# Patient Record
Sex: Female | Born: 1940 | Race: Black or African American | Hispanic: No | Marital: Married | Smoking: Former smoker
Health system: Southern US, Community
[De-identification: ages and names within clinical notes are randomized; demographics above are authoritative.]

## PROBLEM LIST (undated history)

## (undated) DIAGNOSIS — E119 Type 2 diabetes mellitus without complications: Secondary | ICD-10-CM

## (undated) DIAGNOSIS — R809 Proteinuria, unspecified: Secondary | ICD-10-CM

## (undated) DIAGNOSIS — Z923 Personal history of irradiation: Secondary | ICD-10-CM

## (undated) DIAGNOSIS — E7800 Pure hypercholesterolemia, unspecified: Secondary | ICD-10-CM

## (undated) DIAGNOSIS — C50919 Malignant neoplasm of unspecified site of unspecified female breast: Secondary | ICD-10-CM

## (undated) DIAGNOSIS — I10 Essential (primary) hypertension: Secondary | ICD-10-CM

## (undated) DIAGNOSIS — F329 Major depressive disorder, single episode, unspecified: Secondary | ICD-10-CM

## (undated) DIAGNOSIS — Z803 Family history of malignant neoplasm of breast: Secondary | ICD-10-CM

## (undated) DIAGNOSIS — E1140 Type 2 diabetes mellitus with diabetic neuropathy, unspecified: Secondary | ICD-10-CM

## (undated) DIAGNOSIS — Z800 Family history of malignant neoplasm of digestive organs: Secondary | ICD-10-CM

## (undated) DIAGNOSIS — R0600 Dyspnea, unspecified: Secondary | ICD-10-CM

## (undated) DIAGNOSIS — R600 Localized edema: Secondary | ICD-10-CM

## (undated) DIAGNOSIS — C801 Malignant (primary) neoplasm, unspecified: Secondary | ICD-10-CM

## (undated) DIAGNOSIS — M1990 Unspecified osteoarthritis, unspecified site: Secondary | ICD-10-CM

## (undated) DIAGNOSIS — F319 Bipolar disorder, unspecified: Secondary | ICD-10-CM

## (undated) DIAGNOSIS — G4730 Sleep apnea, unspecified: Secondary | ICD-10-CM

## (undated) DIAGNOSIS — F32A Depression, unspecified: Secondary | ICD-10-CM

## (undated) DIAGNOSIS — R609 Edema, unspecified: Secondary | ICD-10-CM

## (undated) HISTORY — DX: Family history of malignant neoplasm of breast: Z80.3

## (undated) HISTORY — DX: Malignant neoplasm of unspecified site of unspecified female breast: C50.919

## (undated) HISTORY — PX: COLONOSCOPY: SHX174

## (undated) HISTORY — DX: Family history of malignant neoplasm of digestive organs: Z80.0

## (undated) HISTORY — PX: KNEE SURGERY: SHX244

## (undated) HISTORY — DX: Bipolar disorder, unspecified: F31.9

## (undated) HISTORY — PX: ABDOMINAL HYSTERECTOMY: SHX81

---

## 2003-12-02 ENCOUNTER — Inpatient Hospital Stay (HOSPITAL_COMMUNITY): Admission: EM | Admit: 2003-12-02 | Discharge: 2003-12-04 | Payer: Self-pay | Admitting: Emergency Medicine

## 2005-08-19 IMAGING — CR DG CHEST 1V PORT
1 series · 1 of 1 positions shown · non-contrast
Comparison: none

CLINICAL DATA: Chest pain and shortness of breath.
 PORTABLE CHEST - 12/02/03 AT 2498 HOURS
 The lungs are clear.   The heart is within upper limits of normal.  No bony abnormality is seen.
 IMPRESSION
 No active lung disease.   Heart upper normal.

[view not recorded]
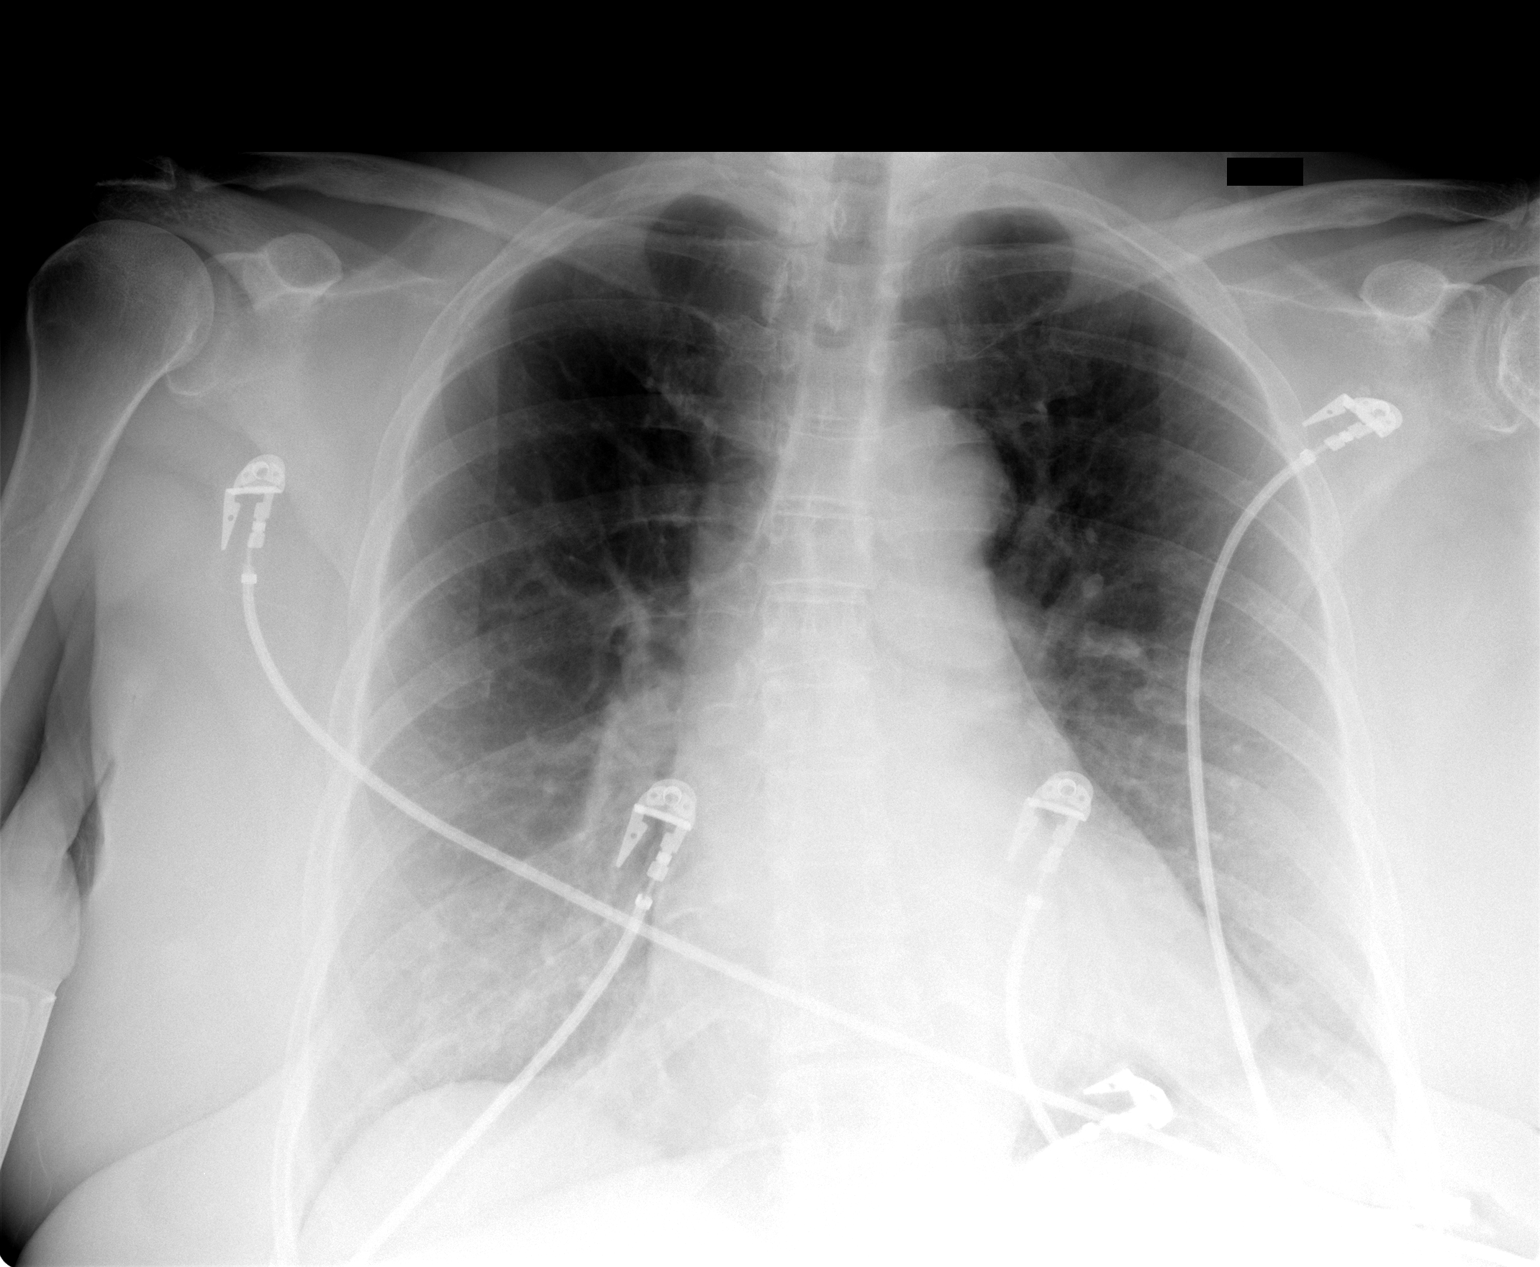

[1 of 1 positions shown; findings below may reference images not displayed]

## 2005-09-30 ENCOUNTER — Emergency Department (HOSPITAL_COMMUNITY): Admission: EM | Admit: 2005-09-30 | Discharge: 2005-09-30 | Payer: Self-pay | Admitting: Family Medicine

## 2012-04-13 ENCOUNTER — Emergency Department (HOSPITAL_BASED_OUTPATIENT_CLINIC_OR_DEPARTMENT_OTHER)
Admission: EM | Admit: 2012-04-13 | Discharge: 2012-04-13 | Disposition: A | Discharge status: Other Acute Inpatient Hospital | Payer: Medicare Other | Attending: Emergency Medicine | Admitting: Emergency Medicine

## 2012-04-13 ENCOUNTER — Emergency Department (HOSPITAL_BASED_OUTPATIENT_CLINIC_OR_DEPARTMENT_OTHER): Payer: Medicare Other

## 2012-04-13 ENCOUNTER — Encounter (HOSPITAL_BASED_OUTPATIENT_CLINIC_OR_DEPARTMENT_OTHER): Payer: Self-pay | Admitting: Emergency Medicine

## 2012-04-13 DIAGNOSIS — Z7982 Long term (current) use of aspirin: Secondary | ICD-10-CM | POA: Insufficient documentation

## 2012-04-13 DIAGNOSIS — R062 Wheezing: Secondary | ICD-10-CM | POA: Insufficient documentation

## 2012-04-13 DIAGNOSIS — I10 Essential (primary) hypertension: Secondary | ICD-10-CM | POA: Insufficient documentation

## 2012-04-13 DIAGNOSIS — E7800 Pure hypercholesterolemia, unspecified: Secondary | ICD-10-CM | POA: Insufficient documentation

## 2012-04-13 DIAGNOSIS — E1142 Type 2 diabetes mellitus with diabetic polyneuropathy: Secondary | ICD-10-CM | POA: Insufficient documentation

## 2012-04-13 DIAGNOSIS — R0789 Other chest pain: Secondary | ICD-10-CM | POA: Insufficient documentation

## 2012-04-13 DIAGNOSIS — R61 Generalized hyperhidrosis: Secondary | ICD-10-CM | POA: Insufficient documentation

## 2012-04-13 DIAGNOSIS — J40 Bronchitis, not specified as acute or chronic: Secondary | ICD-10-CM | POA: Insufficient documentation

## 2012-04-13 DIAGNOSIS — Z8679 Personal history of other diseases of the circulatory system: Secondary | ICD-10-CM | POA: Insufficient documentation

## 2012-04-13 DIAGNOSIS — J029 Acute pharyngitis, unspecified: Secondary | ICD-10-CM | POA: Insufficient documentation

## 2012-04-13 DIAGNOSIS — R5381 Other malaise: Secondary | ICD-10-CM | POA: Insufficient documentation

## 2012-04-13 DIAGNOSIS — R5383 Other fatigue: Secondary | ICD-10-CM | POA: Insufficient documentation

## 2012-04-13 DIAGNOSIS — E1149 Type 2 diabetes mellitus with other diabetic neurological complication: Secondary | ICD-10-CM | POA: Insufficient documentation

## 2012-04-13 DIAGNOSIS — Z79899 Other long term (current) drug therapy: Secondary | ICD-10-CM | POA: Insufficient documentation

## 2012-04-13 DIAGNOSIS — R079 Chest pain, unspecified: Secondary | ICD-10-CM

## 2012-04-13 HISTORY — DX: Type 2 diabetes mellitus with diabetic neuropathy, unspecified: E11.40

## 2012-04-13 HISTORY — DX: Localized edema: R60.0

## 2012-04-13 HISTORY — DX: Depression, unspecified: F32.A

## 2012-04-13 HISTORY — DX: Essential (primary) hypertension: I10

## 2012-04-13 HISTORY — DX: Proteinuria, unspecified: R80.9

## 2012-04-13 HISTORY — DX: Type 2 diabetes mellitus without complications: E11.9

## 2012-04-13 HISTORY — DX: Edema, unspecified: R60.9

## 2012-04-13 HISTORY — DX: Pure hypercholesterolemia, unspecified: E78.00

## 2012-04-13 HISTORY — DX: Major depressive disorder, single episode, unspecified: F32.9

## 2012-04-13 LAB — BASIC METABOLIC PANEL
CO2: 26 mEq/L (ref 19–32)
Creatinine, Ser: 0.7 mg/dL (ref 0.50–1.10)
GFR calc Af Amer: >90 mL/min (ref >90)
GFR calc non Af Amer: 86 mL/min — ABNORMAL LOW (ref >90)
Glucose, Bld: 235 mg/dL — ABNORMAL HIGH (ref 70–99)

## 2012-04-13 LAB — CBC
HCT: 42.5 % (ref 36.0–46.0)
Hemoglobin: 14.2 g/dL (ref 12.0–15.0)
MCH: 28.5 pg (ref 26.0–34.0)
MCV: 85.3 fL (ref 78.0–100.0)
Platelets: 207 10*3/uL (ref 150–400)

## 2012-04-13 LAB — TROPONIN I: Troponin I: <0.3 ng/mL (ref <0.30)

## 2012-04-13 LAB — PRO B NATRIURETIC PEPTIDE: Pro B Natriuretic peptide (BNP): 27.1 pg/mL (ref 0–125)

## 2012-04-13 MED ORDER — DEXTROSE 5 % IV SOLN
1.0000 g | Freq: Once | INTRAVENOUS | Status: AC
  Administered 2012-04-13: 1 g via INTRAVENOUS
  Filled 2012-04-13: qty 10

## 2012-04-13 MED ORDER — SODIUM CHLORIDE 0.9 % IV BOLUS (SEPSIS)
500.0000 mL | Freq: Once | INTRAVENOUS | Status: AC
  Administered 2012-04-13: 500 mL via INTRAVENOUS

## 2012-04-13 MED ORDER — ALBUTEROL SULFATE (5 MG/ML) 0.5% IN NEBU: 5.0000 mg | INHALATION_SOLUTION | Freq: Once | RESPIRATORY_TRACT | Status: DC

## 2012-04-13 MED ORDER — PREDNISONE 50 MG PO TABS
60.0000 mg | ORAL_TABLET | Freq: Once | ORAL | Status: AC
  Administered 2012-04-13: 60 mg via ORAL
  Filled 2012-04-13: qty 1

## 2012-04-13 MED ORDER — NITROGLYCERIN 0.4 MG SL SUBL
0.4000 mg | SUBLINGUAL_TABLET | SUBLINGUAL | Status: DC | PRN
  Filled 2012-04-13: qty 25

## 2012-04-13 MED ORDER — GUAIFENESIN-CODEINE 100-10 MG/5ML PO SYRP: 5.0000 mL | ORAL_SOLUTION | Freq: Three times a day (TID) | ORAL | Status: DC | PRN

## 2012-04-13 MED ORDER — DEXTROSE 5 % IV SOLN
500.0000 mg | Freq: Once | INTRAVENOUS | Status: AC
  Administered 2012-04-13: 500 mg via INTRAVENOUS
  Filled 2012-04-13: qty 500

## 2012-04-13 MED ORDER — AZITHROMYCIN 250 MG PO TABS: 250.0000 mg | ORAL_TABLET | Freq: Every day | ORAL | Status: DC

## 2012-04-13 MED ORDER — ALBUTEROL SULFATE HFA 108 (90 BASE) MCG/ACT IN AERS
2.0000 | INHALATION_SPRAY | RESPIRATORY_TRACT | Status: DC | PRN
  Administered 2012-04-13: 2 via RESPIRATORY_TRACT
  Filled 2012-04-13: qty 6.7

## 2012-04-13 MED ORDER — ASPIRIN 81 MG PO CHEW
162.0000 mg | CHEWABLE_TABLET | Freq: Once | ORAL | Status: AC
  Administered 2012-04-13: 162 mg via ORAL

## 2012-04-13 MED ORDER — ASPIRIN 81 MG PO CHEW
324.0000 mg | CHEWABLE_TABLET | Freq: Once | ORAL | Status: DC
  Filled 2012-04-13: qty 4

## 2012-04-13 MED ORDER — ALBUTEROL SULFATE (5 MG/ML) 0.5% IN NEBU
5.0000 mg | INHALATION_SOLUTION | Freq: Once | RESPIRATORY_TRACT | Status: AC
  Administered 2012-04-13: 5 mg via RESPIRATORY_TRACT
  Filled 2012-04-13: qty 1

## 2012-04-13 NOTE — ED Notes (Signed)
IV Azithromycin continues infusing.

## 2012-04-13 NOTE — ED Provider Notes (Addendum)
History     CSN: [id number]  Arrival date & time 04/13/12  1023   First MD Initiated Contact with Patient 04/13/12 1053      Chief Complaint  Patient presents with  . Chest Pain  . Sore Throat  . Cough    (Consider location/radiation/quality/duration/timing/severity/associated sxs/prior treatment) HPI Comments: Patient presents with tightness across her chest. She states that she's had sort typically throat since yesterday and a dry cough that started yesterday and then when she woke up this morning she had worsening cough. She also felt tight across her chest and felt short of breath. She did have one episode of diaphoresis. She says the pain is more on the left side that feels like a tight across her chest. She says it's a sharp pain when she breathes. This this morning she started coughing up some yellow sputum. She denies any nausea vomiting or diaphoresis. She denies any known fevers but she feels more fatigued today. She does feel achy all over.   Past Medical History  Diagnosis Date  . Diabetes mellitus without complication   . Hypercholesteremia   . Peripheral edema   . Proteinuria   . Diabetic neuropathy   . Depression   . Hypertension     Past Surgical History  Procedure Date  . Knee surgery   . Abdominal hysterectomy   . Colonoscopy     History reviewed. No pertinent family history.  History  Substance Use Topics  . Smoking status: Never Smoker   . Smokeless tobacco: Not on file  . Alcohol Use: No    OB History    Grav Para Term Preterm Abortions TAB SAB Ect Mult Living                  Review of Systems  Constitutional: Positive for diaphoresis and fatigue. Negative for fever and chills.  HENT: Positive for sore throat. Negative for congestion, rhinorrhea and sneezing.   Eyes: Negative.   Respiratory: Positive for cough, chest tightness and wheezing. Negative for shortness of breath.   Cardiovascular: Positive for chest pain. Negative for leg  swelling.  Gastrointestinal: Negative for nausea, vomiting, abdominal pain, diarrhea and blood in stool.  Genitourinary: Negative for frequency, hematuria, flank pain and difficulty urinating.  Musculoskeletal: Negative for back pain and arthralgias.  Skin: Negative for rash.  Neurological: Negative for dizziness, speech difficulty, weakness, numbness and headaches.    Allergies  Review of patient's allergies indicates no known allergies.  Home Medications   Current Outpatient Rx  Name  Route  Sig  Dispense  Refill  . AMANTADINE HCL 100 MG PO CAPS   Oral   Take 100 mg by mouth 2 (two) times daily.         . ASPIRIN 81 MG PO TABS   Oral   Take 81 mg by mouth daily.         [name] CLONAZEPAM 0.5 MG PO TABS   Oral   Take 0.5 mg by mouth at bedtime as needed.         . FUROSEMIDE 40 MG PO TABS   Oral   Take 40 mg by mouth daily.         [name] GABAPENTIN 600 MG PO TABS   Oral   Take 600 mg by mouth 3 (three) times daily.         [name] LISINOPRIL 2.5 MG PO TABS   Oral   Take 2.5 mg by mouth daily.         [name]  METFORMIN HCL 500 MG PO TABS   Oral   Take 500 mg by mouth 2 (two) times daily with a meal.         . MIRTAZAPINE 15 MG PO TABS   Oral   Take 15 mg by mouth at bedtime.         [name] PROPRANOLOL HCL 10 MG PO TABS   Oral   Take 10 mg by mouth 3 (three) times daily.         [name] SIMVASTATIN 40 MG PO TABS   Oral   Take 40 mg by mouth every evening.         [name] ZOLPIDEM TARTRATE 10 MG PO TABS   Oral   Take 10 mg by mouth at bedtime as needed.         . AZITHROMYCIN 250 MG PO TABS   Oral   Take 1 tablet (250 mg total) by mouth daily. Take first 2 tablets together, then 1 every day until finished.   6 tablet   0   . GUAIFENESIN-CODEINE 100-10 MG/5ML PO SYRP   Oral   Take 5 mLs by mouth 3 (three) times daily as needed for cough.   120 mL   0     BP 97/64  Pulse 73  Temp 98.1 F (36.7 C) (Oral)  Resp 18  SpO2 98%  Physical Exam  Constitutional:  She is oriented to person, place, and time. She appears well-developed and well-nourished.  HENT:  Head: Normocephalic and atraumatic.  Right Ear: External ear normal.  Left Ear: External ear normal.  Mouth/Throat: Oropharynx is clear and moist.  Eyes: Pupils are equal, round, and reactive to light.  Neck: Normal range of motion. Neck supple.  Cardiovascular: Normal rate, regular rhythm and normal heart sounds.   Pulmonary/Chest: Effort normal. No respiratory distress. She has wheezes (Mild excretory wheezes on exam). She has no rales. She exhibits tenderness (Positive reproducible tenderness left greater than right).  Abdominal: Soft. Bowel sounds are normal. There is no tenderness. There is no rebound and no guarding.  Musculoskeletal: Normal range of motion. She exhibits no edema.  Lymphadenopathy:    She has no cervical adenopathy.  Neurological: She is alert and oriented to person, place, and time.  Skin: Skin is warm and dry. No rash noted.  Psychiatric: She has a normal mood and affect.    ED Course  Procedures (including critical care time)  Results for orders placed during the hospital encounter of 04/13/12  CBC      Component Value Range   WBC 6.4  4.0 - 10.5 K/uL   RBC 4.98  3.87 - 5.11 MIL/uL   Hemoglobin 14.2  12.0 - 15.0 g/dL   HCT 40.9  81.1 - 91.4 %   MCV 85.3  78.0 - 100.0 fL   MCH 28.5  26.0 - 34.0 pg   MCHC 33.4  30.0 - 36.0 g/dL   RDW 78.2  95.6 - 21.3 %   Platelets 207  150 - 400 K/uL  BASIC METABOLIC PANEL      Component Value Range   Sodium 138  135 - 145 mEq/L   Potassium 4.2  3.5 - 5.1 mEq/L   Chloride 98  96 - 112 mEq/L   CO2 26  19 - 32 mEq/L   Glucose, Bld 235 (*) 70 - 99 mg/dL   BUN 12  6 - 23 mg/dL   Creatinine, Ser 0.86  0.50 - 1.10 mg/dL   Calcium 9.8  8.4 - 10.5 mg/dL   GFR calc non Af Amer 86 (*) >90 mL/min   GFR calc Af Amer >90  >90 mL/min  TROPONIN I      Component Value Range   Troponin I <0.30  <0.30 ng/mL  PRO B NATRIURETIC  PEPTIDE      Component Value Range   Pro B Natriuretic peptide (BNP) 27.1  0 - 125 pg/mL  D-DIMER, QUANTITATIVE      Component Value Range   D-Dimer, Quant 0.46  0.00 - 0.48 ug/mL-FEU  TROPONIN I      Component Value Range   Troponin I <0.30  <0.30 ng/mL  TROPONIN I      Component Value Range   Troponin I <0.30  <0.30 ng/mL   Dg Chest 2 View  04/13/2012  *RADIOLOGY REPORT*  Clinical Data: Cough  CHEST - 2 VIEW  Comparison: 12/02/2003  Findings: Lungs are hypoaerated with bibasilar curvilinear opacities, likely atelectasis.  No pleural effusion.  Heart size mildly enlarged.  No acute osseous finding.  IMPRESSION: Low lung volumes with curvilinear bibasilar airspace opacities, likely atelectasis.  This could obscure underlying pneumonia. If the patient's symptoms continue, consider PA and lateral chest radiographs obtained at full inspiration when the patient is clinically able.   Original Report Authenticated By: Christiana Pellant, M.D.      Dg Chest 2 View  04/13/2012  *RADIOLOGY REPORT*  Clinical Data: Cough  CHEST - 2 VIEW  Comparison: 12/02/2003  Findings: Lungs are hypoaerated with bibasilar curvilinear opacities, likely atelectasis.  No pleural effusion.  Heart size mildly enlarged.  No acute osseous finding.  IMPRESSION: Low lung volumes with curvilinear bibasilar airspace opacities, likely atelectasis.  This could obscure underlying pneumonia. If the patient's symptoms continue, consider PA and lateral chest radiographs obtained at full inspiration when the patient is clinically able.   Original Report Authenticated By: Christiana Pellant, M.D.     Date: 04/13/2012  Rate: 71  Rhythm: normal sinus rhythm  QRS Axis: normal  Intervals: normal  ST/T Wave abnormalities: nonspecific ST/T changes  Conduction Disutrbances:none  Narrative Interpretation:   Old EKG Reviewed: unchanged     1. Bronchitis   2. Chest pain       MDM  Patient with likely bronchitis. Cannot completely exclude  her lower lobe pneumonia based on chest x-ray. I feel her chest pain is related to this. She now says it's worse when she's coughing. I have a low suspicion for acute coronary syndrome. There is no other symptoms suggestive of pulmonary embolus. She states that her breathing does feel better. Her oxygen levels have remained normal. Her blood pressures been running between 90 and 110. She was given some IV fluids and overall is feeling much better. I will give her a prescription for Zithromax as well as cough syrup and she was dispensed an albuterol inhaler to use at home. She is from [place] and I advised her to followup with her doctor within the next 2 days. She is planning on going home tomorrow. Advised to return here if her symptoms worsen.   15:53: On walking patient her oxygen levels into the mid 80s. I will go and give her another albuterol nebulizer treatment as well as IV antibiotics and consult hospitalist for admission to [facility].  15:57 pt has decided that she wants to go to [facility].  Discussed with DR Susie Cassette who has accepted pt   Rolan Bucco, MD 04/13/12 1531  Rolan Bucco, MD 04/13/12 1553  Rolan Bucco,  MD 04/13/12 1557  Rolan Bucco, MD 04/13/12 [id number]

## 2012-04-13 NOTE — ED Notes (Signed)
Pt. States she took 2 baby ASA at home PTA.

## 2012-04-13 NOTE — ED Notes (Signed)
Room 608 assigned and ready.

## 2012-04-13 NOTE — ED Notes (Signed)
No breathing tx given at this time due to Albuterol MDI given.

## 2012-04-13 NOTE — ED Notes (Signed)
Report called to Nedra Hai, RN on [address] [place] at [facility].

## 2012-04-13 NOTE — ED Notes (Signed)
Pt having sore throat and dry cough for a few days.  Woke up this am with chest tightness, difficulty taking a deep breath, and dizziness.  Some diaphoresis, no N/V/D.  No known fever.

## 2012-04-13 NOTE — ED Notes (Signed)
Called [facility] for unassigned at [facility]

## 2013-12-30 IMAGING — CR DG CHEST 2V
2 series · 2 of 2 positions shown · non-contrast
Comparison: 12/02/2003

CLINICAL DATA: Cough

CHEST - 2 VIEW

[w chest pa]
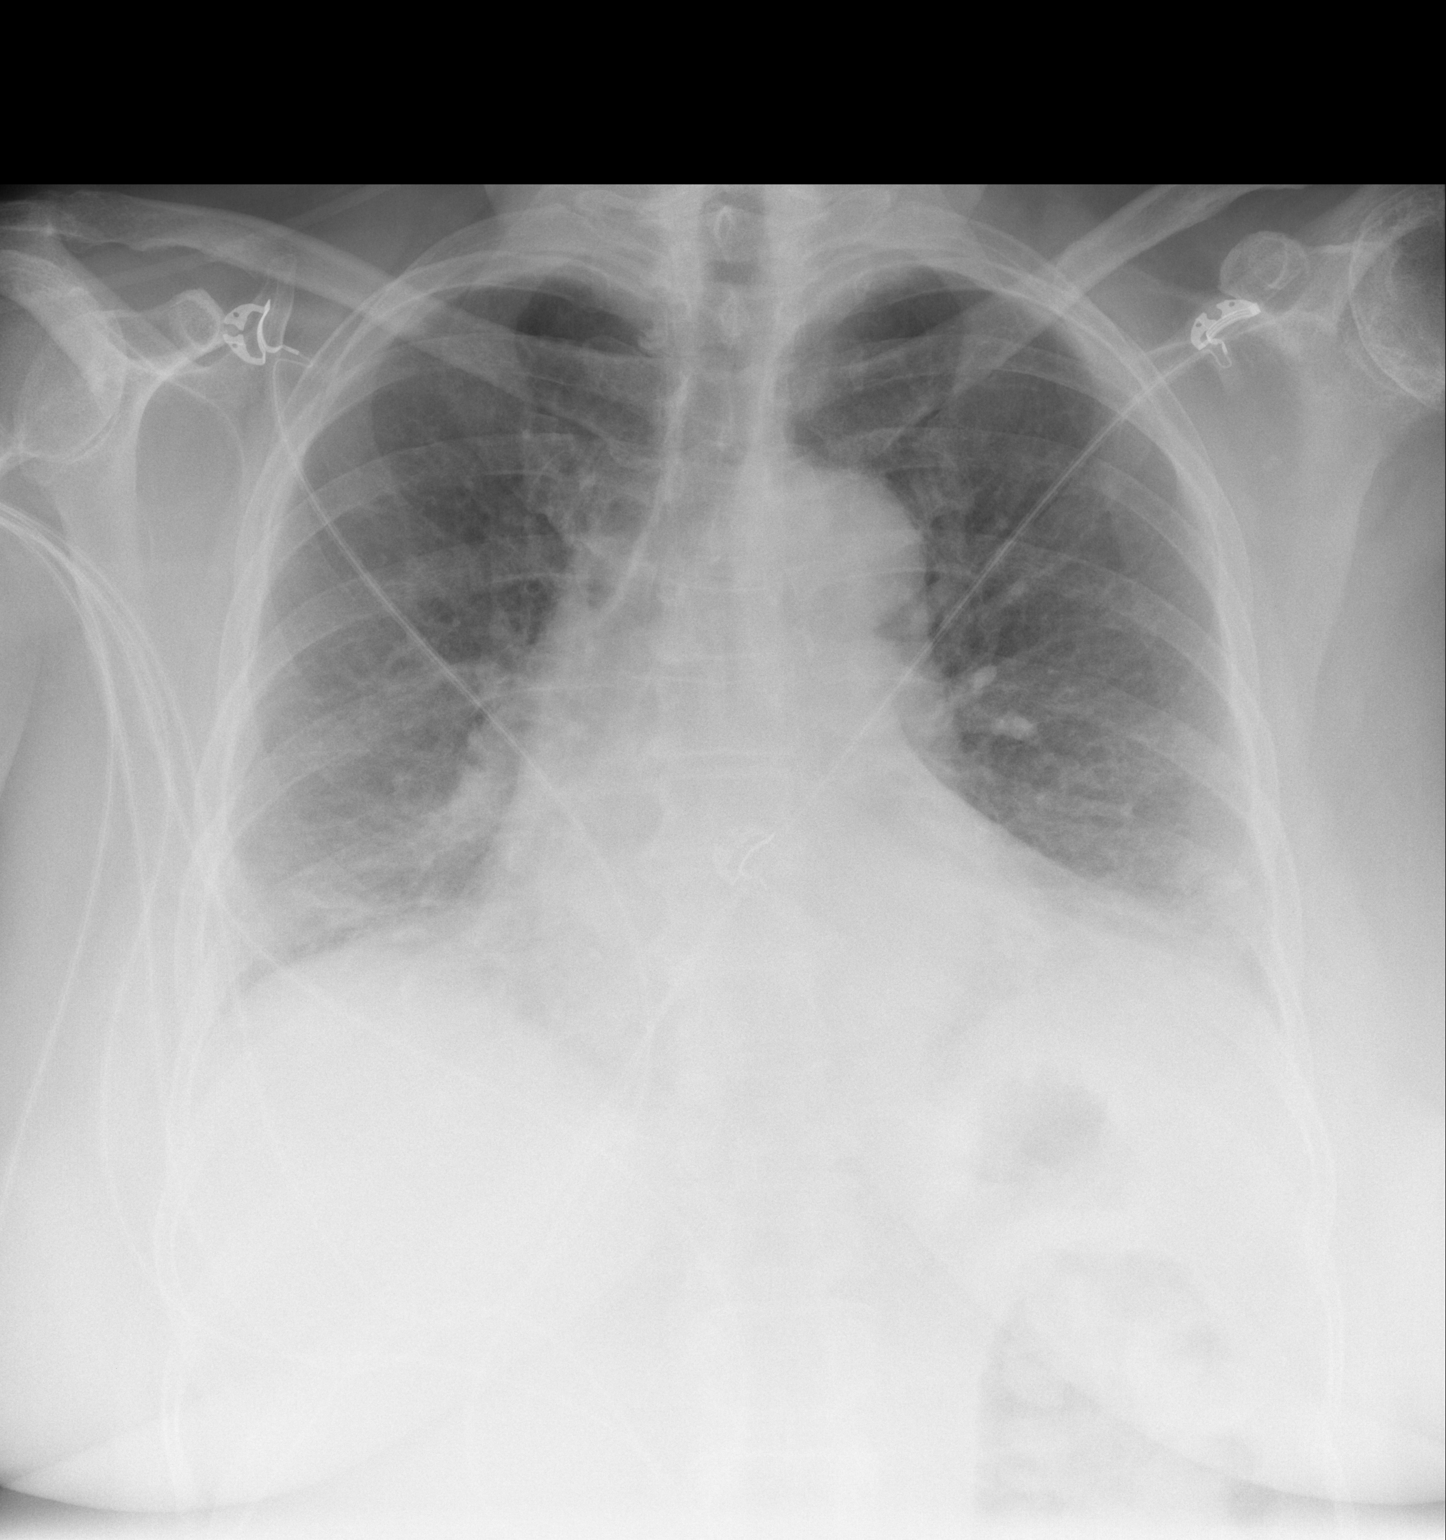

[w chest lat]
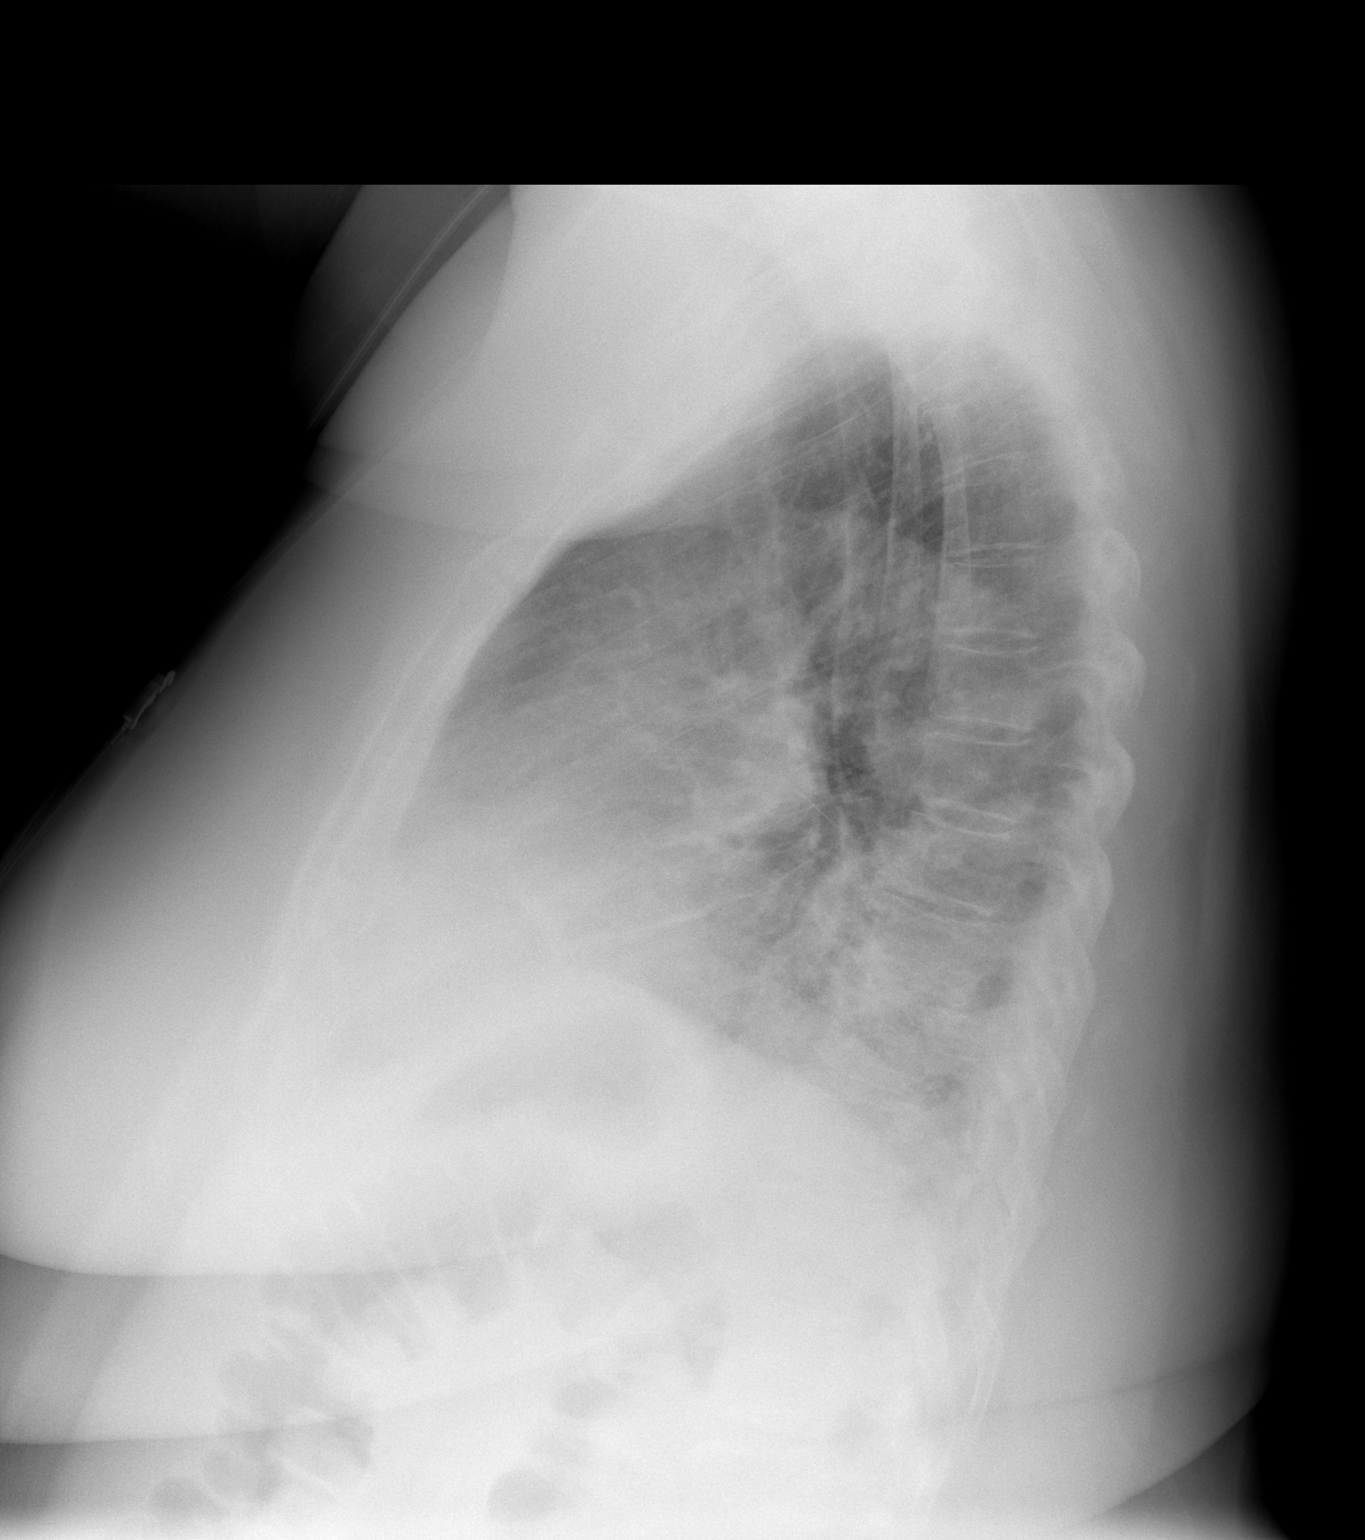

[2 of 2 positions shown; findings below may reference images not displayed]

FINDINGS: Lungs are hypoaerated with bibasilar curvilinear
opacities, likely atelectasis.  No pleural effusion.  Heart size
mildly enlarged.  No acute osseous finding.
IMPRESSION: Low lung volumes with curvilinear bibasilar airspace opacities,
likely atelectasis.  This could obscure underlying pneumonia. If
the patient's symptoms continue, consider PA and lateral chest
radiographs obtained at full inspiration when the patient is
clinically able.

## 2015-04-15 DIAGNOSIS — F331 Major depressive disorder, recurrent, moderate: Secondary | ICD-10-CM | POA: Diagnosis not present

## 2015-04-15 DIAGNOSIS — F29 Unspecified psychosis not due to a substance or known physiological condition: Secondary | ICD-10-CM | POA: Diagnosis not present

## 2015-04-15 DIAGNOSIS — F419 Anxiety disorder, unspecified: Secondary | ICD-10-CM | POA: Diagnosis not present

## 2015-04-24 DIAGNOSIS — M5441 Lumbago with sciatica, right side: Secondary | ICD-10-CM | POA: Diagnosis not present

## 2015-04-24 DIAGNOSIS — R109 Unspecified abdominal pain: Secondary | ICD-10-CM | POA: Diagnosis not present

## 2015-04-24 DIAGNOSIS — M79604 Pain in right leg: Secondary | ICD-10-CM | POA: Diagnosis not present

## 2015-04-24 DIAGNOSIS — F319 Bipolar disorder, unspecified: Secondary | ICD-10-CM | POA: Diagnosis not present

## 2015-04-24 DIAGNOSIS — M79651 Pain in right thigh: Secondary | ICD-10-CM | POA: Diagnosis not present

## 2015-04-24 DIAGNOSIS — M5431 Sciatica, right side: Secondary | ICD-10-CM | POA: Diagnosis not present

## 2015-04-24 DIAGNOSIS — Z79899 Other long term (current) drug therapy: Secondary | ICD-10-CM | POA: Diagnosis not present

## 2015-04-24 DIAGNOSIS — E119 Type 2 diabetes mellitus without complications: Secondary | ICD-10-CM | POA: Diagnosis not present

## 2015-04-27 DIAGNOSIS — R202 Paresthesia of skin: Secondary | ICD-10-CM | POA: Diagnosis not present

## 2015-04-27 DIAGNOSIS — E119 Type 2 diabetes mellitus without complications: Secondary | ICD-10-CM | POA: Diagnosis not present

## 2015-04-27 DIAGNOSIS — M545 Low back pain: Secondary | ICD-10-CM | POA: Diagnosis not present

## 2015-05-11 DIAGNOSIS — M6281 Muscle weakness (generalized): Secondary | ICD-10-CM | POA: Diagnosis not present

## 2015-05-11 DIAGNOSIS — E669 Obesity, unspecified: Secondary | ICD-10-CM | POA: Diagnosis not present

## 2015-05-11 DIAGNOSIS — E119 Type 2 diabetes mellitus without complications: Secondary | ICD-10-CM | POA: Diagnosis not present

## 2015-05-13 DIAGNOSIS — F419 Anxiety disorder, unspecified: Secondary | ICD-10-CM | POA: Diagnosis not present

## 2015-05-13 DIAGNOSIS — F331 Major depressive disorder, recurrent, moderate: Secondary | ICD-10-CM | POA: Diagnosis not present

## 2015-05-13 DIAGNOSIS — F29 Unspecified psychosis not due to a substance or known physiological condition: Secondary | ICD-10-CM | POA: Diagnosis not present

## 2015-05-19 DIAGNOSIS — I10 Essential (primary) hypertension: Secondary | ICD-10-CM | POA: Diagnosis not present

## 2015-05-19 DIAGNOSIS — E669 Obesity, unspecified: Secondary | ICD-10-CM | POA: Diagnosis not present

## 2015-05-19 DIAGNOSIS — M159 Polyosteoarthritis, unspecified: Secondary | ICD-10-CM | POA: Diagnosis not present

## 2015-05-19 DIAGNOSIS — E119 Type 2 diabetes mellitus without complications: Secondary | ICD-10-CM | POA: Diagnosis not present

## 2015-05-24 DIAGNOSIS — M5441 Lumbago with sciatica, right side: Secondary | ICD-10-CM | POA: Diagnosis not present

## 2015-05-24 DIAGNOSIS — M545 Low back pain: Secondary | ICD-10-CM | POA: Diagnosis not present

## 2015-05-25 DIAGNOSIS — R0602 Shortness of breath: Secondary | ICD-10-CM | POA: Diagnosis not present

## 2015-05-31 DIAGNOSIS — M5441 Lumbago with sciatica, right side: Secondary | ICD-10-CM | POA: Diagnosis not present

## 2015-05-31 DIAGNOSIS — M545 Low back pain: Secondary | ICD-10-CM | POA: Diagnosis not present

## 2015-06-02 DIAGNOSIS — M545 Low back pain: Secondary | ICD-10-CM | POA: Diagnosis not present

## 2015-06-02 DIAGNOSIS — M5441 Lumbago with sciatica, right side: Secondary | ICD-10-CM | POA: Diagnosis not present

## 2015-06-06 DIAGNOSIS — M4806 Spinal stenosis, lumbar region: Secondary | ICD-10-CM | POA: Diagnosis not present

## 2015-06-06 DIAGNOSIS — M4310 Spondylolisthesis, site unspecified: Secondary | ICD-10-CM | POA: Diagnosis not present

## 2015-06-06 DIAGNOSIS — M5416 Radiculopathy, lumbar region: Secondary | ICD-10-CM | POA: Diagnosis not present

## 2015-06-07 DIAGNOSIS — M5441 Lumbago with sciatica, right side: Secondary | ICD-10-CM | POA: Diagnosis not present

## 2015-06-07 DIAGNOSIS — M545 Low back pain: Secondary | ICD-10-CM | POA: Diagnosis not present

## 2015-06-08 DIAGNOSIS — E119 Type 2 diabetes mellitus without complications: Secondary | ICD-10-CM | POA: Diagnosis not present

## 2015-06-08 DIAGNOSIS — M4800 Spinal stenosis, site unspecified: Secondary | ICD-10-CM | POA: Diagnosis not present

## 2015-06-08 DIAGNOSIS — M5489 Other dorsalgia: Secondary | ICD-10-CM | POA: Diagnosis not present

## 2015-06-08 DIAGNOSIS — M159 Polyosteoarthritis, unspecified: Secondary | ICD-10-CM | POA: Diagnosis not present

## 2015-06-09 DIAGNOSIS — M545 Low back pain: Secondary | ICD-10-CM | POA: Diagnosis not present

## 2015-06-09 DIAGNOSIS — M5441 Lumbago with sciatica, right side: Secondary | ICD-10-CM | POA: Diagnosis not present

## 2015-06-10 DIAGNOSIS — F419 Anxiety disorder, unspecified: Secondary | ICD-10-CM | POA: Diagnosis not present

## 2015-06-10 DIAGNOSIS — F29 Unspecified psychosis not due to a substance or known physiological condition: Secondary | ICD-10-CM | POA: Diagnosis not present

## 2015-06-10 DIAGNOSIS — F331 Major depressive disorder, recurrent, moderate: Secondary | ICD-10-CM | POA: Diagnosis not present

## 2015-08-05 DIAGNOSIS — F419 Anxiety disorder, unspecified: Secondary | ICD-10-CM | POA: Diagnosis not present

## 2015-08-05 DIAGNOSIS — F333 Major depressive disorder, recurrent, severe with psychotic symptoms: Secondary | ICD-10-CM | POA: Diagnosis not present

## 2015-09-09 DIAGNOSIS — G2401 Drug induced subacute dyskinesia: Secondary | ICD-10-CM | POA: Diagnosis not present

## 2015-09-09 DIAGNOSIS — T43505S Adverse effect of unspecified antipsychotics and neuroleptics, sequela: Secondary | ICD-10-CM | POA: Diagnosis not present

## 2015-09-09 DIAGNOSIS — M5430 Sciatica, unspecified side: Secondary | ICD-10-CM | POA: Diagnosis not present

## 2015-10-28 ENCOUNTER — Ambulatory Visit: Payer: Medicare Other | Admitting: Medical

## 2015-10-28 DIAGNOSIS — F419 Anxiety disorder, unspecified: Secondary | ICD-10-CM | POA: Diagnosis not present

## 2015-10-28 DIAGNOSIS — F333 Major depressive disorder, recurrent, severe with psychotic symptoms: Secondary | ICD-10-CM | POA: Diagnosis not present

## 2015-11-23 ENCOUNTER — Telehealth: Payer: Self-pay | Admitting: Behavioral Health

## 2015-11-23 NOTE — Telephone Encounter (Signed)
Unable to reach patient for Pre-Visit call due to invalid number.

## 2015-11-24 ENCOUNTER — Encounter: Payer: Self-pay | Admitting: Family Medicine

## 2015-11-24 ENCOUNTER — Ambulatory Visit (INDEPENDENT_AMBULATORY_CARE_PROVIDER_SITE_OTHER): Payer: Medicare Other | Admitting: Family Medicine

## 2015-11-24 VITALS — BP 110/68 | HR 84 | Temp 98.5°F | Ht 67.0 in | Wt 269.8 lb

## 2015-11-24 DIAGNOSIS — R609 Edema, unspecified: Secondary | ICD-10-CM | POA: Diagnosis not present

## 2015-11-24 DIAGNOSIS — E1165 Type 2 diabetes mellitus with hyperglycemia: Secondary | ICD-10-CM

## 2015-11-24 DIAGNOSIS — F3170 Bipolar disorder, currently in remission, most recent episode unspecified: Secondary | ICD-10-CM | POA: Insufficient documentation

## 2015-11-24 DIAGNOSIS — R062 Wheezing: Secondary | ICD-10-CM | POA: Insufficient documentation

## 2015-11-24 DIAGNOSIS — IMO0001 Reserved for inherently not codable concepts without codable children: Secondary | ICD-10-CM | POA: Insufficient documentation

## 2015-11-24 DIAGNOSIS — E1142 Type 2 diabetes mellitus with diabetic polyneuropathy: Secondary | ICD-10-CM | POA: Diagnosis not present

## 2015-11-24 DIAGNOSIS — G8929 Other chronic pain: Secondary | ICD-10-CM

## 2015-11-24 DIAGNOSIS — M549 Dorsalgia, unspecified: Secondary | ICD-10-CM | POA: Diagnosis not present

## 2015-11-24 DIAGNOSIS — E118 Type 2 diabetes mellitus with unspecified complications: Secondary | ICD-10-CM

## 2015-11-24 DIAGNOSIS — E785 Hyperlipidemia, unspecified: Secondary | ICD-10-CM

## 2015-11-24 MED ORDER — METFORMIN HCL 500 MG PO TABS: 500.0000 mg | ORAL_TABLET | Freq: Two times a day (BID) | ORAL | 6 refills | Status: DC

## 2015-11-24 MED ORDER — FUROSEMIDE 40 MG PO TABS: 40.0000 mg | ORAL_TABLET | Freq: Every day | ORAL | 6 refills | Status: DC

## 2015-11-24 MED ORDER — SIMVASTATIN 40 MG PO TABS: 40.0000 mg | ORAL_TABLET | Freq: Every evening | ORAL | 6 refills | Status: DC

## 2015-11-24 MED ORDER — ALBUTEROL SULFATE HFA 108 (90 BASE) MCG/ACT IN AERS: 2.0000 | INHALATION_SPRAY | Freq: Four times a day (QID) | RESPIRATORY_TRACT | 6 refills | Status: AC | PRN

## 2015-11-24 NOTE — Patient Instructions (Signed)
Please go to lab for some basic blood tests today I'll be in touch with your results asap I will also refer you to someone to try and help with your back  Please plan to see me in 3-4 months for a recheck

## 2015-11-24 NOTE — Progress Notes (Addendum)
[place] at [facility] [address] [place], [place] [address] [phone]  Date:  11/24/2015   Name:  Rhonda Berg   DOB:  15-Jul-1940   MRN:  [id number]  PCP:  Lamar Blinks, MD    Chief Complaint: Establish Care (Pt here to est care. Pt has ongoing back pain for several years and is getting worse. Pt has several medications that need refills. )   History of Present Illness:  Rhonda Berg is a 75 y.o. very pleasant female patient who presents with the following:  Here today as a new patient- she has been getting her care and living in [place].  They moved here a few months ago as her husband got sick and had several MIs.  They are living with their daughter, and she does not think she will be going home anytime soon. Her husband has been getting his care through [facility] and is improving  History of DM, neuropathy, HTN, chronic back pain  Dx with DM about 3 years ago. It is "up down up down," she is not sure what her most recent A1c looked like.  She is on metformin 500 BID. Stopped taking invokana as it caused numbness in her feet.  She is not taking glimepiride either.  Does not check her blood sugar at home  She needs RF of a few medications today  States that she has had several MRI of her back, and she describes a history degenerative spine disease.  She states that her pain will wax and wane. She does use a cane, and is using tramadol and also gabapentin 600 QID but states that this is "for my legs."    She has seen a back specialist in the past and has tried PT. She states that so far nothing has really helped her as far as her back She does see a psychiatrist for bipolar disorder and anxiety- she will need to find a doctor here in town soon.   She does note some nighttime cough and wheezing- will use an inhaler as needed.   She is a retired Chief Strategy Officer, and her husband is a retired Health and safety inspector.  They met while  in college She has never been a smoker No fever, chills, nausea, vomiting, or rash  There are no active problems to display for this patient.   Past Medical History:  Diagnosis Date  . Depression   . Diabetes mellitus without complication ([place])   . Diabetic neuropathy ([place])   . Hypercholesteremia   . Hypertension   . Peripheral edema   . Proteinuria     Past Surgical History:  Procedure Laterality Date  . ABDOMINAL HYSTERECTOMY    . COLONOSCOPY    . KNEE SURGERY      Social History  Substance Use Topics  . Smoking status: Never Smoker  . Smokeless tobacco: Not on file  . Alcohol use No    No family history on file.  No Known Allergies  Medication list has been reviewed and updated.  Current Outpatient Prescriptions on File Prior to Visit  Medication Sig Dispense Refill  . amantadine (SYMMETREL) 100 MG capsule Take 100 mg by mouth 2 (two) times daily.    [name] aspirin 81 MG tablet Take 81 mg by mouth daily.    [name] azithromycin (ZITHROMAX) 250 MG tablet Take 1 tablet (250 mg total) by mouth daily. Take first 2 tablets together, then 1 every day until finished.  6 tablet 0  . clonazePAM (KLONOPIN) 0.5 MG tablet Take 0.5 mg by mouth at bedtime as needed.    . furosemide (LASIX) 40 MG tablet Take 40 mg by mouth daily.    [name] gabapentin (NEURONTIN) 600 MG tablet Take 600 mg by mouth 3 (three) times daily.    [name] guaiFENesin-codeine (ROBITUSSIN AC) 100-10 MG/5ML syrup Take 5 mLs by mouth 3 (three) times daily as needed for cough. 120 mL 0  . lisinopril (PRINIVIL,ZESTRIL) 2.5 MG tablet Take 2.5 mg by mouth daily.    . metFORMIN (GLUCOPHAGE) 500 MG tablet Take 500 mg by mouth 2 (two) times daily with a meal.    . mirtazapine (REMERON) 15 MG tablet Take 15 mg by mouth at bedtime.    . propranolol (INDERAL) 10 MG tablet Take 10 mg by mouth 3 (three) times daily.    . simvastatin (ZOCOR) 40 MG tablet Take 40 mg by mouth every evening.    . zolpidem (AMBIEN) 10 MG tablet Take 10 mg by  mouth at bedtime as needed.     No current facility-administered medications on file prior to visit.     Review of Systems:  As per HPI- otherwise negative.   Physical Examination: Vitals:   11/24/15 1517  BP: 110/68  Pulse: 84  Temp: 98.5 F (36.9 C)   Vitals:   11/24/15 1517  Weight: 269 lb 12.8 oz (122.4 kg)  Height: 5' 7"  (1.702 m)   Body mass index is 42.26 kg/m. Ideal Body Weight: Weight in (lb) to have BMI = 25: 159.3  GEN: WDWN, NAD, Non-toxic, A & O x 3, obese, looks well HEENT: Atraumatic, Normocephalic. Neck supple. No masses, No LAD.  Bilateral TM wnl, oropharynx normal.  PEERL,EOMI.   Ears and Nose: No external deformity. CV: RRR, No M/G/R. No JVD. No thrill. No extra heart sounds. PULM: CTA B, no wheezes, crackles, rhonchi. No retractions. No resp. distress. No accessory muscle use. ABD: S, NT, ND, +BS. No rebound. No HSM. EXTR: No c/c/e NEURO slow gait, uses a cane PSYCH: Normally interactive. Conversant. Not depressed or anxious appearing.  Calm demeanor.    Assessment and Plan: Controlled type 2 diabetes mellitus with complication, without long-term current use of insulin ([place]) - Plan: metFORMIN (GLUCOPHAGE) 500 MG tablet, simvastatin (ZOCOR) 40 MG tablet, Comprehensive metabolic panel, Hemoglobin A1c  Peripheral edema - Plan: furosemide (LASIX) 40 MG tablet  Diabetic polyneuropathy associated with type 2 diabetes mellitus (HCC)  Chronic back pain - Plan: traMADol (ULTRAM) 50 MG tablet, Ambulatory referral to Physical Medicine Rehab  Wheezing - Plan: VENTOLIN HFA 108 (90 Base) MCG/ACT inhaler, albuterol (PROVENTIL HFA;VENTOLIN HFA) 108 (90 Base) MCG/ACT inhaler  Bipolar affective disorder in remission ([place]) - Plan: QUEtiapine (SEROQUEL) 200 MG tablet, mirtazapine (REMERON) 15 MG tablet  Hyperlipidemia - Plan: simvastatin (ZOCOR) 40 MG tablet   Here today to establish care Will check her A1c and CMP, refilled her metformin and zocor today She  uses lasix for peripheral edema- refilled this, check K level Chronic back pain and neuropathy; continue gabapentin, tramadol.  Will refer her to PM and R for her back pain She uses albuterol as needed for wheezing- will refill this for her today Discussed her psychiatric care.  We do not have an adequate number of psychiatrists in town to meet our needs and new pt appts can take months.  She might consider continuing to drive and see her doc in [place], or might try finding a provider in [place] or  [facility]  Will plan further follow- up pending labs.  Signed Lamar Blinks, MD addnd 8/18.  Called pt to discuss- LMOM, I will try her back  Results for orders placed or performed in visit on 11/24/15  Comprehensive metabolic panel  Result Value Ref Range   Sodium 136 135 - 145 mEq/L   Potassium 4.2 3.5 - 5.1 mEq/L   Chloride 97 96 - 112 mEq/L   CO2 31 19 - 32 mEq/L   Glucose, Bld 351 (H) 70 - 99 mg/dL   BUN 8 6 - 23 mg/dL   Creatinine, Ser 0.98 0.40 - 1.20 mg/dL   Total Bilirubin 0.3 0.2 - 1.2 mg/dL   Alkaline Phosphatase 92 39 - 117 U/L   AST 25 0 - 37 U/L   ALT 22 0 - 35 U/L   Total Protein 7.1 6.0 - 8.3 g/dL   Albumin 4.1 3.5 - 5.2 g/dL   Calcium 9.5 8.4 - 10.5 mg/dL   GFR 71.43 >60.00 mL/min  Hemoglobin A1c  Result Value Ref Range   Hgb A1c MFr Bld 11.5 (H) 4.6 - 6.5 %   Message from Referrals-  Called pt, she is actually thinking of getting home health PT for her husband (not herself) but we are still not clear on what exactly she needs  Dr. Lorelei Pont,    Rhonda Berg's husband is sick and he can't be left alone.  She would like a home health physical therapist    Thank you,    Manuela Schwartz   [phone]- husband cell number, his name is Desma Paganini, DOB  She will let me know when she needs me to do something as far as PT for her Discussed her DM- she likely will need insulin.  She has never used this in the past and does not have a working meter at home. She will require  in office teaching.  Will schedule her to see [place] early next week (she cannot come in this week) to learn insulin pen and practice checking her glucose.  Will ask Norvel Richards to schedule her and will make sure that we have a meter and pen for her to use  02/20/1936

## 2015-11-24 NOTE — Progress Notes (Signed)
Pre visit review using our clinic review tool, if applicable. No additional management support is needed unless otherwise documented below in the visit note. 

## 2015-11-25 LAB — COMPREHENSIVE METABOLIC PANEL
ALK PHOS: 92 U/L (ref 39–117)
ALT: 22 U/L (ref 0–35)
AST: 25 U/L (ref 0–37)
Albumin: 4.1 g/dL (ref 3.5–5.2)
BILIRUBIN TOTAL: 0.3 mg/dL (ref 0.2–1.2)
BUN: 8 mg/dL (ref 6–23)
CO2: 31 meq/L (ref 19–32)
Calcium: 9.5 mg/dL (ref 8.4–10.5)
Chloride: 97 mEq/L (ref 96–112)
Creatinine, Ser: 0.98 mg/dL (ref 0.40–1.20)
GFR: 71.43 mL/min (ref >60.00)
GLUCOSE: 351 mg/dL — AB (ref 70–99)
Potassium: 4.2 mEq/L (ref 3.5–5.1)
SODIUM: 136 meq/L (ref 135–145)
TOTAL PROTEIN: 7.1 g/dL (ref 6.0–8.3)

## 2015-11-25 LAB — HEMOGLOBIN A1C: Hgb A1c MFr Bld: 11.5 % — ABNORMAL HIGH (ref 4.6–6.5)

## 2015-11-29 ENCOUNTER — Encounter: Payer: Self-pay | Admitting: Family Medicine

## 2015-11-30 ENCOUNTER — Telehealth: Payer: Self-pay | Admitting: Emergency Medicine

## 2015-11-30 NOTE — Telephone Encounter (Signed)
Tried to contact pt to schedule appt for next week. Pt was at the hospital with her husband and said that she would call back to schedule her appt.

## 2015-12-06 ENCOUNTER — Other Ambulatory Visit: Payer: Self-pay | Admitting: Family Medicine

## 2015-12-06 DIAGNOSIS — M5136 Other intervertebral disc degeneration, lumbar region: Secondary | ICD-10-CM

## 2015-12-06 NOTE — Progress Notes (Signed)
Called pt- she was doing PT for her back before she moved to [place] from [place].  She would like to restart this treatment but would need a therapist to come to her home as she cannot leave her husband alone. Will make this referral for her and also reminded her that we do need to get her in as soon as she is able to start her on insulin for her very uncontrolled DM. I have an insulin pen and a meter set aside for her so we can do training.  She will try to come in soon

## 2015-12-08 ENCOUNTER — Telehealth: Payer: Self-pay | Admitting: Family Medicine

## 2015-12-08 DIAGNOSIS — M5136 Other intervertebral disc degeneration, lumbar region: Secondary | ICD-10-CM | POA: Diagnosis not present

## 2015-12-08 DIAGNOSIS — F3170 Bipolar disorder, currently in remission, most recent episode unspecified: Secondary | ICD-10-CM | POA: Diagnosis not present

## 2015-12-08 DIAGNOSIS — Z7984 Long term (current) use of oral hypoglycemic drugs: Secondary | ICD-10-CM | POA: Diagnosis not present

## 2015-12-08 DIAGNOSIS — I10 Essential (primary) hypertension: Secondary | ICD-10-CM | POA: Diagnosis not present

## 2015-12-08 DIAGNOSIS — M545 Low back pain: Secondary | ICD-10-CM | POA: Diagnosis not present

## 2015-12-08 DIAGNOSIS — Z7982 Long term (current) use of aspirin: Secondary | ICD-10-CM | POA: Diagnosis not present

## 2015-12-08 DIAGNOSIS — E1140 Type 2 diabetes mellitus with diabetic neuropathy, unspecified: Secondary | ICD-10-CM | POA: Diagnosis not present

## 2015-12-08 DIAGNOSIS — Z96651 Presence of right artificial knee joint: Secondary | ICD-10-CM | POA: Diagnosis not present

## 2015-12-08 DIAGNOSIS — Z96652 Presence of left artificial knee joint: Secondary | ICD-10-CM | POA: Diagnosis not present

## 2015-12-08 NOTE — Telephone Encounter (Signed)
Caller name: Deidie Relation to pt: PT from [place]  Call back number: [phone] Pharmacy:  Reason for call:  Requesting verbal orders for PT 2x for 4 weeks beginning 12/11/15.

## 2015-12-08 NOTE — Telephone Encounter (Signed)
Called and gave VO 

## 2015-12-14 DIAGNOSIS — Z96651 Presence of right artificial knee joint: Secondary | ICD-10-CM | POA: Diagnosis not present

## 2015-12-14 DIAGNOSIS — I10 Essential (primary) hypertension: Secondary | ICD-10-CM | POA: Diagnosis not present

## 2015-12-14 DIAGNOSIS — F3170 Bipolar disorder, currently in remission, most recent episode unspecified: Secondary | ICD-10-CM | POA: Diagnosis not present

## 2015-12-14 DIAGNOSIS — M545 Low back pain: Secondary | ICD-10-CM | POA: Diagnosis not present

## 2015-12-14 DIAGNOSIS — M5136 Other intervertebral disc degeneration, lumbar region: Secondary | ICD-10-CM | POA: Diagnosis not present

## 2015-12-14 DIAGNOSIS — E1140 Type 2 diabetes mellitus with diabetic neuropathy, unspecified: Secondary | ICD-10-CM | POA: Diagnosis not present

## 2015-12-16 DIAGNOSIS — Z96651 Presence of right artificial knee joint: Secondary | ICD-10-CM | POA: Diagnosis not present

## 2015-12-16 DIAGNOSIS — M5136 Other intervertebral disc degeneration, lumbar region: Secondary | ICD-10-CM | POA: Diagnosis not present

## 2015-12-16 DIAGNOSIS — M545 Low back pain: Secondary | ICD-10-CM | POA: Diagnosis not present

## 2015-12-16 DIAGNOSIS — I10 Essential (primary) hypertension: Secondary | ICD-10-CM | POA: Diagnosis not present

## 2015-12-16 DIAGNOSIS — E1140 Type 2 diabetes mellitus with diabetic neuropathy, unspecified: Secondary | ICD-10-CM | POA: Diagnosis not present

## 2015-12-16 DIAGNOSIS — F3170 Bipolar disorder, currently in remission, most recent episode unspecified: Secondary | ICD-10-CM | POA: Diagnosis not present

## 2015-12-19 DIAGNOSIS — I10 Essential (primary) hypertension: Secondary | ICD-10-CM | POA: Diagnosis not present

## 2015-12-19 DIAGNOSIS — E1140 Type 2 diabetes mellitus with diabetic neuropathy, unspecified: Secondary | ICD-10-CM | POA: Diagnosis not present

## 2015-12-19 DIAGNOSIS — Z96651 Presence of right artificial knee joint: Secondary | ICD-10-CM | POA: Diagnosis not present

## 2015-12-19 DIAGNOSIS — M5136 Other intervertebral disc degeneration, lumbar region: Secondary | ICD-10-CM | POA: Diagnosis not present

## 2015-12-19 DIAGNOSIS — F3170 Bipolar disorder, currently in remission, most recent episode unspecified: Secondary | ICD-10-CM | POA: Diagnosis not present

## 2015-12-19 DIAGNOSIS — M545 Low back pain: Secondary | ICD-10-CM | POA: Diagnosis not present

## 2015-12-21 ENCOUNTER — Telehealth: Payer: Self-pay | Admitting: Family Medicine

## 2015-12-21 ENCOUNTER — Ambulatory Visit (INDEPENDENT_AMBULATORY_CARE_PROVIDER_SITE_OTHER): Payer: Medicare Other | Admitting: Family Medicine

## 2015-12-21 VITALS — BP 114/80 | HR 86 | Temp 98.4°F | Ht 67.0 in | Wt 264.8 lb

## 2015-12-21 DIAGNOSIS — N63 Unspecified lump in unspecified breast: Secondary | ICD-10-CM

## 2015-12-21 DIAGNOSIS — E118 Type 2 diabetes mellitus with unspecified complications: Secondary | ICD-10-CM

## 2015-12-21 DIAGNOSIS — Z794 Long term (current) use of insulin: Secondary | ICD-10-CM | POA: Diagnosis not present

## 2015-12-21 DIAGNOSIS — Z711 Person with feared health complaint in whom no diagnosis is made: Secondary | ICD-10-CM | POA: Diagnosis not present

## 2015-12-21 MED ORDER — BASAGLAR KWIKPEN 100 UNIT/ML ~~LOC~~ SOPN: 10.0000 [IU] | PEN_INJECTOR | Freq: Every day | SUBCUTANEOUS | 0 refills | Status: DC

## 2015-12-21 MED ORDER — BLOOD GLUCOSE MONITORING SUPPL MISC: 3 refills | Status: DC

## 2015-12-21 MED ORDER — PEN NEEDLES 32G X 5 MM MISC
1.0000 | Freq: Every day | 3 refills | Status: DC
Start: 2015-12-21 — End: 2016-01-31

## 2015-12-21 NOTE — Patient Instructions (Addendum)
I am going to set you up for a special mammogram to evaluate your breast concern  Please use your glucose meter to check your blood sugar in the morning prior to eating. We will use this number to adjust your insulin We started you on once a day insulin today, brand name basaglar Start with 10 units once a day. As long as your morning blood sugar reading is more than 150, we will increase your insulin dose by 2 units every 2 days 10 units today and tomorrow, then 12 units for 2 days, then 14 units for 2 days, and so on. When you fasting morning sugar is about 150 we will stick with that dose of medication!    Please call me in one week with an update.   Please give your neurologist a call about your back pain- they may be able to adjust one of your medications to help with the pain

## 2015-12-21 NOTE — Progress Notes (Signed)
[place] at [facility] [address] [place], [place] [address] [phone]  Date:  12/21/2015   Name:  Rhonda Berg   DOB:  June 19, 1940   MRN:  [id number]  PCP:  Lamar Blinks, MD    Chief Complaint: Breast Mass (c/o noticing breast knot on both breast. pt states that she first noticed over this past weekend. )   History of Present Illness:  Rhonda Berg is a 75 y.o. very pleasant female patient who presents with the following:  Here today with a breast concern.  Over this past weekend she noted a hard area in both the left and right breasts that she thinks are new.  The areas are not tender, she just happened to notice them and was worried Last mammo: she does annually.  She thinks her last was done about one year ago-this would have been in [place]  Last seen by myself in August of this year a a new patient, she had recently moved to this area from [place] due to her husband's illness. They moved to be closer to family. At that time she was using metformin only and not checking her glucose- found to have very uncontrolled DM as below.  She has not been able to get in to see [place] until now She does not have a meter and has not been checking her glucose at home but she is willing to do so.  She is also willing to start on insulin which will likely be necessary to gain control of her DM  She sees neurology for her chronic back pain- they have her on tramadol but it does not seem to be helping.    She declines a flu shot, "I've never had a cold in my life."   Lab Results  Component Value Date   HGBA1C 11.5 (H) 11/24/2015     Patient Active Problem List   Diagnosis Date Noted  . Controlled type 2 diabetes mellitus with complication, without long-term current use of insulin ([place]) 11/24/2015  . Peripheral edema 11/24/2015  . Diabetic polyneuropathy associated with type 2 diabetes mellitus ([place]) 11/24/2015  . Chronic back  pain 11/24/2015  . Bipolar affective disorder in remission ([place]) 11/24/2015  . Hyperlipidemia 11/24/2015  . Wheezing 11/24/2015    Past Medical History:  Diagnosis Date  . Depression   . Diabetes mellitus without complication ([place])   . Diabetic neuropathy ([place])   . Hypercholesteremia   . Hypertension   . Peripheral edema   . Proteinuria     Past Surgical History:  Procedure Laterality Date  . ABDOMINAL HYSTERECTOMY    . COLONOSCOPY    . KNEE SURGERY      Social History  Substance Use Topics  . Smoking status: Never Smoker  . Smokeless tobacco: Not on file  . Alcohol use No    No family history on file.  No Known Allergies  Medication list has been reviewed and updated.  Current Outpatient Prescriptions on File Prior to Visit  Medication Sig Dispense Refill  . albuterol (PROVENTIL HFA;VENTOLIN HFA) 108 (90 Base) MCG/ACT inhaler Inhale 2 puffs into the lungs every 6 (six) hours as needed for wheezing or shortness of breath. 1 Inhaler 6  . amantadine (SYMMETREL) 100 MG capsule Take 100 mg by mouth 2 (two) times daily.    [name] aspirin 81 MG tablet Take 81 mg by mouth daily.    . clonazePAM (KLONOPIN) 1 MG tablet Take  1 mg by mouth at bedtime.    . furosemide (LASIX) 40 MG tablet Take 1 tablet (40 mg total) by mouth daily. 30 tablet 6  . gabapentin (NEURONTIN) 600 MG tablet Take 600 mg by mouth 4 (four) times daily.     . metFORMIN (GLUCOPHAGE) 500 MG tablet Take 1 tablet (500 mg total) by mouth 2 (two) times daily with a meal. 60 tablet 6  . mirtazapine (REMERON) 15 MG tablet Take 15 mg by mouth at bedtime.    . propranolol (INDERAL) 10 MG tablet Take 10 mg by mouth 2 (two) times daily.     . QUEtiapine (SEROQUEL) 200 MG tablet Take 1 tablet by mouth at bedtime.    . simvastatin (ZOCOR) 40 MG tablet Take 1 tablet (40 mg total) by mouth every evening. 30 tablet 6  . traMADol (ULTRAM) 50 MG tablet Take 1 tablet by mouth daily.    . VENTOLIN HFA 108 (90 Base) MCG/ACT inhaler  Inhale 2 puffs into the lungs every 6 (six) hours.     No current facility-administered medications on file prior to visit.     Review of Systems:  As per HPI- otherwise negative.   Physical Examination: Vitals:   12/21/15 1015  BP: 114/80  Pulse: 86  Temp: 98.4 F (36.9 C)   Vitals:   12/21/15 1015  Weight: 264 lb 12.8 oz (120.1 kg)  Height: 5\' 7"  (1.702 m)   Body mass index is 41.47 kg/m. Ideal Body Weight: Weight in (lb) to have BMI = 25: 159.3  GEN: WDWN, NAD, Non-toxic, A & O x 3, obese, looks well HEENT: Atraumatic, Normocephalic. Neck supple. No masses, No LAD.  Bilateral TM wnl, oropharynx normal.  PEERL,EOMI.   Ears and Nose: No external deformity. CV: RRR, No M/G/R. No JVD. No thrill. No extra heart sounds. PULM: CTA B, no wheezes, crackles, rhonchi. No retractions. No resp. distress. No accessory muscle use. ABD: S, NT, ND. No rebound. No HSM. EXTR: No c/c/e NEURO Normal gait.  PSYCH: Normally interactive. Conversant. Not depressed or anxious appearing.  Calm demeanor.  Breast: normal exam, no masses/ dimpling/ discharge Pt notes area of concern on Right breast at 4:00 and symetrically Left breast at 7:00, but I do not appreciate any abnl here.    Tested glucose for pt today with her new meter that I gave her- glucose of 362  Did teaching with insulin pen for pt today and also teaching with glucose meter.  Gave her 10 units of insulin   Assessment and Plan: Controlled type 2 diabetes mellitus with complication, with long-term current use of insulin ([place]) - Plan: POCT Glucose (Device for Home Use), Insulin Glargine (BASAGLAR KWIKPEN) 100 UNIT/ML SOPN, Blood Glucose Monitoring Suppl MISC, Insulin Pen Needle (PEN NEEDLES) 32G X 5 MM MISC  Concern about breast cancer in female without diagnosis - Plan: MM DIAG BREAST TOMO BILATERAL  Breast mass - Plan: MM DIAG BREAST TOMO BILATERAL  Here today to follow-up on uncontrolled DM.  Provided a glucose meter and 2  sample pens of basaglar for her today, started her on 10 units of insulin. We will plan to increase by 2 units every 2 days  I am going to set you up for a special mammogram to evaluate your breast concern  Please use your glucose meter to check your blood sugar in the morning prior to eating. We will use this number to adjust your insulin We started you on once a day insulin today, brand  name basaglar Start with 10 units once a day. As long as your morning blood sugar reading is more than 150, we will increase your insulin dose by 2 units every 2 days 10 units today and tomorrow, then 12 units for 2 days, then 14 units for 2 days, and so on. When you fasting morning sugar is about 150 we will stick with that dose of medication!    Please call me in one week with an update. Pt did agree to call me   Please give your neurologist a call about your back pain- they may be able to adjust one of your medications to help with the pain   Spent over 45 min face to face with pt today. Over 50% in counseling  Asked her to please follow-up with me in 2 months  Signed Lamar Blinks, MD

## 2015-12-21 NOTE — Telephone Encounter (Signed)
Caller name: Deidra Relationship to patient: Nanine Means [facility] Can be reached: [phone] Pharmacy:  Reason for call: Patient refused her PT for today. Patient stated that she was too tired and didn't want to do it. States she will resume next week

## 2015-12-22 LAB — POCT GLUCOSE (DEVICE FOR HOME USE): POC GLUCOSE: 362 mg/dL — AB (ref 70–99)

## 2015-12-23 DIAGNOSIS — F419 Anxiety disorder, unspecified: Secondary | ICD-10-CM | POA: Diagnosis not present

## 2015-12-23 DIAGNOSIS — F333 Major depressive disorder, recurrent, severe with psychotic symptoms: Secondary | ICD-10-CM | POA: Diagnosis not present

## 2015-12-26 NOTE — Telephone Encounter (Signed)
Called to check on her-LMOM. Please let me know how she is doing with the insulin shots

## 2015-12-27 ENCOUNTER — Telehealth: Payer: Self-pay | Admitting: Family Medicine

## 2015-12-27 DIAGNOSIS — M5136 Other intervertebral disc degeneration, lumbar region: Secondary | ICD-10-CM | POA: Diagnosis not present

## 2015-12-27 DIAGNOSIS — Z96651 Presence of right artificial knee joint: Secondary | ICD-10-CM | POA: Diagnosis not present

## 2015-12-27 DIAGNOSIS — F3170 Bipolar disorder, currently in remission, most recent episode unspecified: Secondary | ICD-10-CM | POA: Diagnosis not present

## 2015-12-27 DIAGNOSIS — E1140 Type 2 diabetes mellitus with diabetic neuropathy, unspecified: Secondary | ICD-10-CM | POA: Diagnosis not present

## 2015-12-27 DIAGNOSIS — I10 Essential (primary) hypertension: Secondary | ICD-10-CM | POA: Diagnosis not present

## 2015-12-27 DIAGNOSIS — M545 Low back pain: Secondary | ICD-10-CM | POA: Diagnosis not present

## 2015-12-27 NOTE — Telephone Encounter (Signed)
Caller name: Diedra Relationship to patient: [facility] Can be reached: [phone] Pharmacy:  Reason for call: Patient has been complaining of an upset stomach since Friday, Patient thinks it has something to do with the insulin. Plse adv

## 2015-12-28 ENCOUNTER — Other Ambulatory Visit: Payer: Self-pay | Admitting: Emergency Medicine

## 2015-12-28 ENCOUNTER — Telehealth: Payer: Self-pay | Admitting: Family Medicine

## 2015-12-28 NOTE — Telephone Encounter (Signed)
Caller name: Relationship to patient: Self Can be reached: [phone]  Pharmacy:  [id number], [place] [phone] (Phone) [phone] (Fax)     Reason for call: Please call in rx for test strips

## 2015-12-28 NOTE — Telephone Encounter (Signed)
Called her back- she states that her drug store did not have an rx for her test strips. She states that she is actually doing ok with her insulin and GI upset is getting better Called pharmacy to get her test strips and lancets filled.  There may be a form for me to fill out that is fine- gave them fax number

## 2015-12-29 DIAGNOSIS — Z96651 Presence of right artificial knee joint: Secondary | ICD-10-CM | POA: Diagnosis not present

## 2015-12-29 DIAGNOSIS — I10 Essential (primary) hypertension: Secondary | ICD-10-CM | POA: Diagnosis not present

## 2015-12-29 DIAGNOSIS — F3170 Bipolar disorder, currently in remission, most recent episode unspecified: Secondary | ICD-10-CM | POA: Diagnosis not present

## 2015-12-29 DIAGNOSIS — E1140 Type 2 diabetes mellitus with diabetic neuropathy, unspecified: Secondary | ICD-10-CM | POA: Diagnosis not present

## 2015-12-29 DIAGNOSIS — M545 Low back pain: Secondary | ICD-10-CM | POA: Diagnosis not present

## 2015-12-29 DIAGNOSIS — M5136 Other intervertebral disc degeneration, lumbar region: Secondary | ICD-10-CM | POA: Diagnosis not present

## 2015-12-29 NOTE — Telephone Encounter (Signed)
Called Pharmacy, per pharmacy we must fill out DWO form in order for insurance to pay for refills on strips and lancets. Form was received, filled out and faxed back.

## 2015-12-30 ENCOUNTER — Telehealth: Payer: Self-pay

## 2015-12-30 NOTE — Telephone Encounter (Signed)
Received order via fax from [facility] for [phone].  Order numbered and placed in Dr. Lillie Fragmin red folder for review and signature.

## 2016-01-04 ENCOUNTER — Other Ambulatory Visit: Payer: Self-pay | Admitting: Family Medicine

## 2016-01-04 DIAGNOSIS — Z711 Person with feared health complaint in whom no diagnosis is made: Secondary | ICD-10-CM

## 2016-01-04 DIAGNOSIS — N63 Unspecified lump in unspecified breast: Secondary | ICD-10-CM

## 2016-01-04 NOTE — Telephone Encounter (Signed)
Patient called to follow up on Rx for Test Strips and Lancets. States she is not sure if provider wanted her to continue checking her BS. If she is to continue please send order for lancets and test strips to [facility] on [address]. Plse adv

## 2016-01-05 ENCOUNTER — Other Ambulatory Visit: Payer: Self-pay | Admitting: Emergency Medicine

## 2016-01-05 DIAGNOSIS — Z96651 Presence of right artificial knee joint: Secondary | ICD-10-CM | POA: Diagnosis not present

## 2016-01-05 DIAGNOSIS — M545 Low back pain: Secondary | ICD-10-CM | POA: Diagnosis not present

## 2016-01-05 DIAGNOSIS — E1140 Type 2 diabetes mellitus with diabetic neuropathy, unspecified: Secondary | ICD-10-CM | POA: Diagnosis not present

## 2016-01-05 DIAGNOSIS — F3170 Bipolar disorder, currently in remission, most recent episode unspecified: Secondary | ICD-10-CM | POA: Diagnosis not present

## 2016-01-05 DIAGNOSIS — M5136 Other intervertebral disc degeneration, lumbar region: Secondary | ICD-10-CM | POA: Diagnosis not present

## 2016-01-05 DIAGNOSIS — I10 Essential (primary) hypertension: Secondary | ICD-10-CM | POA: Diagnosis not present

## 2016-01-05 NOTE — Telephone Encounter (Signed)
Faxed DWO form to [facility] again.

## 2016-01-09 DIAGNOSIS — M545 Low back pain: Secondary | ICD-10-CM | POA: Diagnosis not present

## 2016-01-09 DIAGNOSIS — M5136 Other intervertebral disc degeneration, lumbar region: Secondary | ICD-10-CM | POA: Diagnosis not present

## 2016-01-09 DIAGNOSIS — E1140 Type 2 diabetes mellitus with diabetic neuropathy, unspecified: Secondary | ICD-10-CM | POA: Diagnosis not present

## 2016-01-09 DIAGNOSIS — F3170 Bipolar disorder, currently in remission, most recent episode unspecified: Secondary | ICD-10-CM | POA: Diagnosis not present

## 2016-01-09 DIAGNOSIS — Z96651 Presence of right artificial knee joint: Secondary | ICD-10-CM | POA: Diagnosis not present

## 2016-01-09 DIAGNOSIS — I10 Essential (primary) hypertension: Secondary | ICD-10-CM | POA: Diagnosis not present

## 2016-01-11 ENCOUNTER — Ambulatory Visit
Admission: RE | Admit: 2016-01-11 | Discharge: 2016-01-11 | Disposition: A | Discharge status: Home / Self Care | Payer: Medicare Other | Source: Ambulatory Visit | Attending: Family Medicine | Admitting: Family Medicine

## 2016-01-11 ENCOUNTER — Other Ambulatory Visit: Payer: Self-pay | Admitting: Family Medicine

## 2016-01-11 DIAGNOSIS — N63 Unspecified lump in unspecified breast: Secondary | ICD-10-CM

## 2016-01-11 DIAGNOSIS — Z711 Person with feared health complaint in whom no diagnosis is made: Secondary | ICD-10-CM

## 2016-01-11 DIAGNOSIS — N6313 Unspecified lump in the right breast, lower outer quadrant: Secondary | ICD-10-CM | POA: Diagnosis not present

## 2016-01-11 DIAGNOSIS — N6310 Unspecified lump in the right breast, unspecified quadrant: Secondary | ICD-10-CM | POA: Diagnosis not present

## 2016-01-11 DIAGNOSIS — N6320 Unspecified lump in the left breast, unspecified quadrant: Secondary | ICD-10-CM | POA: Diagnosis not present

## 2016-01-13 ENCOUNTER — Ambulatory Visit
Admission: RE | Admit: 2016-01-13 | Discharge: 2016-01-13 | Disposition: A | Discharge status: Home / Self Care | Payer: Medicare Other | Source: Ambulatory Visit | Attending: Family Medicine | Admitting: Family Medicine

## 2016-01-13 ENCOUNTER — Other Ambulatory Visit: Payer: Self-pay | Admitting: Family Medicine

## 2016-01-13 DIAGNOSIS — Z711 Person with feared health complaint in whom no diagnosis is made: Secondary | ICD-10-CM

## 2016-01-13 DIAGNOSIS — Z170 Estrogen receptor positive status [ER+]: Secondary | ICD-10-CM | POA: Diagnosis not present

## 2016-01-13 DIAGNOSIS — N63 Unspecified lump in unspecified breast: Secondary | ICD-10-CM

## 2016-01-13 DIAGNOSIS — C50511 Malignant neoplasm of lower-outer quadrant of right female breast: Secondary | ICD-10-CM | POA: Diagnosis not present

## 2016-01-13 DIAGNOSIS — N6313 Unspecified lump in the right breast, lower outer quadrant: Secondary | ICD-10-CM | POA: Diagnosis not present

## 2016-01-23 ENCOUNTER — Ambulatory Visit: Payer: Self-pay | Admitting: Surgery

## 2016-01-23 DIAGNOSIS — Z170 Estrogen receptor positive status [ER+]: Secondary | ICD-10-CM | POA: Diagnosis not present

## 2016-01-23 DIAGNOSIS — C50511 Malignant neoplasm of lower-outer quadrant of right female breast: Secondary | ICD-10-CM | POA: Diagnosis not present

## 2016-01-23 DIAGNOSIS — C50911 Malignant neoplasm of unspecified site of right female breast: Secondary | ICD-10-CM

## 2016-01-23 NOTE — H&P (Signed)
Rhonda Berg 01/23/2016 9:19 AM Location: [facility] Patient #: [id number] DOB: 02-17-1941 Married / Language: English / Race: Black or African American Female  History of Present Illness Marcello Moores A. Masashi Snowdon MD; 01/23/2016 10:12 AM) Patient words: Patient sent at the request of Dr. Edilia Bo for right breast mass. Patient was noted to have a right breast mass last month. She underwent mammogram with ultrasound and was found to have 2 small abnormal masses at 8:00 in the right breast just under the nipple. 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 Patient noticed a lump last month. Denies nipple discharge. She states there is a family history of breast cancer. She is sore bruise from her biopsy.                              CLINICAL DATA: Patient with multiple right breast masses, status post 2 ultrasound-guided biopsies today. EXAM: DIAGNOSTIC RIGHT MAMMOGRAM POST ULTRASOUND BIOPSY (2 biopsies) COMPARISON: Previous exam(s). FINDINGS: Mammographic images were obtained following ultrasound guided biopsy of suspicious masses within the right breast at the 8 o'clock axis, 1 cm from the nipple, and at the 8 o'clock axis, 2 cm from the nipple. After biopsy at the 8 o'clock axis, 1 cm from nipple, a ribbon shaped tissue marker was deployed into the biopsy cavity. After biopsy at the 8 o'clock axis, 2 cm from the nipple, a coil shaped tissue marker was deployed into the biopsy cavity. Both clips appear appropriately positioned on this postprocedure mammogram. IMPRESSION: Postprocedure mammogram for clip placements. The 2 biopsy clips appear appropriately positioned at their respective biopsy sites. Final Assessment: Post Procedure Mammograms for Marker Placement Electronically Signed By: Franki Cabot M.D. On:  01/13/2016 14:47  ULTRASOUND BILATERAL BREAST  COMPARISON: Previous exam(s).  ACR Breast Density Category b: There are scattered areas of fibroglandular density.  FINDINGS: There is increasing density with underlying small masses in the right central breast correlating with the patient's symptoms. The increased density on the right spans up to 7 cm. Possible distortion in the upper outer left breast resolves on additional imaging. No other suspicious findings.  Mammographic images were processed with CAD.  On physical exam, a palpable lump is identified on the right.  Targeted ultrasound is performed, showing multiple small masses in the region of the patient's symptoms. A representative mass is seen at 8 o'clock, 2 cm from the nipple measuring 16 x 8 x 15 mm. An adjacent mass is seen at 8 o'clock, 1 cm from the nipple measuring 9 x 8 x 9 mm. Taken together, the 2 masses span a dimension of 3.2 cm on anti radial images and 3.8 cm on radial imaging. No axillary adenopathy.  No suspicious findings in the upper outer left breast.  IMPRESSION:          Patient: Rhonda Berg, Rhonda Berg Collected: 01/13/2016 Client: [facility] Accession: [id number] Received: 01/13/2016 A. Malka So, MD DOB: [birth year] Age: 75 Gender: F Reported: 01/16/2016 [address] Patient Ph: [phone] MRN #: [id number] [place], [place] [address] Client Acc#: Chart #: [id number] Phone: [phone] Fax: [phone] CC: CC: Darreld Mclean, MD REPORT OF SURGICAL PATHOLOGY ADDITIONAL INFORMATION: 2. FLUORESCENCE IN-SITU HYBRIDIZATION Results: HER2 - NEGATIVE RATIO OF HER2/CEP17 SIGNALS 1.62 AVERAGE HER2 COPY NUMBER PER CELL 3.65 Reference Range: NEGATIVE HER2/CEP17  Ratio <2.0 and average HER2 copy number <4.0 EQUIVOCAL HER2/CEP17 Ratio <2.0 and average HER2 copy number >=4.0 and <6.0 POSITIVE HER2/CEP17 Ratio >=2.0 or <2.0 and average HER2 copy number >=6.0 Claudette Laws MD Pathologist, Electronic Signature ( Signed 01/19/2016) 2. PROGNOSTIC INDICATORS Results: IMMUNOHISTOCHEMICAL AND MORPHOMETRIC ANALYSIS PERFORMED MANUALLY Estrogen Receptor: 100%, POSITIVE, STRONG STAINING INTENSITY Progesterone Receptor: 100%, POSITIVE, STRONG STAINING INTENSITY Proliferation Marker Ki67: 12% REFERENCE RANGE ESTROGEN RECEPTOR NEGATIVE 0% POSITIVE =>1% REFERENCE RANGE PROGESTERONE RECEPTOR NEGATIVE 0% POSITIVE =>1% 1 of 3 FINAL for Rhonda Berg, Rhonda Berg [phone]) ADDITIONAL INFORMATION:(continued) All controls stained appropriately Enid Cutter MD Pathologist, Electronic Signature ( Signed 01/17/2016) FINAL DIAGNOSIS Diagnosis 1. 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 2. 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 Microscopic Comment 1. and 2. While grading is best performed one the resection specimen, the invasive and in situ carcinoma appear grade 2. Prognostic markers will be ordered. Dr. Saralyn Pilar has reviewed the case. The case was called to [facility] on 01/16/2016. Vicente Males MD Pathologist, Electronic Signature (Case signed 01/16/2016) Specimen Gross and Clinical Information Specimen Comment 1. In formalin: 2:15, extracted less than 1 min; right breast masses Specimen(s) Obtained: 1. Breast, right, needle core biopsy, 8 o'clock, 1cm from nipple 2. Breast, right, needle core biopsy, 8 o'clock, 2cm from nipple Specimen Clinical Information 1. 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 Received in formalin(TIF 2:15 pm, CIT less than 1 minute), labeled with patients name and site, right breat 8:00 10FN , are 3 cores of tan yellow tissue measuring 1.5 to 2.0 cm. One block submitted. 2. Received in  formalin(TIF 2:15pm, CIT less than 1 minute), labeled with patients name and site, right breast 8:00 2 cm FN , are 3 cores of tan yellow tissue measuring 1.5 cm to 2.1 cm. One block submitted. Stain(s) used in Diagnosis: The following stain(s) were used in diagnosing the case: Her2 FISH, ER-ACIS, PR-ACIS, KI-67-ACIS. The control(s) stained appropriately. 2 of 3 FINAL for Rhonda Berg, Rhonda Berg [phone]) Disclaimer PR progesterone receptor (16), immunohistochemical stains are performed on formalin fixed, paraffin embedded tissue using a 3,3"-diaminobenzidine (DAB) chromogen and Leica Bond Autostainer System. The staining intensity of the nucleus is scored manually and is reported as the percentage of tumor cell nuclei demonstrating specific nuclear staining. Estrogen receptor (6F11), immunohistochemical stains are performed on formalin fixed, paraffin embedded tissue using a 3,3"-diaminobenzidine (DAB) chromogen and Leica Bond Autostainer System. The staining intensity of the nucleus is scored manually and is reported as the percentage of tumor cell nuclei demonstrating specific nuclear staining. HER2 IQFISH pharmDX (code [phone]) is a direct fluorescence in-situ hybridization assay designed to quantitatively determine HER2 gene amplification in formalin-fixed, paraffin-embedded tissue specimens. It is performed at [facility] and is reported using ASCO/CAP scoring criteria published in 2013. Ki-67 (MM1), immunohistochemical stains are performed on formalin fixed, paraffin embedded tissue using a 3,3"-diaminobenzidine (DAB) chromogen and Leica Bond Autostainer System. The staining intensity of the nucleus is scored manually and is reported as the percentage of tumor cell nuclei demonstrating specific nuclear staining. Report signed out from the following location(s) Technical Component was performed at [facility],[place] [id number].XNAT:[id number],[id number]., Technical Component was performed at [facility] [place], [place], [place] [address]. CLIA #: [id number], Interpretation was  performed at [facility] [address], [place], [place] [address]. CLIA #: [id number], 3 of 3.  The patient is a 75 year old female.   Other Problems Elbert Ewings, CMA; 01/23/2016 9:19 AM) Anxiety Disorder Arthritis Back Pain Breast Cancer Depression Diabetes Mellitus Hypercholesterolemia Lump In Breast Sleep Apnea  Past Surgical History Elbert Ewings, CMA; 01/23/2016 9:19 AM) Breast Biopsy Right. Cataract Surgery Bilateral. Colon Polyp Removal - Colonoscopy Hemorrhoidectomy Hysterectomy (not due to cancer) - Partial Knee Surgery Bilateral. Tonsillectomy  Diagnostic Studies History Elbert Ewings, CMA; 01/23/2016 9:19 AM) Colonoscopy 1-5 years ago Mammogram within last year  Allergies Elbert Ewings, CMA; 01/23/2016 9:19 AM) No Known Drug Allergies 01/23/2016  Medication History Elbert Ewings, CMA; 01/23/2016 9:21 AM) ClonazePAM (0.5MG Tablet, Oral) Active. Amantadine HCl (100MG Tablet, Oral) Active. Ventolin HFA (108 (90 Base)MCG/ACT Aerosol Soln, Inhalation) Active. Aspirin (81MG Tablet, Oral) Active. Furosemide (40MG Tablet, Oral) Active. Gabapentin (600MG Tablet, Oral) Active. Basaglar KwikPen (100UNIT/ML Soln Pen-inj, Subcutaneous) Active. MetFORMIN HCl (500MG Tablet, Oral) Active. Mirtazapine (15MG Tablet, Oral) Active. Propranolol HCl (10MG Tablet, Oral) Active. QUEtiapine Fumarate (200MG Tablet, Oral) Active. Simvastatin (40MG Tablet, Oral) Active. Albuterol Sulfate HFA (108 (90 Base)MCG/ACT Aerosol Soln, Inhalation) Active. Medications Reconciled  Social History Elbert Ewings, [place]; 01/23/2016 9:19 AM) Alcohol use Remotely quit alcohol use. Caffeine use Tea. Illicit drug use Remotely quit drug  use. Tobacco use Former smoker.  Family History Elbert Ewings, [place]; 01/23/2016 9:19 AM) Alcohol Abuse Brother, Sister. Breast Cancer Family Members In General, Mother, Sister. Diabetes Mellitus Family Members In General. Family history unknown First Degree Relatives  Pregnancy / Birth History Elbert Ewings, CMA; 01/23/2016 9:19 AM) Age at menarche 37 years. Age of menopause <47 Contraceptive History Oral contraceptives. Gravida 2 Maternal age 18-20 Para 1     Review of Systems Elbert Ewings CMA; 01/23/2016 9:19 AM) General Present- Appetite Loss and Fatigue. Not Present- Chills, Fever, Night Sweats, Weight Gain and Weight Loss. Skin Not Present- Change in Wart/Mole, Dryness, Hives, Jaundice, New Lesions, Non-Healing Wounds, Rash and Ulcer. HEENT Present- Wears glasses/contact lenses. Not Present- Earache, Hearing Loss, Hoarseness, Nose Bleed, Oral Ulcers, Ringing in the Ears, Seasonal Allergies, Sinus Pain, Sore Throat, Visual Disturbances and Yellow Eyes. Respiratory Present- Snoring and Wheezing. Not Present- Bloody sputum, Chronic Cough and Difficulty Breathing. Breast Present- Breast Mass. Not Present- Breast Pain, Nipple Discharge and Skin Changes. Cardiovascular Not Present- Chest Pain, Difficulty Breathing Lying Down, Leg Cramps, Palpitations, Rapid Heart Rate, Shortness of Breath and Swelling of Extremities. Gastrointestinal Not Present- Abdominal Pain, Bloating, Bloody Stool, Change in Bowel Habits, Chronic diarrhea, Constipation, Difficulty Swallowing, Excessive gas, Gets full quickly at meals, Hemorrhoids, Indigestion, Nausea, Rectal Pain and Vomiting. Female Genitourinary Not Present- Frequency, Nocturia, Painful Urination, Pelvic Pain and Urgency. Neurological Present- Tingling and Weakness. Not Present- Decreased Memory, Fainting, Headaches, Numbness, Seizures, Tremor and Trouble walking. Psychiatric Present- Anxiety, Bipolar and Depression. Not Present- Change in  Sleep Pattern, Fearful and Frequent crying. Endocrine Present- Hot flashes. Not Present- Cold Intolerance, Excessive Hunger, Hair Changes, Heat Intolerance and New Diabetes. Hematology Present- Blood Thinners. Not Present- Easy Bruising, Excessive bleeding, Gland problems, HIV and Persistent Infections.  Vitals Elbert Ewings CMA; 01/23/2016 9:21 AM) 01/23/2016 9:21 AM Weight: 263 lb Height: 67in Body Surface Area: 2.27 m Body Mass Index: 41.19 kg/m  Temp.: 98.67F(Temporal)  Pulse: 87 (Regular)  BP: 126/78 (Sitting, Left Arm, Standard)      Physical Exam (Kaylinn Dedic A. Gerturde Kuba MD; 01/23/2016 10:13 AM)  General Mental Status-Alert. General Appearance-Consistent with stated  age. Hydration-Well hydrated. Voice-Normal.  Head and Neck Head-normocephalic, atraumatic with no lesions or palpable masses. Trachea-midline. Thyroid Gland Characteristics - normal size and consistency.  Eye Eyeball - Bilateral-Extraocular movements intact. Sclera/Conjunctiva - Bilateral-No scleral icterus.  Chest and Lung Exam Chest and lung exam reveals -quiet, even and easy respiratory effort with no use of accessory muscles and on auscultation, normal breath sounds, no adventitious sounds and normal vocal resonance. Inspection Chest Wall - Normal. Back - normal.  Breast Note: Bruising right breast in o'clock region at the border of the nipple. Small hematoma. Right axilla normal. Left breast normal. Left axilla normal.  Cardiovascular Cardiovascular examination reveals -normal heart sounds, regular rate and rhythm with no murmurs and normal pedal pulses bilaterally.  Neurologic Neurologic evaluation reveals -alert and oriented x 3 with no impairment of recent or remote memory. Mental Status-Normal.  Lymphatic Head & Neck  General Head & Neck Lymphatics: Bilateral - Description - Normal. Axillary  General Axillary Region: Bilateral - Description - Normal.  Tenderness - Non Tender.    Assessment & Plan (Anyela Napierkowski A. Deshana Rominger MD; 01/23/2016 10:13 AM)  BREAST CANCER, RIGHT (C50.911) Impression: Discuss breast conservation versus mastectomy. Discussed reconstruction. Risks, benefits and long-term expectations of both were discussed. Patient has opted for right breast lumpectomy. The 2 clips are within a centimeter of each other looks like after reviewing the mammogram and a single seed could be used. We discussed that all lymph node mapping. She was to proceed with lumpectomy with sentinel lymph node mapping on the right. Risk of lumpectomy include bleeding, infection, seroma, more surgery, use of seed/wire, wound care, cosmetic deformity and the need for other treatments, death , blood clots, death. Pt agrees to proceed. Risk of sentinel lymph node mapping include bleeding, infection, lymphedema, shoulder pain. stiffness, dye allergy. cosmetic deformity , blood clots, death, need for more surgery. Pt agres to proceed.  MALIGNANT NEOPLASM OF LOWER-OUTER QUADRANT OF RIGHT BREAST OF FEMALE, ESTROGEN RECEPTOR POSITIVE (C50.511)  Current Plans Pt Education - [facility] Breast Cancer Information Given - [facility] "Breast Journey" Package Pt Education - Pamphlet Given - Breast Biopsy: discussed with patient and provided information. We discussed the staging and pathophysiology of breast cancer. We discussed all of the different options for treatment for breast cancer including surgery, chemotherapy, radiation therapy, Herceptin, and antiestrogen therapy. We discussed a sentinel lymph node biopsy as she does not appear to having lymph node involvement right now. We discussed the performance of that with injection of radioactive tracer and blue dye. We discussed that she would have an incision underneath her axillary hairline. We discussed that there is a bout a 10-20% chance of having a positive node with a sentinel lymph node biopsy and we will await the permanent pathology to  make any other first further decisions in terms of her treatment. One of these options might be to return to the operating room to perform an axillary lymph node dissection. We discussed about a 1-2% risk lifetime of chronic shoulder pain as well as lymphedema associated with a sentinel lymph node biopsy. We discussed the options for treatment of the breast cancer which included lumpectomy versus a mastectomy. We discussed the performance of the lumpectomy with a wire placement. We discussed a 10-20% chance of a positive margin requiring reexcision in the operating room. We also discussed that she may need radiation therapy or antiestrogen therapy or both if she undergoes lumpectomy. We discussed the mastectomy and the postoperative care for that as well. We discussed that  there is no difference in her survival whether she undergoes lumpectomy with radiation therapy or antiestrogen therapy versus a mastectomy. There is a slight difference in the local recurrence rate being 3-5% with lumpectomy and about 1% with a mastectomy. We discussed the risks of operation including bleeding, infection, possible reoperation. She understands her further therapy will be based on what her stages at the time of her operation.  Pt Education - [facility] breast cancer surgery: discussed with patient and provided information. Pt Education - ABC (After Breast Cancer) Class Info: discussed with patient and provided information.

## 2016-01-26 NOTE — Telephone Encounter (Signed)
Appt scheduled w/Feng on 10/24 @2 :30pm. Wanted to schedule on the same day as Radonc appt. Ok with coming to cancer twice in the same day.

## 2016-01-30 DIAGNOSIS — C50511 Malignant neoplasm of lower-outer quadrant of right female breast: Secondary | ICD-10-CM | POA: Insufficient documentation

## 2016-01-30 NOTE — Progress Notes (Signed)
[facility]  [phone] [phone]  Clinic New Consult Note   Patient Care Team: Darreld Mclean, MD as PCP - General (Family Medicine) 01/31/2016  REFERRAL PHYSICIAN: Dr. Brantley Stage   CHIEF COMPLAINTS/PURPOSE OF CONSULTATION:  Newly diagnosed right breast cancer  Oncology History   Breast cancer of lower-outer quadrant of right female breast [facility])   Staging form: Breast, AJCC 7th Edition   - Clinical stage from 01/13/2016: Stage IA (T1c(m), N0, M0) - Signed by Truitt Merle, MD on 01/30/2016      Breast cancer of lower-outer quadrant of right female breast ([place])   01/11/2016 Mammogram    Diagnostic mammogram and ultrasound showed multiple small masses in the lower outer quadrant of the right breast, 2 representative mass at 8:00 position measuring 1.6 and the 0.9 cm. The axillary adenopathy.       01/13/2016 Initial Diagnosis    Breast cancer of lower-outer quadrant of right female breast ([place])      01/13/2016 Initial Biopsy    Right breast 8:00 position two biopsies showed invasive ductal carcinoma, and intermediate grade DCIS.      01/13/2016 Receptors her2    ER 100% positive, PR 100% positive, HER-2 negative, Ki-67 12%       HISTORY OF PRESENTING ILLNESS:  Rhonda Berg 75 y.o. female with past medical history of diabetes, arthritis, bipolar, is here because of her recently diagnosed right breast cancer. She presents to my clinic by herself today.  She has been having routine annual mammogram which has been normal. she felt a right breast lump about one month ago, no significant tenderness, no skin change or nipple discharge. she was seen by her PCP and was sent for diagnostic mammogram and ultrasound, which showed multiple masses in the lower outer quadrant of right breast, the largest one 1.6cm. no axillary adenopathy. 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 She was referred to breast surgeon Dr. Brantley Stage, and was offered lumpectomy and sentinel lymph node biopsy.   She has chronic back pain, uses a crane to get around. She is married, lives with her husband and daughter, her husband has health issue also, so they move to their daughter's house in 06/2015. She is able to do housework, cooking, laundry, etc. she does not exercise regularly, but remains to be moderately physically active.  She has history of bipolar, depression and anxiety, on 3 medications, and her mood disorder seems to be controlled. She also has type 2 diabetes, used to be on insulin, now on metformin alone. She has strong family history of breast cancer.   GYN HISTORY  Menarchal: 10 LMP: 30's (she had hysterectomy for fibroids) Contraceptive: none  HRT: none  G2P1: one miscarriage, one daughter who is 69 yo     MEDICAL HISTORY:  Past Medical History:  Diagnosis Date  . Bipolar 1 disorder ([place])   . Depression   . Diabetes mellitus without complication ([place])   . Diabetic neuropathy ([place])   . Hypercholesteremia   . Hypertension   . Peripheral edema   . Proteinuria     SURGICAL HISTORY: Past Surgical History:  Procedure Laterality Date  . ABDOMINAL HYSTERECTOMY    . COLONOSCOPY    . KNEE SURGERY      SOCIAL HISTORY: Social History   Social History  . Marital status: Married  Spouse name: N/A  . Number of children: N/A  . Years of education: N/A   Occupational History  . Not on file.   Social History Main Topics  . Smoking status: Former Smoker    Packs/day: 0.25    Years: 40.00    Quit date: 04/09/2002  . Smokeless tobacco: Never Used  . Alcohol use No     Comment: social drinker   . Drug use:     Types: Cocaine     Comment: when she was in [age]   . Sexual activity: Not on file   Other Topics Concern  . Not on file   Social History Narrative  . No narrative  on file    FAMILY HISTORY: Family History  Problem Relation Age of Onset  . Cancer Mother     breast cancer   . Cancer Sister     breast cancer   . Cancer Maternal Aunt 63    breast cancer   . Cancer Cousin     colon cancer     ALLERGIES:  has No Known Allergies.  MEDICATIONS:  Current Outpatient Prescriptions  Medication Sig Dispense Refill  . albuterol (PROVENTIL HFA;VENTOLIN HFA) 108 (90 Base) MCG/ACT inhaler Inhale 2 puffs into the lungs every 6 (six) hours as needed for wheezing or shortness of breath. 1 Inhaler 6  . amantadine (SYMMETREL) 100 MG capsule Take 100 mg by mouth 2 (two) times daily.    [name] aspirin 81 MG tablet Take 81 mg by mouth daily.    . Blood Glucose Monitoring Suppl MISC Pt uses one touch verio flex. Please dispense test strips #100 and lancets #100. She tests her sugar 2-3x daily 100 each 3  . clonazePAM (KLONOPIN) 1 MG tablet Take 1 mg by mouth at bedtime.    . furosemide (LASIX) 40 MG tablet Take 1 tablet (40 mg total) by mouth daily. 30 tablet 6  . gabapentin (NEURONTIN) 600 MG tablet Take 600 mg by mouth 4 (four) times daily.     . metFORMIN (GLUCOPHAGE) 500 MG tablet Take 1 tablet (500 mg total) by mouth 2 (two) times daily with a meal. 60 tablet 6  . mirtazapine (REMERON) 15 MG tablet Take 15 mg by mouth at bedtime.    . propranolol (INDERAL) 10 MG tablet Take 10 mg by mouth 2 (two) times daily.     . QUEtiapine (SEROQUEL) 200 MG tablet Take 1 tablet by mouth at bedtime.    . simvastatin (ZOCOR) 40 MG tablet Take 1 tablet (40 mg total) by mouth every evening. 30 tablet 6  . VENTOLIN HFA 108 (90 Base) MCG/ACT inhaler Inhale 2 puffs into the lungs every 6 (six) hours.     No current facility-administered medications for this visit.     REVIEW OF SYSTEMS:   Constitutional: Denies fevers, chills or abnormal night sweats Eyes: Denies blurriness of vision, double vision or watery eyes Ears, nose, mouth, throat, and face: Denies mucositis or sore  throat Respiratory: Denies cough, dyspnea or wheezes Cardiovascular: Denies palpitation, chest discomfort or lower extremity swelling Gastrointestinal:  Denies nausea, heartburn or change in bowel habits Skin: Denies abnormal skin rashes Lymphatics: Denies new lymphadenopathy or easy bruising Neurological:Denies numbness, tingling or new weaknesses Behavioral/Psych: Mood is stable, no new changes  All other systems were reviewed with the patient and are negative.  PHYSICAL EXAMINATION: ECOG PERFORMANCE STATUS: 1 - Symptomatic but completely ambulatory  Vitals:   01/31/16 1443  BP: 129/80  Pulse: 81  Resp:  19  Temp: 98.9 F (37.2 C)   Filed Weights   01/31/16 1443  Weight: 261 lb 11.2 oz (118.7 kg)    GENERAL:alert, no distress and comfortable SKIN: skin color, texture, turgor are normal, no rashes or significant lesions EYES: normal, conjunctiva are pink and non-injected, sclera clear OROPHARYNX:no exudate, no erythema and lips, buccal mucosa, and tongue normal  NECK: supple, thyroid normal size, non-tender, without nodularity LYMPH:  no palpable lymphadenopathy in the cervical, axillary or inguinal LUNGS: clear to auscultation and percussion with normal breathing effort HEART: regular rate & rhythm and no murmurs and no lower extremity edema ABDOMEN:abdomen soft, non-tender and normal bowel sounds Musculoskeletal:no cyanosis of digits and no clubbing  PSYCH: alert & oriented x 3 with fluent speech NEURO: no focal motor/sensory deficits Breasts: Breast inspection showed them to be symmetrical with no nipple discharge. (+) Skin bruise inter-lower outer quadrant of right breast from biopsy, there are two palpable mass in that area which are close to nipple, measuring 1.5X1 cm and 1cm. Palpation of the left breast and axilla revealed no obvious mass that I could appreciate.   LABORATORY DATA:  I have reviewed the data as listed CBC Latest Ref Rng & Units 04/13/2012  WBC 4.0 -  10.5 K/uL 6.4  Hemoglobin 12.0 - 15.0 g/dL 14.2  Hematocrit 36.0 - 46.0 % 42.5  Platelets 150 - 400 K/uL 207   CMP Latest Ref Rng & Units 11/24/2015 04/13/2012  Glucose 70 - 99 mg/dL 351(H) 235(H)  BUN 6 - 23 mg/dL 8 12  Creatinine 0.40 - 1.20 mg/dL 0.98 0.70  Sodium 135 - 145 mEq/L 136 138  Potassium 3.5 - 5.1 mEq/L 4.2 4.2  Chloride 96 - 112 mEq/L 97 98  CO2 19 - 32 mEq/L 31 26  Calcium 8.4 - 10.5 mg/dL 9.5 9.8  Total Protein 6.0 - 8.3 g/dL 7.1 -  Total Bilirubin 0.2 - 1.2 mg/dL 0.3 -  Alkaline Phos 39 - 117 U/L 92 -  AST 0 - 37 U/L 25 -  ALT 0 - 35 U/L 22 -   PATHOLOGY REPORT AL DIAGNOSIS Diagnosis 01/13/2016 1. 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 2. 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 Microscopic Comment 1. and 2. While grading is best performed one the resection specimen, the invasive and in situ carcinoma appear grade 2. Prognostic markers will be ordered. Dr. Saralyn Pilar has reviewed the case. The case was called to [facility] on 01/16/2016. Results: IMMUNOHISTOCHEMICAL AND MORPHOMETRIC ANALYSIS PERFORMED MANUALLY Estrogen Receptor: 100%, POSITIVE, STRONG STAINING INTENSITY Progesterone Receptor: 100%, POSITIVE, STRONG STAINING INTENSITY Proliferation Marker Ki67: 12% Results: HER2 - NEGATIVE RATIO OF HER2/CEP17 SIGNALS 1.62 AVERAGE HER2 COPY NUMBER PER CELL 3.65    RADIOGRAPHIC STUDIES: I have personally reviewed the radiological images as listed and agreed with the findings in the report. Mm Digital Diagnostic Unilat R  Result Date: 01/13/2016 CLINICAL DATA:  Patient with multiple right breast masses, status post 2 ultrasound-guided biopsies today. EXAM: DIAGNOSTIC RIGHT MAMMOGRAM POST ULTRASOUND BIOPSY (2 biopsies)  COMPARISON:  Previous exam(s). FINDINGS: Mammographic images were obtained following ultrasound guided biopsy of suspicious masses within the right breast at the 8 o'clock axis, 1 cm from the nipple, and at the 8 o'clock axis, 2 cm from the nipple. After biopsy at the  8 o'clock axis, 1 cm from nipple, a ribbon shaped tissue marker was deployed into the biopsy cavity. After biopsy at the 8 o'clock axis, 2 cm from the nipple, a coil shaped tissue marker was deployed into the biopsy cavity. Both clips appear appropriately positioned on this postprocedure mammogram. IMPRESSION: Postprocedure mammogram for clip placements. The 2 biopsy clips appear appropriately positioned at their respective biopsy sites. Final Assessment: Post Procedure Mammograms for Marker Placement Electronically Signed   By: Franki Cabot M.D.   On: 01/13/2016 14:47   US Breast Ltd Uni Left Inc Axilla  Result Date: 01/11/2016 CLINICAL DATA:  Palpable lump in the right breast EXAM: 2D DIGITAL DIAGNOSTIC BILATERAL MAMMOGRAM WITH CAD AND ADJUNCT TOMO ULTRASOUND BILATERAL BREAST COMPARISON:  Previous exam(s). ACR Breast Density Category b: There are scattered areas of fibroglandular density. FINDINGS: There is increasing density with underlying small masses in the right central breast correlating with the patient's symptoms. The increased density on the right spans up to 7 cm. Possible distortion in the upper outer left breast resolves on additional imaging. No other suspicious findings. Mammographic images were processed with CAD. On physical exam, a palpable lump is identified on the right. Targeted ultrasound is performed, showing multiple small masses in the region of the patient's symptoms. A representative mass is seen at 8 o'clock, 2 cm from the nipple measuring 16 x 8 x 15 mm. An adjacent mass is seen at 8 o'clock, 1 cm from the nipple measuring 9 x 8 x 9 mm. Taken together, the 2 masses span a dimension of 3.2 cm on anti radial images and  3.8 cm on radial imaging. No axillary adenopathy. No suspicious findings in the upper outer left breast. IMPRESSION: Highly suspicious masses with associated distortion centered at 8 o'clock in the right breast. Sonographically, the maximum extent of disease is 3.8 cm. Mammographically, I suspect the extent of disease may be on the order of 7 cm. No evidence of malignancy on the left. RECOMMENDATION: Recommend biopsying the mass at 8 o'clock, 2 cm from the nipple in the right breast and the mass at 8 o'clock, 1 cm from the nipple in the right breast. Assuming a diagnosis of malignancy, breast MRI would be well suited to evaluate extent of disease. However, the patient is unsure whether she can complete a breast MRI due to severe claustrophobia. I have discussed the findings and recommendations with the patient. Results were also provided in writing at the conclusion of the visit. If applicable, a reminder letter will be sent to the patient regarding the next appointment. BI-RADS CATEGORY  4: Suspicious. Electronically Signed   By: Dorise Bullion III M.D   On: 01/11/2016 11:32   US Breast Ltd Uni Right Inc Axilla  Result Date: 01/11/2016 CLINICAL DATA:  Palpable lump in the right breast EXAM: 2D DIGITAL DIAGNOSTIC BILATERAL MAMMOGRAM WITH CAD AND ADJUNCT TOMO ULTRASOUND BILATERAL BREAST COMPARISON:  Previous exam(s). ACR Breast Density Category b: There are scattered areas of fibroglandular density. FINDINGS: There is increasing density with underlying small masses in the right central breast correlating with the patient's symptoms. The increased density on the right spans up to 7 cm. Possible distortion in the upper outer left breast resolves on additional imaging. No other suspicious findings. Mammographic images were processed with CAD. On physical exam, a palpable lump is identified on the right. Targeted ultrasound is performed, showing multiple small masses in the region of the patient's symptoms. A  representative mass is seen at 8 o'clock,  2 cm from the nipple measuring 16 x 8 x 15 mm. An adjacent mass is seen at 8 o'clock, 1 cm from the nipple measuring 9 x 8 x 9 mm. Taken together, the 2 masses span a dimension of 3.2 cm on anti radial images and 3.8 cm on radial imaging. No axillary adenopathy. No suspicious findings in the upper outer left breast. IMPRESSION: Highly suspicious masses with associated distortion centered at 8 o'clock in the right breast. Sonographically, the maximum extent of disease is 3.8 cm. Mammographically, I suspect the extent of disease may be on the order of 7 cm. No evidence of malignancy on the left. RECOMMENDATION: Recommend biopsying the mass at 8 o'clock, 2 cm from the nipple in the right breast and the mass at 8 o'clock, 1 cm from the nipple in the right breast. Assuming a diagnosis of malignancy, breast MRI would be well suited to evaluate extent of disease. However, the patient is unsure whether she can complete a breast MRI due to severe claustrophobia. I have discussed the findings and recommendations with the patient. Results were also provided in writing at the conclusion of the visit. If applicable, a reminder letter will be sent to the patient regarding the next appointment. BI-RADS CATEGORY  4: Suspicious. Electronically Signed   By: Dorise Bullion III M.D   On: 01/11/2016 11:32   Mm Diag Breast Tomo Bilateral  Result Date: 01/11/2016 CLINICAL DATA:  Palpable lump in the right breast EXAM: 2D DIGITAL DIAGNOSTIC BILATERAL MAMMOGRAM WITH CAD AND ADJUNCT TOMO ULTRASOUND BILATERAL BREAST COMPARISON:  Previous exam(s). ACR Breast Density Category b: There are scattered areas of fibroglandular density. FINDINGS: There is increasing density with underlying small masses in the right central breast correlating with the patient's symptoms. The increased density on the right spans up to 7 cm. Possible distortion in the upper outer left breast resolves on additional imaging.  No other suspicious findings. Mammographic images were processed with CAD. On physical exam, a palpable lump is identified on the right. Targeted ultrasound is performed, showing multiple small masses in the region of the patient's symptoms. A representative mass is seen at 8 o'clock, 2 cm from the nipple measuring 16 x 8 x 15 mm. An adjacent mass is seen at 8 o'clock, 1 cm from the nipple measuring 9 x 8 x 9 mm. Taken together, the 2 masses span a dimension of 3.2 cm on anti radial images and 3.8 cm on radial imaging. No axillary adenopathy. No suspicious findings in the upper outer left breast. IMPRESSION: Highly suspicious masses with associated distortion centered at 8 o'clock in the right breast. Sonographically, the maximum extent of disease is 3.8 cm. Mammographically, I suspect the extent of disease may be on the order of 7 cm. No evidence of malignancy on the left. RECOMMENDATION: Recommend biopsying the mass at 8 o'clock, 2 cm from the nipple in the right breast and the mass at 8 o'clock, 1 cm from the nipple in the right breast. Assuming a diagnosis of malignancy, breast MRI would be well suited to evaluate extent of disease. However, the patient is unsure whether she can complete a breast MRI due to severe claustrophobia. I have discussed the findings and recommendations with the patient. Results were also provided in writing at the conclusion of the visit. If applicable, a reminder letter will be sent to the patient regarding the next appointment. BI-RADS CATEGORY  4: Suspicious. Electronically Signed   By: Dorise Bullion III M.D   On: 01/11/2016  11:92   [place] Rt Breast Bx W Loc Dev 1st Lesion Img Bx Spec US Guide  Addendum Date: 01/16/2016   ADDENDUM REPORT: 01/16/2016 14:09 ADDENDUM: Pathology revealed GRADE II INVASIVE DUCTAL CARCINOMA, INTERMEDIATE GRADE DUCTAL CARCINOMA IN SITU of the Right breast at both 8:00 o'clock locations, 1 CMFN and 2 CMFN. This was found to be concordant by Dr. Franki Cabot. Pathology results were discussed with the patient by telephone. The patient reported doing well after the biopsies with tenderness at the sites. Post biopsy instructions and care were reviewed and questions were answered. The patient was encouraged to call [facility] for any additional concerns. Surgical consultation has been arranged with Dr. Erroll Luna at [facility] on January 23, 2016. Recommend MRI to evaluate extent of disease, however, the patient is unsure whether she can complete a breast MRI due to severe claustrophobia. Pathology results reported by Terie Purser, RN on 01/16/2016. Electronically Signed   By: Franki Cabot M.D.   On: 01/16/2016 14:09   Result Date: 01/16/2016 CLINICAL DATA:  Patient with multiple masses within the lower outer quadrant of the right breast. Two sites recommended for biopsy. EXAM: ULTRASOUND GUIDED RIGHT BREAST CORE NEEDLE BIOPSY x2 COMPARISON:  Previous exam(s). PROCEDURE: I met with the patient and we discussed the procedure of ultrasound-guided biopsy, including benefits and alternatives. We discussed the high likelihood of a successful procedure. We discussed the risks of the procedure including infection, bleeding, tissue injury, clip migration, and inadequate sampling. Informed written consent was given. The usual time-out protocol was performed immediately prior to the procedure. Using sterile technique and 1% Lidocaine as local anesthetic, under direct ultrasound visualization, a 12 gauge spring-loaded device was used to perform biopsy of the right breast mass at the 8 o'clock axis, 1 cm from the nipple,using an inferolateral approach. At the conclusion of the procedure, a ribbon shaped tissue marker clip was deployed into the biopsy cavity. Next, using sterile technique and 1% lidocaine as local anesthetic, under direct ultrasound visualization, a 12 gauge spring-loaded device was used to perform biopsy of the  right breast mass at the 8 o'clock axis, 2 cm from the nipple, using a lateral approach. At the conclusion of the procedure, a coil shaped tissue marker clip was deployed in the biopsy cavity. Follow-up 2-view mammogram was performed and dictated separately. IMPRESSION: Ultrasound-guided biopsy of 2 masses within the right breast at the 8 o'clock axis, 1 cm from the nipple and 2 cm from the nipple respectively. No apparent complications. Electronically Signed: By: Franki Cabot M.D. On: 01/13/2016 14:28   [place] Rt Breast Bx W Loc Dev Ea Add Lesion Img Bx Spec US Guide  Addendum Date: 01/16/2016   ADDENDUM REPORT: 01/16/2016 14:09 ADDENDUM: Pathology revealed GRADE II INVASIVE DUCTAL CARCINOMA, INTERMEDIATE GRADE DUCTAL CARCINOMA IN SITU of the Right breast at both 8:00 o'clock locations, 1 CMFN and 2 CMFN. This was found to be concordant by Dr. Franki Cabot. Pathology results were discussed with the patient by telephone. The patient reported doing well after the biopsies with tenderness at the sites. Post biopsy instructions and care were reviewed and questions were answered. The patient was encouraged to call [facility] for any additional concerns. Surgical consultation has been arranged with Dr. Erroll Luna at [facility] on January 23, 2016. Recommend MRI to evaluate extent of disease, however, the patient is unsure whether she can complete a breast MRI due to severe claustrophobia.  Pathology results reported by Terie Purser, RN on 01/16/2016. Electronically Signed   By: Franki Cabot M.D.   On: 01/16/2016 14:09   Result Date: 01/16/2016 CLINICAL DATA:  Patient with multiple masses within the lower outer quadrant of the right breast. Two sites recommended for biopsy. EXAM: ULTRASOUND GUIDED RIGHT BREAST CORE NEEDLE BIOPSY x2 COMPARISON:  Previous exam(s). PROCEDURE: I met with the patient and we discussed the procedure of ultrasound-guided biopsy, including benefits  and alternatives. We discussed the high likelihood of a successful procedure. We discussed the risks of the procedure including infection, bleeding, tissue injury, clip migration, and inadequate sampling. Informed written consent was given. The usual time-out protocol was performed immediately prior to the procedure. Using sterile technique and 1% Lidocaine as local anesthetic, under direct ultrasound visualization, a 12 gauge spring-loaded device was used to perform biopsy of the right breast mass at the 8 o'clock axis, 1 cm from the nipple,using an inferolateral approach. At the conclusion of the procedure, a ribbon shaped tissue marker clip was deployed into the biopsy cavity. Next, using sterile technique and 1% lidocaine as local anesthetic, under direct ultrasound visualization, a 12 gauge spring-loaded device was used to perform biopsy of the right breast mass at the 8 o'clock axis, 2 cm from the nipple, using a lateral approach. At the conclusion of the procedure, a coil shaped tissue marker clip was deployed in the biopsy cavity. Follow-up 2-view mammogram was performed and dictated separately. IMPRESSION: Ultrasound-guided biopsy of 2 masses within the right breast at the 8 o'clock axis, 1 cm from the nipple and 2 cm from the nipple respectively. No apparent complications. Electronically Signed: By: Franki Cabot M.D. On: 01/13/2016 14:28    ASSESSMENT & PLAN:  75 year old post menopause Caucasian female, with past medical history of diabetes, arthritis, bipolar, presented with a palpable right breast masses  1. 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 -She was seen by breast surgeon Dr. Brantley Stage, who offered her lumpectomy and sentinel lymph node biopsy.   -Given her early stage breast cancer, negative lymph  nodes, low Ki-67, strong ER/PR positive and HER-2 negative disease, I think the risk of cancer recurrence after her computed surgical resection is not very high. -I recommend a Oncotype Dx test on the surgical sample and we'll make a decision about adjuvant chemotherapy based on the Oncotype result. Written material of this test was given to her. She does have multiple medical comorbidities, but has decent functional status, if she does have high-risk disease, would be a candidate for chemotherapy. -If her surgical sentinel lymph node node positive, I recommend mammaprint for further risk stratification and guide adjuvant chemotherapy. -Giving the strong ER and PR expression in her postmenopausal status, I recommend adjuvant endocrine therapy with aromatase inhibitor for a total of 5-10 years to reduce the risk of cancer recurrence. Potential benefits and side effects were discussed with patient and she is interested. -She will see radiation oncologist Dr. Isidore Moos tomorrow. -We also discussed the breast cancer surveillance after her surgery. She will continue annual screening mammogram, self exam, and a routine office visit with lab and exam with [place]. -I encouraged her to have healthy diet and exercise regularly.   2. DM -Follow up with primary care physician and continue medication  3. Bipolar disorder -Seem to be well controlled, continue medication  4.  Arthritis  Plan -she will have her right breast lumpectomy and sentinel lymph node biopsy serum -I recommend Oncotype if node negative and tumor>1.0cm, or mammaprint if node positive, on her surgical sample -I plan to see her back 3 weeks after her surgery to review the test results.  All questions were answered. The patient knows to call the clinic with any problems, questions or concerns. I spent 55 minutes counseling the patient face to face. The total time spent in the appointment was 60 minutes and more than 50% was on counseling.      Truitt Merle, MD 01/31/2016 5:29 PM

## 2016-01-31 ENCOUNTER — Ambulatory Visit (HOSPITAL_BASED_OUTPATIENT_CLINIC_OR_DEPARTMENT_OTHER): Payer: Medicare Other | Admitting: Hematology

## 2016-01-31 ENCOUNTER — Encounter: Payer: Self-pay | Admitting: *Deleted

## 2016-01-31 ENCOUNTER — Ambulatory Visit: Payer: Medicare Other

## 2016-01-31 ENCOUNTER — Encounter: Payer: Self-pay | Admitting: Hematology

## 2016-01-31 ENCOUNTER — Ambulatory Visit
Admission: RE | Admit: 2016-01-31 | Discharge: 2016-01-31 | Disposition: A | Discharge status: Home / Self Care | Payer: Medicare Other | Source: Ambulatory Visit | Attending: Radiation Oncology | Admitting: Radiation Oncology

## 2016-01-31 DIAGNOSIS — Z79899 Other long term (current) drug therapy: Secondary | ICD-10-CM | POA: Insufficient documentation

## 2016-01-31 DIAGNOSIS — E119 Type 2 diabetes mellitus without complications: Secondary | ICD-10-CM

## 2016-01-31 DIAGNOSIS — Z170 Estrogen receptor positive status [ER+]: Secondary | ICD-10-CM

## 2016-01-31 DIAGNOSIS — F319 Bipolar disorder, unspecified: Secondary | ICD-10-CM

## 2016-01-31 DIAGNOSIS — E1140 Type 2 diabetes mellitus with diabetic neuropathy, unspecified: Secondary | ICD-10-CM | POA: Insufficient documentation

## 2016-01-31 DIAGNOSIS — E7800 Pure hypercholesterolemia, unspecified: Secondary | ICD-10-CM | POA: Insufficient documentation

## 2016-01-31 DIAGNOSIS — C50511 Malignant neoplasm of lower-outer quadrant of right female breast: Secondary | ICD-10-CM | POA: Insufficient documentation

## 2016-01-31 DIAGNOSIS — I10 Essential (primary) hypertension: Secondary | ICD-10-CM | POA: Insufficient documentation

## 2016-01-31 DIAGNOSIS — M1990 Unspecified osteoarthritis, unspecified site: Secondary | ICD-10-CM | POA: Diagnosis not present

## 2016-01-31 DIAGNOSIS — Z9889 Other specified postprocedural states: Secondary | ICD-10-CM | POA: Insufficient documentation

## 2016-01-31 DIAGNOSIS — Z87891 Personal history of nicotine dependence: Secondary | ICD-10-CM | POA: Insufficient documentation

## 2016-01-31 DIAGNOSIS — Z7984 Long term (current) use of oral hypoglycemic drugs: Secondary | ICD-10-CM | POA: Insufficient documentation

## 2016-01-31 DIAGNOSIS — F40240 Claustrophobia: Secondary | ICD-10-CM | POA: Insufficient documentation

## 2016-01-31 DIAGNOSIS — Z7982 Long term (current) use of aspirin: Secondary | ICD-10-CM | POA: Insufficient documentation

## 2016-01-31 NOTE — Progress Notes (Signed)
Location of Breast Cancer: Right Breast  Histology per Pathology Report: 01/13/16  Diagnosis 1. 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 2. 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  2. Receptor Status: ER(100%), PR (100%), Her2-neu (NEG), Ki-(12%)  Did patient present with symptoms or was this found on screening mammography?: She self palpated the mass.   Past/Anticipated interventions by surgeon, if any: Dr. Brantley Stage saw 01/23/16. Her surgery has not been scheduled yet.   Past/Anticipated interventions by medical oncology, if any:  01/31/16 Dr. Burr Medico Plan -she will have her right breast lumpectomy and sentinel lymph node biopsy serum -I recommend Oncotype if node negative and tumor>1.0cm, or mammaprint if node positive, on her surgical sample -I plan to see her back 3 weeks after her surgery to review the test results.   Lymphedema issues, if any:   N/A  Pain issues, if any: She is sore from her biopsy, but is doing well  SAFETY ISSUES:  Prior radiation? No  Pacemaker/ICD? No  Possible current pregnancy? No  Is the patient on methotrexate? No  Current Complaints / other details:    BP 108/78   Pulse 76   Temp 98 F (36.7 C)   Ht 5' 7"  (1.702 m)   Wt 265 lb 6.4 oz (120.4 kg)   SpO2 94% Comment: room air  BMI 41.57 kg/m    Wt Readings from Last 3 Encounters:  02/01/16 265 lb 6.4 oz (120.4 kg)  01/31/16 261 lb 11.2 oz (118.7 kg)  12/21/15 264 lb 12.8 oz (120.1 kg)        Holiday Mcmenamin, Stephani Police, RN 01/31/2016,9:49 AM

## 2016-02-01 ENCOUNTER — Ambulatory Visit
Admission: RE | Admit: 2016-02-01 | Discharge: 2016-02-01 | Disposition: A | Discharge status: Home / Self Care | Payer: Medicare Other | Source: Ambulatory Visit | Attending: Radiation Oncology | Admitting: Radiation Oncology

## 2016-02-01 ENCOUNTER — Other Ambulatory Visit: Payer: Self-pay | Admitting: Surgery

## 2016-02-01 ENCOUNTER — Encounter: Payer: Self-pay | Admitting: Radiation Oncology

## 2016-02-01 VITALS — BP 108/78 | HR 76 | Temp 98.0°F | Ht 67.0 in | Wt 265.4 lb

## 2016-02-01 DIAGNOSIS — Z170 Estrogen receptor positive status [ER+]: Secondary | ICD-10-CM | POA: Diagnosis not present

## 2016-02-01 DIAGNOSIS — C50511 Malignant neoplasm of lower-outer quadrant of right female breast: Secondary | ICD-10-CM

## 2016-02-01 DIAGNOSIS — F40240 Claustrophobia: Secondary | ICD-10-CM | POA: Diagnosis not present

## 2016-02-01 DIAGNOSIS — E7800 Pure hypercholesterolemia, unspecified: Secondary | ICD-10-CM | POA: Diagnosis not present

## 2016-02-01 DIAGNOSIS — Z9889 Other specified postprocedural states: Secondary | ICD-10-CM | POA: Diagnosis not present

## 2016-02-01 DIAGNOSIS — Z7984 Long term (current) use of oral hypoglycemic drugs: Secondary | ICD-10-CM | POA: Diagnosis not present

## 2016-02-01 DIAGNOSIS — E1140 Type 2 diabetes mellitus with diabetic neuropathy, unspecified: Secondary | ICD-10-CM | POA: Diagnosis not present

## 2016-02-01 DIAGNOSIS — Z7982 Long term (current) use of aspirin: Secondary | ICD-10-CM | POA: Diagnosis not present

## 2016-02-01 DIAGNOSIS — C50911 Malignant neoplasm of unspecified site of right female breast: Secondary | ICD-10-CM

## 2016-02-01 DIAGNOSIS — F319 Bipolar disorder, unspecified: Secondary | ICD-10-CM | POA: Diagnosis not present

## 2016-02-01 DIAGNOSIS — I10 Essential (primary) hypertension: Secondary | ICD-10-CM | POA: Diagnosis not present

## 2016-02-01 DIAGNOSIS — Z87891 Personal history of nicotine dependence: Secondary | ICD-10-CM | POA: Diagnosis not present

## 2016-02-01 DIAGNOSIS — Z79899 Other long term (current) drug therapy: Secondary | ICD-10-CM | POA: Diagnosis not present

## 2016-02-01 NOTE — Addendum Note (Signed)
Encounter addended by: Ernst Spell, RN on: 02/01/2016  8:44 AM<BR>    Actions taken: Charge Capture section accepted

## 2016-02-01 NOTE — Progress Notes (Signed)
Radiation Oncology         [phone] ________________________________  Initial Outpatient Consultation  Name: Rhonda Berg MRN: [id number]  Date: 02/01/2016  DOB: 03/10/1941  RX:[id number], MD  Erroll Luna, MD   REFERRING PHYSICIAN: Erroll Luna, MD  DIAGNOSIS:    ICD-9-CM ICD-10-CM   1. 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 female who presented with a palpable mass in her right breast. Diagnotic mammogram on 01/11/16 revealed highly suspicious masses at 8 o'clock of the right breast. On mammogram, the extent was estimated to be 7 cm. Ultrasound of the breast that day estimated extent to be 3.8 cm. There was no axillary adenopathy appreciated. The patient had too much claustrophobia to pursue an MRI. Biopsy on 01/13/16 of two sites in the 8 o'clock region of the right breast both revealed grade II invasive and in situ carcinoma.This was ER (100%), PR (100%), Her2 ( -). Yesterday, the patient saw Dr. Burr Medico. They discussed ordering a mammaprint or oncotype DX test after she undergoes breast conserving surgery.  Of note, although the patient's extend to disease was on the order of 7 cm on mammogram, the masses taken individually measure 16 mm and 9 mm. The patient has bipolar disorder which is controlled with medication.  The patient denies any lymphedema issues at this time. She notes soreness from her biopsy, but denies other pain at this time.   She recently moved here from [place] to live with her daughter. Her grandson is the quarterback on the [employer] football team.   She denies weight loss, she is a nonsmoker.   PREVIOUS RADIATION THERAPY: No  PAST MEDICAL HISTORY:  has a past medical history of Bipolar 1  disorder ([place]); Depression; Diabetes mellitus without complication ([place]); Diabetic neuropathy ([place]); Hypercholesteremia; Hypertension; Peripheral edema; and Proteinuria.    PAST SURGICAL HISTORY: Past Surgical History:  Procedure Laterality Date  . ABDOMINAL HYSTERECTOMY    . COLONOSCOPY    . KNEE SURGERY      FAMILY HISTORY: family history includes Cancer in her cousin, mother, and sister; Cancer (age of onset: 63) in her maternal aunt.  SOCIAL HISTORY:  reports that she quit smoking about 13 years ago. She has a 10.00 pack-year smoking history. She has never used smokeless tobacco. She reports that she uses drugs, including Cocaine. She reports that she does not drink alcohol.  ALLERGIES: Review of patient's allergies indicates no known allergies.  MEDICATIONS:  Current Outpatient Prescriptions  Medication Sig Dispense Refill  . albuterol (PROVENTIL HFA;VENTOLIN HFA) 108 (90 Base) MCG/ACT inhaler Inhale 2 puffs into the lungs every 6 (six) hours as needed for wheezing or shortness of breath. 1 Inhaler 6  . amantadine (SYMMETREL) 100 MG capsule Take 100 mg by mouth 2 (two) times daily.    [name] aspirin 81 MG tablet Take 81 mg by mouth daily.    . Blood Glucose Monitoring Suppl MISC Pt uses one touch verio flex. Please dispense test strips #100 and lancets #100. She tests her sugar 2-3x daily 100 each 3  . clonazePAM (KLONOPIN) 1 MG tablet Take 1 mg by mouth at bedtime.    . furosemide (LASIX) 40 MG tablet Take 1 tablet (40 mg total) by mouth daily. Welch  tablet 6  . gabapentin (NEURONTIN) 600 MG tablet Take 600 mg by mouth 4 (four) times daily.     . metFORMIN (GLUCOPHAGE) 500 MG tablet Take 1 tablet (500 mg total) by mouth 2 (two) times daily with a meal. 60 tablet 6  . mirtazapine (REMERON) 15 MG tablet Take 15 mg by mouth at bedtime.    . propranolol (INDERAL) 10 MG tablet Take 10 mg by mouth 2 (two) times daily.     . QUEtiapine (SEROQUEL) 200 MG tablet Take 1 tablet by mouth at bedtime.     . simvastatin (ZOCOR) 40 MG tablet Take 1 tablet (40 mg total) by mouth every evening. 30 tablet 6  . VENTOLIN HFA 108 (90 Base) MCG/ACT inhaler Inhale 2 puffs into the lungs every 6 (six) hours.    . Amantadine HCl 100 MG tablet      No current facility-administered medications for this encounter.     REVIEW OF SYSTEMS:  Notable for that above. She reports she is otherwise in her USOH and denies any other issues.   PHYSICAL EXAM:  height is 5' 7"  (1.702 m) and weight is 265 lb 6.4 oz (120.4 kg). Her temperature is 98 F (36.7 C). Her blood pressure is 108/78 and her pulse is 76. Her oxygen saturation is 94%.   General: Alert and oriented, in no acute distress HEENT: Head is normocephalic. Extraocular movements are intact. Oropharynx is clear. She has upper and lower dentures. Neck: Neck is supple, no palpable cervical or supraclavicular lymphadenopathy. Heart: Regular in rate and rhythm with no murmurs, rubs, or gallops. Chest: Clear to auscultation bilaterally, with no rhonchi, wheezes, or rales. Abdomen: Soft, nontender, nondistended, with no rigidity or guarding. Extremities: No cyanosis or edema. Lymphatics: see Neck Exam Skin: No concerning lesions. Musculoskeletal: symmetric strength and muscle tone throughout. Neurologic: Cranial nerves II through XII are grossly intact. No obvious focalities. Speech is fluent. Coordination is intact. Psychiatric: Judgment and insight are intact. Affect is appropriate. Breasts: No palpable masses in left axilla or left breast. Just to the 8 o'clock aspect of the right areola, there is a palpable mass that extends out inferiorly and laterally by approximately 3 cm. It is difficult to tell how much is tumor or post-biopsy changes. No other palpable masses in the right axilla or breast.   ECOG = 1  0 - Asymptomatic (Fully active, able to carry on all predisease activities without restriction)  1 - Symptomatic but completely ambulatory (Restricted  in physically strenuous activity but ambulatory and able to carry out work of a light or sedentary nature. For example, light housework, office work)  2 - Symptomatic, <50% in bed during the day (Ambulatory and capable of all self care but unable to carry out any work activities. Up and about more than 50% of waking hours)  3 - Symptomatic, >50% in bed, but not bedbound (Capable of only limited self-care, confined to bed or chair 50% or more of waking hours)  4 - Bedbound (Completely disabled. Cannot carry on any self-care. Totally confined to bed or chair)  5 - Death   Eustace Pen MM, Creech RH, Tormey DC, et al. [phone]). "Toxicity and response criteria of the [facility]". [place] Oncol. 5 (6): 649-55   LABORATORY DATA:  Lab Results  Component Value Date   WBC 6.4 04/13/2012   HGB 14.2 04/13/2012   HCT 42.5 04/13/2012   MCV 85.3 04/13/2012   PLT 207 04/13/2012   CMP  Component Value Date/Time   NA 136 11/24/2015 1558   K 4.2 11/24/2015 1558   CL 97 11/24/2015 1558   CO2 31 11/24/2015 1558   GLUCOSE 351 (H) 11/24/2015 1558   BUN 8 11/24/2015 1558   CREATININE 0.98 11/24/2015 1558   CALCIUM 9.5 11/24/2015 1558   PROT 7.1 11/24/2015 1558   ALBUMIN 4.1 11/24/2015 1558   AST 25 11/24/2015 1558   ALT 22 11/24/2015 1558   ALKPHOS 92 11/24/2015 1558   BILITOT 0.3 11/24/2015 1558   GFRNONAA 86 (L) 04/13/2012 1110   GFRAA >90 04/13/2012 1110         RADIOGRAPHY: Mm Digital Diagnostic Unilat R  Result Date: 01/13/2016 CLINICAL DATA:  Patient with multiple right breast masses, status post 2 ultrasound-guided biopsies today. EXAM: DIAGNOSTIC RIGHT MAMMOGRAM POST ULTRASOUND BIOPSY (2 biopsies) COMPARISON:  Previous exam(s). FINDINGS: Mammographic images were obtained following ultrasound guided biopsy of suspicious masses within the right breast at the 8 o'clock axis, 1 cm from the nipple, and at the 8 o'clock axis, 2 cm from the nipple. After biopsy at the 8  o'clock axis, 1 cm from nipple, a ribbon shaped tissue marker was deployed into the biopsy cavity. After biopsy at the 8 o'clock axis, 2 cm from the nipple, a coil shaped tissue marker was deployed into the biopsy cavity. Both clips appear appropriately positioned on this postprocedure mammogram. IMPRESSION: Postprocedure mammogram for clip placements. The 2 biopsy clips appear appropriately positioned at their respective biopsy sites. Final Assessment: Post Procedure Mammograms for Marker Placement Electronically Signed   By: Franki Cabot M.D.   On: 01/13/2016 14:47   US Breast Ltd Uni Left Inc Axilla  Result Date: 01/11/2016 CLINICAL DATA:  Palpable lump in the right breast EXAM: 2D DIGITAL DIAGNOSTIC BILATERAL MAMMOGRAM WITH CAD AND ADJUNCT TOMO ULTRASOUND BILATERAL BREAST COMPARISON:  Previous exam(s). ACR Breast Density Category b: There are scattered areas of fibroglandular density. FINDINGS: There is increasing density with underlying small masses in the right central breast correlating with the patient's symptoms. The increased density on the right spans up to 7 cm. Possible distortion in the upper outer left breast resolves on additional imaging. No other suspicious findings. Mammographic images were processed with CAD. On physical exam, a palpable lump is identified on the right. Targeted ultrasound is performed, showing multiple small masses in the region of the patient's symptoms. A representative mass is seen at 8 o'clock, 2 cm from the nipple measuring 16 x 8 x 15 mm. An adjacent mass is seen at 8 o'clock, 1 cm from the nipple measuring 9 x 8 x 9 mm. Taken together, the 2 masses span a dimension of 3.2 cm on anti radial images and 3.8 cm on radial imaging. No axillary adenopathy. No suspicious findings in the upper outer left breast. IMPRESSION: Highly suspicious masses with associated distortion centered at 8 o'clock in the right breast. Sonographically, the maximum extent of disease is 3.8 cm.  Mammographically, I suspect the extent of disease may be on the order of 7 cm. No evidence of malignancy on the left. RECOMMENDATION: Recommend biopsying the mass at 8 o'clock, 2 cm from the nipple in the right breast and the mass at 8 o'clock, 1 cm from the nipple in the right breast. Assuming a diagnosis of malignancy, breast MRI would be well suited to evaluate extent of disease. However, the patient is unsure whether she can complete a breast MRI due to severe claustrophobia. I have discussed the findings and recommendations  with the patient. Results were also provided in writing at the conclusion of the visit. If applicable, a reminder letter will be sent to the patient regarding the next appointment. BI-RADS CATEGORY  4: Suspicious. Electronically Signed   By: Dorise Bullion III M.D   On: 01/11/2016 11:32   US Breast Ltd Uni Right Inc Axilla  Result Date: 01/11/2016 CLINICAL DATA:  Palpable lump in the right breast EXAM: 2D DIGITAL DIAGNOSTIC BILATERAL MAMMOGRAM WITH CAD AND ADJUNCT TOMO ULTRASOUND BILATERAL BREAST COMPARISON:  Previous exam(s). ACR Breast Density Category b: There are scattered areas of fibroglandular density. FINDINGS: There is increasing density with underlying small masses in the right central breast correlating with the patient's symptoms. The increased density on the right spans up to 7 cm. Possible distortion in the upper outer left breast resolves on additional imaging. No other suspicious findings. Mammographic images were processed with CAD. On physical exam, a palpable lump is identified on the right. Targeted ultrasound is performed, showing multiple small masses in the region of the patient's symptoms. A representative mass is seen at 8 o'clock, 2 cm from the nipple measuring 16 x 8 x 15 mm. An adjacent mass is seen at 8 o'clock, 1 cm from the nipple measuring 9 x 8 x 9 mm. Taken together, the 2 masses span a dimension of 3.2 cm on anti radial images and 3.8 cm on radial  imaging. No axillary adenopathy. No suspicious findings in the upper outer left breast. IMPRESSION: Highly suspicious masses with associated distortion centered at 8 o'clock in the right breast. Sonographically, the maximum extent of disease is 3.8 cm. Mammographically, I suspect the extent of disease may be on the order of 7 cm. No evidence of malignancy on the left. RECOMMENDATION: Recommend biopsying the mass at 8 o'clock, 2 cm from the nipple in the right breast and the mass at 8 o'clock, 1 cm from the nipple in the right breast. Assuming a diagnosis of malignancy, breast MRI would be well suited to evaluate extent of disease. However, the patient is unsure whether she can complete a breast MRI due to severe claustrophobia. I have discussed the findings and recommendations with the patient. Results were also provided in writing at the conclusion of the visit. If applicable, a reminder letter will be sent to the patient regarding the next appointment. BI-RADS CATEGORY  4: Suspicious. Electronically Signed   By: Dorise Bullion III M.D   On: 01/11/2016 11:32   Mm Diag Breast Tomo Bilateral  Result Date: 01/11/2016 CLINICAL DATA:  Palpable lump in the right breast EXAM: 2D DIGITAL DIAGNOSTIC BILATERAL MAMMOGRAM WITH CAD AND ADJUNCT TOMO ULTRASOUND BILATERAL BREAST COMPARISON:  Previous exam(s). ACR Breast Density Category b: There are scattered areas of fibroglandular density. FINDINGS: There is increasing density with underlying small masses in the right central breast correlating with the patient's symptoms. The increased density on the right spans up to 7 cm. Possible distortion in the upper outer left breast resolves on additional imaging. No other suspicious findings. Mammographic images were processed with CAD. On physical exam, a palpable lump is identified on the right. Targeted ultrasound is performed, showing multiple small masses in the region of the patient's symptoms. A representative mass is seen  at 8 o'clock, 2 cm from the nipple measuring 16 x 8 x 15 mm. An adjacent mass is seen at 8 o'clock, 1 cm from the nipple measuring 9 x 8 x 9 mm. Taken together, the 2 masses span a dimension of 3.2  cm on anti radial images and 3.8 cm on radial imaging. No axillary adenopathy. No suspicious findings in the upper outer left breast. IMPRESSION: Highly suspicious masses with associated distortion centered at 8 o'clock in the right breast. Sonographically, the maximum extent of disease is 3.8 cm. Mammographically, I suspect the extent of disease may be on the order of 7 cm. No evidence of malignancy on the left. RECOMMENDATION: Recommend biopsying the mass at 8 o'clock, 2 cm from the nipple in the right breast and the mass at 8 o'clock, 1 cm from the nipple in the right breast. Assuming a diagnosis of malignancy, breast MRI would be well suited to evaluate extent of disease. However, the patient is unsure whether she can complete a breast MRI due to severe claustrophobia. I have discussed the findings and recommendations with the patient. Results were also provided in writing at the conclusion of the visit. If applicable, a reminder letter will be sent to the patient regarding the next appointment. BI-RADS CATEGORY  4: Suspicious. Electronically Signed   By: Dorise Bullion III M.D   On: 01/11/2016 11:32   [place] Rt Breast Bx W Loc Dev 1st Lesion Img Bx Spec US Guide  Addendum Date: 01/16/2016   ADDENDUM REPORT: 01/16/2016 14:09 ADDENDUM: Pathology revealed GRADE II INVASIVE DUCTAL CARCINOMA, INTERMEDIATE GRADE DUCTAL CARCINOMA IN SITU of the Right breast at both 8:00 o'clock locations, 1 CMFN and 2 CMFN. This was found to be concordant by Dr. Franki Cabot. Pathology results were discussed with the patient by telephone. The patient reported doing well after the biopsies with tenderness at the sites. Post biopsy instructions and care were reviewed and questions were answered. The patient was encouraged to call [facility] for any additional concerns. Surgical consultation has been arranged with Dr. Erroll Luna at [facility] on January 23, 2016. Recommend MRI to evaluate extent of disease, however, the patient is unsure whether she can complete a breast MRI due to severe claustrophobia. Pathology results reported by Terie Purser, RN on 01/16/2016. Electronically Signed   By: Franki Cabot M.D.   On: 01/16/2016 14:09   Result Date: 01/16/2016 CLINICAL DATA:  Patient with multiple masses within the lower outer quadrant of the right breast. Two sites recommended for biopsy. EXAM: ULTRASOUND GUIDED RIGHT BREAST CORE NEEDLE BIOPSY x2 COMPARISON:  Previous exam(s). PROCEDURE: I met with the patient and we discussed the procedure of ultrasound-guided biopsy, including benefits and alternatives. We discussed the high likelihood of a successful procedure. We discussed the risks of the procedure including infection, bleeding, tissue injury, clip migration, and inadequate sampling. Informed written consent was given. The usual time-out protocol was performed immediately prior to the procedure. Using sterile technique and 1% Lidocaine as local anesthetic, under direct ultrasound visualization, a 12 gauge spring-loaded device was used to perform biopsy of the right breast mass at the 8 o'clock axis, 1 cm from the nipple,using an inferolateral approach. At the conclusion of the procedure, a ribbon shaped tissue marker clip was deployed into the biopsy cavity. Next, using sterile technique and 1% lidocaine as local anesthetic, under direct ultrasound visualization, a 12 gauge spring-loaded device was used to perform biopsy of the right breast mass at the 8 o'clock axis, 2 cm from the nipple, using a lateral approach. At the conclusion of the procedure, a coil shaped tissue marker clip was deployed in the biopsy cavity. Follow-up 2-view mammogram was performed and dictated separately. IMPRESSION:  Ultrasound-guided biopsy of 2  masses within the right breast at the 8 o'clock axis, 1 cm from the nipple and 2 cm from the nipple respectively. No apparent complications. Electronically Signed: By: Franki Cabot M.D. On: 01/13/2016 14:28   [place] Rt Breast Bx W Loc Dev Ea Add Lesion Img Bx Spec US Guide  Addendum Date: 01/16/2016   ADDENDUM REPORT: 01/16/2016 14:09 ADDENDUM: Pathology revealed GRADE II INVASIVE DUCTAL CARCINOMA, INTERMEDIATE GRADE DUCTAL CARCINOMA IN SITU of the Right breast at both 8:00 o'clock locations, 1 CMFN and 2 CMFN. This was found to be concordant by Dr. Franki Cabot. Pathology results were discussed with the patient by telephone. The patient reported doing well after the biopsies with tenderness at the sites. Post biopsy instructions and care were reviewed and questions were answered. The patient was encouraged to call [facility] for any additional concerns. Surgical consultation has been arranged with Dr. Erroll Luna at [facility] on January 23, 2016. Recommend MRI to evaluate extent of disease, however, the patient is unsure whether she can complete a breast MRI due to severe claustrophobia. Pathology results reported by Terie Purser, RN on 01/16/2016. Electronically Signed   By: Franki Cabot M.D.   On: 01/16/2016 14:09   Result Date: 01/16/2016 CLINICAL DATA:  Patient with multiple masses within the lower outer quadrant of the right breast. Two sites recommended for biopsy. EXAM: ULTRASOUND GUIDED RIGHT BREAST CORE NEEDLE BIOPSY x2 COMPARISON:  Previous exam(s). PROCEDURE: I met with the patient and we discussed the procedure of ultrasound-guided biopsy, including benefits and alternatives. We discussed the high likelihood of a successful procedure. We discussed the risks of the procedure including infection, bleeding, tissue injury, clip migration, and inadequate sampling. Informed written consent was given. The usual time-out protocol  was performed immediately prior to the procedure. Using sterile technique and 1% Lidocaine as local anesthetic, under direct ultrasound visualization, a 12 gauge spring-loaded device was used to perform biopsy of the right breast mass at the 8 o'clock axis, 1 cm from the nipple,using an inferolateral approach. At the conclusion of the procedure, a ribbon shaped tissue marker clip was deployed into the biopsy cavity. Next, using sterile technique and 1% lidocaine as local anesthetic, under direct ultrasound visualization, a 12 gauge spring-loaded device was used to perform biopsy of the right breast mass at the 8 o'clock axis, 2 cm from the nipple, using a lateral approach. At the conclusion of the procedure, a coil shaped tissue marker clip was deployed in the biopsy cavity. Follow-up 2-view mammogram was performed and dictated separately. IMPRESSION: Ultrasound-guided biopsy of 2 masses within the right breast at the 8 o'clock axis, 1 cm from the nipple and 2 cm from the nipple respectively. No apparent complications. Electronically Signed: By: Franki Cabot M.D. On: 01/13/2016 14:28      IMPRESSION/PLAN: multifocal right breast cancer, Stage IA ER+  It was a pleasure meeting the patient today. We discussed the risks, benefits, and side effects of radiotherapy. I recommend radiotherapy to the right breast to reduce her risk of locoregional recurrence by 2/3.  We discussed that radiation would take approximately 7 weeks to complete and that I would give the patient a few weeks to heal following surgery before starting treatment planning. If chemotherapy were to be given, this would precede radiotherapy. We spoke about acute effects including skin irritation and fatigue as well as much less common late effects including internal organ injury or irritation. We spoke about the latest technology that is  used to minimize the risk of late effects for patients undergoing radiotherapy to the breast or chest wall. No  guarantees of treatment were given. The patient is enthusiastic about proceeding with treatment. I look forward to participating in the patient's care. Consent signed today.  __________________________________________   Eppie Gibson, MD  This document serves as a record of services personally performed by Eppie Gibson, MD. It was created on her behalf by Bethann Humble, a trained medical scribe. The creation of this record is based on the scribe's personal observations and the provider's statements to them. This document has been checked and approved by the attending provider.

## 2016-02-01 NOTE — Addendum Note (Signed)
Encounter addended by: Ernst Spell, RN on: 02/01/2016  8:49 AM<BR>    Actions taken: Visit Navigator Flowsheet section accepted

## 2016-02-13 ENCOUNTER — Telehealth: Payer: Self-pay | Admitting: Hematology

## 2016-02-13 NOTE — Telephone Encounter (Signed)
lvm to inform pt of 12/13 appt date/time per LOS

## 2016-02-15 NOTE — Pre-Procedure Instructions (Signed)
Rhonda Berg  02/15/2016      [id number], [place] [address] Phone: [phone] Fax: [phone]    Your procedure is scheduled on Wednesday November 15.  Report to [facility] Admitting at 6:30 A.M.  Call this number if you have problems the morning of surgery:  [phone]   Remember:  Do not eat food or drink liquids after midnight.  Take these medicines the morning of surgery with A SIP OF WATER: albuterol inhaler if needed (please bring to hospital with you), amantidine (symmetrel), gabapentin (neurontin), propranolol (Inderal)  7 days prior to surgery STOP taking any Aspirin, Aleve, Naproxen, Ibuprofen, Motrin, Advil, Goody's, BC's, all herbal medications, fish oil, and all vitamins     How to Manage Your Diabetes Before and After Surgery  Why is it important to control my blood sugar before and after surgery? . Improving blood sugar levels before and after surgery helps healing and can limit problems. . A way of improving blood sugar control is eating a healthy diet by: o  Eating less sugar and carbohydrates o  Increasing activity/exercise o  Talking with your doctor about reaching your blood sugar goals . High blood sugars (greater than 180 mg/dL) can raise your risk of infections and slow your recovery, so you will need to focus on controlling your diabetes during the weeks before surgery. . Make sure that the doctor who takes care of your diabetes knows about your planned surgery including the date and location.  How do I manage my blood sugar before surgery? . Check your blood sugar at least 4 times a day, starting 2 days before surgery, to make sure that the level is not too high or low. o Check your blood sugar the morning of your surgery when you wake up and every 2 hours until you get to the Short Stay unit. . If your blood sugar is less than 70 mg/dL, you  will need to treat for low blood sugar: o Do not take insulin. o Treat a low blood sugar (less than 70 mg/dL) with  cup of clear juice (cranberry or apple), 4 glucose tablets, OR glucose gel. o Recheck blood sugar in 15 minutes after treatment (to make sure it is greater than 70 mg/dL). If your blood sugar is not greater than 70 mg/dL on recheck, call [phone] for further instructions. . Report your blood sugar to the short stay nurse when you get to Short Stay.  . If you are admitted to the hospital after surgery: o Your blood sugar will be checked by the staff and you will probably be given insulin after surgery (instead of oral diabetes medicines) to make sure you have good blood sugar levels. o The goal for blood sugar control after surgery is 80-180 mg/dL.              WHAT DO I DO ABOUT MY DIABETES MEDICATION?   [name] Do not take oral diabetes medicines (pills) the morning of surgery.  DO NOT take metformin (glucophage) the day of surgery.     Do not wear jewelry, make-up or nail polish.  Do not wear lotions, powders, or perfumes, or deoderant.  Do not shave 48 hours prior to surgery.  Men may shave face and neck.  Do not bring valuables to the hospital.  [facility] is not responsible for any belongings or valuables.  Contacts, dentures or bridgework may not  be worn into surgery.  Leave your suitcase in the car.  After surgery it may be brought to your room.  For patients admitted to the hospital, discharge time will be determined by your treatment team.  Patients discharged the day of surgery will not be allowed to drive home.    Special instructions:   [place]- Preparing For Surgery  Before surgery, you can play an important role. Because skin is not sterile, your skin needs to be as free of germs as possible. You can reduce the number of germs on your skin by washing with CHG (chlorahexidine gluconate) Soap before surgery.  CHG is an antiseptic cleaner which  kills germs and bonds with the skin to continue killing germs even after washing.  Please do not use if you have an allergy to CHG or antibacterial soaps. If your skin becomes reddened/irritated stop using the CHG.  Do not shave (including legs and underarms) for at least 48 hours prior to first CHG shower. It is OK to shave your face.  Please follow these instructions carefully.   1. Shower the NIGHT BEFORE SURGERY and the MORNING OF SURGERY with CHG.   2. If you chose to wash your hair, wash your hair first as usual with your normal shampoo.  3. After you shampoo, rinse your hair and body thoroughly to remove the shampoo.  4. Use CHG as you would any other liquid soap. You can apply CHG directly to the skin and wash gently with a scrungie or a clean washcloth.   5. Apply the CHG Soap to your body ONLY FROM THE NECK DOWN.  Do not use on open wounds or open sores. Avoid contact with your eyes, ears, mouth and genitals (private parts). Wash genitals (private parts) with your normal soap.  6. Wash thoroughly, paying special attention to the area where your surgery will be performed.  7. Thoroughly rinse your body with warm water from the neck down.  8. DO NOT shower/wash with your normal soap after using and rinsing off the CHG Soap.  9. Pat yourself dry with a CLEAN TOWEL.   10. Wear CLEAN PAJAMAS   11. Place CLEAN SHEETS on your bed the night of your first shower and DO NOT SLEEP WITH PETS.    Day of Surgery: Do not apply any deodorants/lotions. Please wear clean clothes to the hospital/surgery center.

## 2016-02-16 ENCOUNTER — Encounter (HOSPITAL_COMMUNITY): Payer: Self-pay

## 2016-02-16 ENCOUNTER — Telehealth: Payer: Self-pay | Admitting: Family Medicine

## 2016-02-16 ENCOUNTER — Encounter (HOSPITAL_COMMUNITY)
Admission: RE | Admit: 2016-02-16 | Discharge: 2016-02-16 | Disposition: A | Discharge status: Home / Self Care | Payer: Medicare Other | Source: Ambulatory Visit | Attending: Surgery | Admitting: Surgery

## 2016-02-16 DIAGNOSIS — Z01810 Encounter for preprocedural cardiovascular examination: Secondary | ICD-10-CM | POA: Diagnosis not present

## 2016-02-16 DIAGNOSIS — Z01812 Encounter for preprocedural laboratory examination: Secondary | ICD-10-CM | POA: Insufficient documentation

## 2016-02-16 DIAGNOSIS — C50911 Malignant neoplasm of unspecified site of right female breast: Secondary | ICD-10-CM | POA: Diagnosis not present

## 2016-02-16 HISTORY — DX: Malignant (primary) neoplasm, unspecified: C80.1

## 2016-02-16 HISTORY — DX: Unspecified osteoarthritis, unspecified site: M19.90

## 2016-02-16 HISTORY — DX: Sleep apnea, unspecified: G47.30

## 2016-02-16 LAB — CBC WITH DIFFERENTIAL/PLATELET
Basophils Absolute: 0 10*3/uL (ref 0.0–0.1)
Basophils Relative: 1 %
Eosinophils Absolute: 0.1 10*3/uL (ref 0.0–0.7)
Eosinophils Relative: 1 %
HEMATOCRIT: 47.2 % — AB (ref 36.0–46.0)
HEMOGLOBIN: 15.8 g/dL — AB (ref 12.0–15.0)
LYMPHS ABS: 3.9 10*3/uL (ref 0.7–4.0)
LYMPHS PCT: 58 %
MCH: 28.8 pg (ref 26.0–34.0)
MCHC: 33.5 g/dL (ref 30.0–36.0)
MCV: 86 fL (ref 78.0–100.0)
Monocytes Absolute: 0.3 10*3/uL (ref 0.1–1.0)
Monocytes Relative: 5 %
NEUTROS PCT: 35 %
Neutro Abs: 2.3 10*3/uL (ref 1.7–7.7)
Platelets: 179 10*3/uL (ref 150–400)
RBC: 5.49 MIL/uL — AB (ref 3.87–5.11)
RDW: 13.7 % (ref 11.5–15.5)
WBC: 6.7 10*3/uL (ref 4.0–10.5)

## 2016-02-16 LAB — COMPREHENSIVE METABOLIC PANEL
ALK PHOS: 93 U/L (ref 38–126)
ALT: 26 U/L (ref 14–54)
AST: 27 U/L (ref 15–41)
Albumin: 4.4 g/dL (ref 3.5–5.0)
Anion gap: 16 — ABNORMAL HIGH (ref 5–15)
BILIRUBIN TOTAL: 0.8 mg/dL (ref 0.3–1.2)
BUN: 17 mg/dL (ref 6–20)
CALCIUM: 10.1 mg/dL (ref 8.9–10.3)
CO2: 23 mmol/L (ref 22–32)
CREATININE: 0.91 mg/dL (ref 0.44–1.00)
Chloride: 96 mmol/L — ABNORMAL LOW (ref 101–111)
Glucose, Bld: 311 mg/dL — ABNORMAL HIGH (ref 65–99)
Potassium: 4.4 mmol/L (ref 3.5–5.1)
Sodium: 135 mmol/L (ref 135–145)
Total Protein: 7.5 g/dL (ref 6.5–8.1)

## 2016-02-16 LAB — GLUCOSE, CAPILLARY: Glucose-Capillary: 314 mg/dL — ABNORMAL HIGH (ref 65–99)

## 2016-02-16 MED ORDER — CHLORHEXIDINE GLUCONATE CLOTH 2 % EX PADS: 6.0000 | MEDICATED_PAD | Freq: Once | CUTANEOUS | Status: DC

## 2016-02-16 MED ORDER — BASAGLAR KWIKPEN 100 UNIT/ML ~~LOC~~ SOPN: PEN_INJECTOR | SUBCUTANEOUS | 3 refills | Status: DC

## 2016-02-16 NOTE — Telephone Encounter (Signed)
-----   Message from Rhonda Berg, [place] sent at 02/16/2016  3:32 PM EST ----- Regarding: Elevated blood sugar Pt called in to report that her blood sugar has been elevated and she's not sure why. Pt states that she diets as directed. Blood sugar today is 314 and pt has only had yogurt and small cup of jello. Pt would like a call back to discuss what she should do to get her blood sugar under control. Please advise.

## 2016-02-16 NOTE — Telephone Encounter (Signed)
Called her back to discuss.  Last seen about 2 months ago- we started her on basaglar as her A1c was very high in August Lab Results  Component Value Date   HGBA1C 11.5 (H) 11/24/2015   She did use her once a day insulin but when it ran out she stopped using it.  She ran out a few weeks ago.  She reports that she called the pharmacy and our office to see if she was supposed to continue this but did not get an answer.  She feels fine except her glucose is in the 300s. Will call in an rx for her basaglar at 10 u daily and she will see me next week- made her an appt for Monday for follw-up She is also having surgery for breast cancer next week so would like to get her glucose under control if we can

## 2016-02-16 NOTE — Progress Notes (Signed)
PCP: Silvestre Mesi, MD Sleep study- ordered by Dr. Orland Penman in [place], requested from office No cardiologist or cardiac workup per pt.   Pt CBG 314 today, pt had yogurt and sugar free jello this AM for breakfast, has not had lunch. Pt states her fasting cbgs in 200s. Last A1c in august was 11.5. A1c for redraw today. Pt states she takes her medicine and has tried to change her diet. Pt states she will call her PCP regarding her Diabetes prior to surgery. Chart forwarded to anesthesia for review.   EKG: 02/16/16  No complaints of chest pain, SOB, signs of infection at PAT appointment.

## 2016-02-17 ENCOUNTER — Encounter (HOSPITAL_COMMUNITY): Payer: Self-pay | Admitting: Vascular Surgery

## 2016-02-17 ENCOUNTER — Telehealth: Payer: Self-pay | Admitting: Family Medicine

## 2016-02-17 LAB — HEMOGLOBIN A1C
HEMOGLOBIN A1C: 12 % — AB (ref 4.8–5.6)
MEAN PLASMA GLUCOSE: 298 mg/dL

## 2016-02-17 NOTE — Progress Notes (Addendum)
Anesthesia Chart Review: Patient is a 75 year old female scheduled for right breast seed lumpectomy with radioactive seed and right sentinel LN mapping on 02/22/16 (8:30 AM) by Dr. Brantley Stage. Anesthesia is posted for GA with pectoral block. She is scheduled for radioactive seed implant on 02/21/16 at 12:45 PM.  History includes former smoker, DM2 with neuropathy, right breast cancer, HTN, hypercholesterolemia, peripheral edema, OSA (CPAP non-compliance), Bipolar 1 disorder, hysterectomy. BMI is consistent with morbid obesity.   PCP is Dr. Janett Billow Copland. Patient is scheduled to see Dr. Lorelei Pont on 02/20/16 at 3:45 PM to address hyperglycemia. She would like to get patient's glucose better controlled prior to surgery.   HEM-ONC is Dr. Burr Medico. RAD-ONC is Dr. Isidore Moos.  Sleep Study requested from Dr. Orland Penman in [place], [place], but no sleep study report received.   Meds include albuterol, amantadine, ASA 81mg , clonazepam, Lasix, gabapentin, insulin glargine, metformin, Remeron, MVI, Inderal, Seroquel, Zocor. According to 02/16/16 telephone encounter, patient was started on glargine approximately two months ago but quit taking it when she ran out. Dr. Lorelei Pont called in another prescription for glargine on 02/16/16 and will see patient back in the office on 02/20/16.   BP 116/76   Pulse 71   Temp 36.8 C   Resp 18   Ht 5\' 7"  (1.702 m)   Wt 259 lb 9.6 oz (117.8 kg)   SpO2 95%   BMI 40.66 kg/m   02/16/16 EKG: NSR.  Preoperative labs noted. Cr 0.91, H/H 15.8/47.2. Glucose 331 with A1c 12.0 (up from 11.5 on 11/24/15) consistent with a mean plasma glucose 298. She reported fasting CBGs in the 200's. Elevated A1c results called to [facility] triage RN Sunday Spillers. She will review with Dr. Brantley Stage for recommendations. If radioactive seed implant and surgery remains as scheduled, surgery start time could be delayed if she presents with glucose > 250 (as she would need to receive SQ or IV insulin prior to surgery). Discussed  with anesthesiologist Dr. Linna Caprice.   George Hugh [facility] Short Stay Center/Anesthesiology Phone [phone] 02/17/2016 12:31 PM

## 2016-02-17 NOTE — Telephone Encounter (Signed)
Discussed with Norvel Richards- yes, ok to change to lantus.  Thank you!

## 2016-02-17 NOTE — Telephone Encounter (Signed)
Prior authorization request pharmacist comments states that we can submit a prior authorization for Basaglar 100 Unit/ML or change to Lantus Solostar. Spoke to provider and it is ok to change to Lantus.   Called pharmacy to authorize changing rx to Lantus. Also called pt to inform rx has been changed to Lantus and can be picked up at her pharmacy later today. Pt verbalized understanding.

## 2016-02-17 NOTE — Telephone Encounter (Signed)
-----   Message from Emi Holes, [place] sent at 02/17/2016 11:23 AM EST ----- Regarding: Prior Authorization for BASAGLAR 100 UNIT/ML Hi Dr. Lorelei Pont,  I received Prior Authorization request for Basaglar 100 unit/ ml kwikpen. Per the pharmacist we can complete the prior authorization request for change the script to Lantus Solostar. Would you like me to proceed with the Prior Auth for Basaglar or change the script to Lantus? Please advise.

## 2016-02-17 NOTE — Telephone Encounter (Signed)
Patient called stating that a PA is required for Insulin Glargine [facility]) 100 UNIT/ML SOPN Please advise   Patient phone: [phone]

## 2016-02-17 NOTE — Telephone Encounter (Signed)
Called pt to inform her that I received the prior authorization request and will start the process now.

## 2016-02-20 ENCOUNTER — Ambulatory Visit (INDEPENDENT_AMBULATORY_CARE_PROVIDER_SITE_OTHER): Payer: Medicare Other | Admitting: Family Medicine

## 2016-02-20 ENCOUNTER — Encounter: Payer: Self-pay | Admitting: Family Medicine

## 2016-02-20 VITALS — BP 110/72 | HR 71 | Temp 98.4°F | Ht 67.0 in | Wt 261.4 lb

## 2016-02-20 DIAGNOSIS — E889 Metabolic disorder, unspecified: Secondary | ICD-10-CM | POA: Diagnosis not present

## 2016-02-20 DIAGNOSIS — IMO0002 Reserved for concepts with insufficient information to code with codable children: Secondary | ICD-10-CM

## 2016-02-20 DIAGNOSIS — Z794 Long term (current) use of insulin: Secondary | ICD-10-CM | POA: Diagnosis not present

## 2016-02-20 DIAGNOSIS — E1140 Type 2 diabetes mellitus with diabetic neuropathy, unspecified: Secondary | ICD-10-CM | POA: Diagnosis not present

## 2016-02-20 DIAGNOSIS — Z86010 Personal history of colonic polyps: Secondary | ICD-10-CM

## 2016-02-20 DIAGNOSIS — G63 Polyneuropathy in diseases classified elsewhere: Secondary | ICD-10-CM

## 2016-02-20 DIAGNOSIS — E1165 Type 2 diabetes mellitus with hyperglycemia: Secondary | ICD-10-CM

## 2016-02-20 DIAGNOSIS — G990 Autonomic neuropathy in diseases classified elsewhere: Secondary | ICD-10-CM

## 2016-02-20 NOTE — Progress Notes (Signed)
[place] at [facility] [address], [place], [place] [address] [phone]  Date:  02/20/2016   Name:  Rhonda Berg   DOB:  02-03-1941   MRN:  [id number]  PCP:  Lamar Blinks, MD    Chief Complaint: Follow-up (Pt here for f/u on diabetes. )   History of Present Illness:  Rhonda Berg is a 75 y.o. very pleasant female patient who presents with the following:  Last seen by myself in the office about 2 months ago to discuss her DM. Unfortunately she was also dx with breast cancer in the last month- see recent notes in Epic  Lab Results  Component Value Date   HGBA1C 12.0 (H) 02/16/2016   We started her on insulin glargine at her last visit. She used the sample pen that I gave her, but ran out and did not get it refilled. Her insurance would not cover the basaglar that she started on- we changed her to lantus which is covered and she just went back on 10 u yesterday She states that she cannot have her breast surgery until her blood sugar is under control- she is motivated to get his done  She states that her sugars were "looking pretty good" when she was on her insulin back in September.   Numbers back into the 300s since she ran out of her insulin She feels "fine" in general but is eager to get her cancer operation.    She did notice some blood in her stool once last week- she would like to see GI as she thinks she is due for a colonscopy soon, she has a history of polyps in the past.   She would also like to see a neurologist- she was being treated by neurology when she lived near the [place] and would like to establish here in [place].  She has polyneuropathy  Patient Active Problem List   Diagnosis Date Noted  . Breast cancer of lower-outer quadrant of right female breast ([place]) 01/30/2016  . Controlled type 2 diabetes mellitus with complication, without long-term current use of insulin ([place]) 11/24/2015  . Peripheral edema  11/24/2015  . Diabetic polyneuropathy associated with type 2 diabetes mellitus ([place]) 11/24/2015  . Chronic back pain 11/24/2015  . Bipolar affective disorder in remission ([place]) 11/24/2015  . Hyperlipidemia 11/24/2015  . Wheezing 11/24/2015    Past Medical History:  Diagnosis Date  . Arthritis   . Bipolar 1 disorder ([place])   . Cancer ([place])    breast  . Depression   . Diabetes mellitus without complication ([place])   . Diabetic neuropathy ([place])   . Hypercholesteremia   . Hypertension   . Peripheral edema   . Proteinuria   . Sleep apnea    has CPAP but doesnt wear it    Past Surgical History:  Procedure Laterality Date  . ABDOMINAL HYSTERECTOMY    . COLONOSCOPY    . KNEE SURGERY     bilateral partial knee replacements    Social History  Substance Use Topics  . Smoking status: Former Smoker    Packs/day: 0.25    Years: 40.00    Quit date: 04/09/2002  . Smokeless tobacco: Never Used  . Alcohol use No     Comment: social drinker     Family History  Problem Relation Age of Onset  . Cancer Mother     breast cancer   . Cancer Sister  breast cancer   . Cancer Maternal Aunt 63    breast cancer   . Cancer Cousin     colon cancer     No Known Allergies  Medication list has been reviewed and updated.  Current Outpatient Prescriptions on File Prior to Visit  Medication Sig Dispense Refill  . albuterol (PROVENTIL HFA;VENTOLIN HFA) 108 (90 Base) MCG/ACT inhaler Inhale 2 puffs into the lungs every 6 (six) hours as needed for wheezing or shortness of breath. 1 Inhaler 6  . amantadine (SYMMETREL) 100 MG capsule Take 100 mg by mouth 2 (two) times daily.    . Amantadine HCl 100 MG tablet Take 100 mg by mouth 2 (two) times daily.     [name] aspirin 81 MG tablet Take 81 mg by mouth daily.    . Blood Glucose Monitoring Suppl MISC Pt uses one touch verio flex. Please dispense test strips #100 and lancets #100. She tests her sugar 2-3x daily 100 each 3  . clonazePAM (KLONOPIN) 0.5 MG  tablet Take 0.5 mg by mouth at bedtime.     . furosemide (LASIX) 40 MG tablet Take 1 tablet (40 mg total) by mouth daily. 30 tablet 6  . gabapentin (NEURONTIN) 600 MG tablet Take 600 mg by mouth 4 (four) times daily.     . Insulin Glargine (BASAGLAR KWIKPEN) 100 UNIT/ML SOPN Start with 10 units subcue once daily 15 mL 3  . metFORMIN (GLUCOPHAGE) 500 MG tablet Take 1 tablet (500 mg total) by mouth 2 (two) times daily with a meal. 60 tablet 6  . mirtazapine (REMERON) 15 MG tablet Take 15 mg by mouth at bedtime.    . Multiple Vitamin (MULTIVITAMIN WITH MINERALS) TABS tablet Take 1 tablet by mouth daily.    . propranolol (INDERAL) 10 MG tablet Take 10 mg by mouth 2 (two) times daily.     . QUEtiapine (SEROQUEL) 200 MG tablet Take 1 tablet by mouth at bedtime.    . simvastatin (ZOCOR) 40 MG tablet Take 1 tablet (40 mg total) by mouth every evening. 30 tablet 6   No current facility-administered medications on file prior to visit.     Review of Systems:  As per HPI- otherwise negative. No fever, chills, nausea, vomiting or rash  Physical Examination: Blood pressure 110/72, pulse 71, temperature 98.4 F (36.9 C), temperature source Oral, height 5\' 7"  (1.702 m), weight 261 lb 6.4 oz (118.6 kg), SpO2 98 %. Ideal Body Weight:    GEN: WDWN, NAD, Non-toxic, A & O x 3 HEENT: Atraumatic, Normocephalic. Neck supple. No masses, No LAD. Ears and Nose: No external deformity. CV: RRR, No M/G/R. No JVD. No thrill. No extra heart sounds. PULM: CTA B, no wheezes, crackles, rhonchi. No retractions. No resp. distress. No accessory muscle use. ABD: S, NT, ND, +BS. No rebound. No HSM. EXTR: No c/c/e NEURO Normal gait.  PSYCH: Normally interactive. Conversant. Not depressed or anxious appearing.  Calm demeanor.   Results for orders placed or performed during the hospital encounter of 02/16/16  Glucose, capillary  Result Value Ref Range   Glucose-Capillary 314 (H) 65 - 99 mg/dL   Comment 1 Notify RN     Comment 2 Document in Chart   CBC WITH DIFFERENTIAL  Result Value Ref Range   WBC 6.7 4.0 - 10.5 K/uL   RBC 5.49 (H) 3.87 - 5.11 MIL/uL   Hemoglobin 15.8 (H) 12.0 - 15.0 g/dL   HCT 47.2 (H) 36.0 - 46.0 %   MCV 86.0 78.0 -  100.0 fL   MCH 28.8 26.0 - 34.0 pg   MCHC 33.5 30.0 - 36.0 g/dL   RDW 13.7 11.5 - 15.5 %   Platelets 179 150 - 400 K/uL   Neutrophils Relative % 35 %   Neutro Abs 2.3 1.7 - 7.7 K/uL   Lymphocytes Relative 58 %   Lymphs Abs 3.9 0.7 - 4.0 K/uL   Monocytes Relative 5 %   Monocytes Absolute 0.3 0.1 - 1.0 K/uL   Eosinophils Relative 1 %   Eosinophils Absolute 0.1 0.0 - 0.7 K/uL   Basophils Relative 1 %   Basophils Absolute 0.0 0.0 - 0.1 K/uL  Comprehensive metabolic panel  Result Value Ref Range   Sodium 135 135 - 145 mmol/L   Potassium 4.4 3.5 - 5.1 mmol/L   Chloride 96 (L) 101 - 111 mmol/L   CO2 23 22 - 32 mmol/L   Glucose, Bld 311 (H) 65 - 99 mg/dL   BUN 17 6 - 20 mg/dL   Creatinine, Ser 0.91 0.44 - 1.00 mg/dL   Calcium 10.1 8.9 - 10.3 mg/dL   Total Protein 7.5 6.5 - 8.1 g/dL   Albumin 4.4 3.5 - 5.0 g/dL   AST 27 15 - 41 U/L   ALT 26 14 - 54 U/L   Alkaline Phosphatase 93 38 - 126 U/L   Total Bilirubin 0.8 0.3 - 1.2 mg/dL   GFR calc non Af Amer >60 >60 mL/min   GFR calc Af Amer >60 >60 mL/min   Anion gap 16 (H) 5 - 15  Hemoglobin A1c  Result Value Ref Range   Hgb A1c MFr Bld 12.0 (H) 4.8 - 5.6 %   Mean Plasma Glucose 298 mg/dL   Assessment and Plan: Uncontrolled type 2 diabetes mellitus with diabetic neuropathy, with long-term current use of insulin (HCC)  History of colonic polyps - Plan: Ambulatory referral to Gastroenterology  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  Changed to a different brand and she is now back on therapy  She will  again titrate up based on her am FBG.  Plan to recheck in one week Referral to GI and to neurology as requested   Signed Lamar Blinks, MD

## 2016-02-20 NOTE — Progress Notes (Signed)
Pre visit review using our clinic review tool, if applicable. No additional management support is needed unless otherwise documented below in the visit note. 

## 2016-02-20 NOTE — Patient Instructions (Addendum)
We will have you continue to use your insulin- one shot a day Start with 10 units once a day. As long as your morning blood sugar reading is more than 150, we will increase your insulin dose by 2 units every 2 days 10 units today and tomorrow, then 12 units for 2 days, then 14 units for 2 days, and so on. When you fasting morning sugar is about 150 we will stick with that dose of medication!    Please check your fasting blood sugar every morning, and one other blood sugar check a day.  Write these down and bring to me at our visit next week!

## 2016-02-21 DIAGNOSIS — F330 Major depressive disorder, recurrent, mild: Secondary | ICD-10-CM | POA: Diagnosis not present

## 2016-02-22 ENCOUNTER — Ambulatory Visit (HOSPITAL_COMMUNITY): Admission: RE | Admit: 2016-02-22 | Payer: Medicare Other | Source: Ambulatory Visit | Admitting: Surgery

## 2016-02-22 ENCOUNTER — Encounter (HOSPITAL_COMMUNITY): Admission: RE | Payer: Self-pay | Source: Ambulatory Visit

## 2016-02-22 ENCOUNTER — Ambulatory Visit (HOSPITAL_COMMUNITY): Payer: Medicare Other

## 2016-02-22 SURGERY — BREAST LUMPECTOMY WITH RADIOACTIVE SEED AND SENTINEL LYMPH NODE BIOPSY
Anesthesia: General | Site: Breast | Laterality: Right

## 2016-02-29 ENCOUNTER — Encounter: Payer: Self-pay | Admitting: Family Medicine

## 2016-02-29 ENCOUNTER — Ambulatory Visit (INDEPENDENT_AMBULATORY_CARE_PROVIDER_SITE_OTHER): Payer: Medicare Other | Admitting: Family Medicine

## 2016-02-29 VITALS — BP 110/72 | HR 69 | Temp 98.0°F | Ht 67.0 in

## 2016-02-29 DIAGNOSIS — IMO0002 Reserved for concepts with insufficient information to code with codable children: Secondary | ICD-10-CM

## 2016-02-29 DIAGNOSIS — E1165 Type 2 diabetes mellitus with hyperglycemia: Secondary | ICD-10-CM

## 2016-02-29 DIAGNOSIS — E1140 Type 2 diabetes mellitus with diabetic neuropathy, unspecified: Secondary | ICD-10-CM | POA: Diagnosis not present

## 2016-02-29 DIAGNOSIS — F3170 Bipolar disorder, currently in remission, most recent episode unspecified: Secondary | ICD-10-CM | POA: Diagnosis not present

## 2016-02-29 DIAGNOSIS — G63 Polyneuropathy in diseases classified elsewhere: Secondary | ICD-10-CM

## 2016-02-29 DIAGNOSIS — Z794 Long term (current) use of insulin: Secondary | ICD-10-CM | POA: Diagnosis not present

## 2016-02-29 DIAGNOSIS — G990 Autonomic neuropathy in diseases classified elsewhere: Secondary | ICD-10-CM

## 2016-02-29 DIAGNOSIS — E889 Metabolic disorder, unspecified: Secondary | ICD-10-CM | POA: Diagnosis not present

## 2016-02-29 DIAGNOSIS — M549 Dorsalgia, unspecified: Secondary | ICD-10-CM | POA: Diagnosis not present

## 2016-02-29 DIAGNOSIS — G8929 Other chronic pain: Secondary | ICD-10-CM

## 2016-02-29 LAB — MICROALBUMIN / CREATININE URINE RATIO
CREATININE, U: 136.8 mg/dL
MICROALB UR: 1.5 mg/dL (ref 0.0–1.9)
MICROALB/CREAT RATIO: 1.1 mg/g (ref 0.0–30.0)

## 2016-02-29 NOTE — Patient Instructions (Signed)
Please continue to increase your insulin by 2 units every 2 days until your fasting morning sugar is about 150; however if you get to 30 units and you are still not at goal please let me know! We will check your urine for protein today Please have an eye exam- an optometrist at an eyeglasses shop is fine  Please see me in about one month

## 2016-02-29 NOTE — Progress Notes (Signed)
Pre visit review using our clinic review tool, if applicable. No additional management support is needed unless otherwise documented below in the visit note. 

## 2016-02-29 NOTE — Progress Notes (Signed)
[facility] [address], [place], [place] [address] [phone] [id number] [phone]  Date:  02/29/2016   Name:  Rhonda Berg   DOB:  [birth year]   MRN:  [id number]  PCP:  Lamar Blinks, MD    Chief Complaint: Follow-up (Pt here for diabetes f/u. Pt has started insulin since last visit. Pt has blood sugar log. )   History of Present Illness:  Rhonda Berg is a 75 y.o. very pleasant female patient who presents with the following;  Last here about one week ago on 11/13- started on insulin glargine in September, she ran out and stopped using it for several weeks,  went back on a week ago  Lab Results  Component Value Date   HGBA1C 12.0 (H) 02/16/2016   She brings in a list of her blood sugars today. Fasting this morning was 266- this is the best reading she has had so far She is up to 20 units of lantus She is also on metformin 500 BID, zocor Needs a urine microalbuminuria level She is getting frustrated as her glucose is still running around 300 most of the time; explained to her that we do want to bring her lantus dose up slowly to prevent hypoglycemia and she is making appropriate progress  Overall she is feeling well and tolerating her medication well  History of breast cancer, bipolar disorder  BP Readings from Last 3 Encounters:  02/29/16 110/72  02/20/16 110/72  02/16/16 116/76      Patient Active Problem List   Diagnosis Date Noted  . Breast cancer of lower-outer quadrant of right female breast ([place]) 01/30/2016  . Controlled type 2 diabetes mellitus with complication, without long-term current use of insulin ([place]) 11/24/2015  . Peripheral edema 11/24/2015  . Diabetic polyneuropathy associated with type 2 diabetes mellitus ([place]) 11/24/2015  . Chronic back pain 11/24/2015  . Bipolar affective disorder in remission ([place]) 11/24/2015  . Hyperlipidemia 11/24/2015  . Wheezing 11/24/2015    Past Medical History:   Diagnosis Date  . Arthritis   . Bipolar 1 disorder ([place])   . Cancer ([place])    breast  . Depression   . Diabetes mellitus without complication ([place])   . Diabetic neuropathy ([place])   . Hypercholesteremia   . Hypertension   . Peripheral edema   . Proteinuria   . Sleep apnea    has CPAP but doesnt wear it    Past Surgical History:  Procedure Laterality Date  . ABDOMINAL HYSTERECTOMY    . COLONOSCOPY    . KNEE SURGERY     bilateral partial knee replacements    Social History  Substance Use Topics  . Smoking status: Former Smoker    Packs/day: 0.25    Years: 40.00    Quit date: 04/09/2002  . Smokeless tobacco: Never Used  . Alcohol use No     Comment: social drinker     Family History  Problem Relation Age of Onset  . Cancer Mother     breast cancer   . Cancer Sister     breast cancer   . Cancer Maternal Aunt 63    breast cancer   . Cancer Cousin     colon cancer     No Known Allergies  Medication list has been reviewed and updated.  Current Outpatient Prescriptions on File Prior to Visit  Medication Sig Dispense Refill  . albuterol (PROVENTIL HFA;VENTOLIN HFA) 108 (90 Base) MCG/ACT inhaler  Inhale 2 puffs into the lungs every 6 (six) hours as needed for wheezing or shortness of breath. 1 Inhaler 6  . amantadine (SYMMETREL) 100 MG capsule Take 100 mg by mouth 2 (two) times daily.    . Amantadine HCl 100 MG tablet Take 100 mg by mouth 2 (two) times daily.     [name] aspirin 81 MG tablet Take 81 mg by mouth daily.    . BD PEN NEEDLE NANO U/F 32G X 4 MM MISC Check sugar 2-3 times daily    . Blood Glucose Monitoring Suppl MISC Pt uses one touch verio flex. Please dispense test strips #100 and lancets #100. She tests her sugar 2-3x daily 100 each 3  . clonazePAM (KLONOPIN) 0.5 MG tablet Take 0.5 mg by mouth at bedtime.     . furosemide (LASIX) 40 MG tablet Take 1 tablet (40 mg total) by mouth daily. 30 tablet 6  . gabapentin (NEURONTIN) 600 MG tablet Take 600 mg by mouth 4  (four) times daily.     [name] LANTUS SOLOSTAR 100 UNIT/ML Solostar Pen Start with 10 units subcutaneously once daily.    . metFORMIN (GLUCOPHAGE) 500 MG tablet Take 1 tablet (500 mg total) by mouth 2 (two) times daily with a meal. 60 tablet 6  . mirtazapine (REMERON) 15 MG tablet Take 15 mg by mouth at bedtime.    . Multiple Vitamin (MULTIVITAMIN WITH MINERALS) TABS tablet Take 1 tablet by mouth daily.    . propranolol (INDERAL) 10 MG tablet Take 10 mg by mouth 2 (two) times daily.     . QUEtiapine (SEROQUEL) 200 MG tablet Take 1 tablet by mouth at bedtime.    . simvastatin (ZOCOR) 40 MG tablet Take 1 tablet (40 mg total) by mouth every evening. 30 tablet 6   No current facility-administered medications on file prior to visit.     Review of Systems:  As per HPI- otherwise negative.   Physical Examination: Vitals:   02/29/16 1024  BP: 110/72  Pulse: 69  Temp: 98 F (36.7 C)    GEN: WDWN, NAD, Non-toxic, A & O x 3.  She is a bit worn out as there was a fire alarm at the clinic and she had to go down and then back up the stairs HEENT: Atraumatic, Normocephalic. Neck supple. No masses, No LAD. Ears and Nose: No external deformity. CV: RRR, No M/G/R. No JVD. No thrill. No extra heart sounds. PULM: CTA B, no wheezes, crackles, rhonchi. No retractions. No resp. distress. No accessory muscle use. ABD: S, NT, ND, +BS. No rebound. No HSM. EXTR: No c/c/e NEURO Normal gait.  PSYCH: Normally interactive. Conversant. Not depressed or anxious appearing.  Calm demeanor.    Assessment and Plan: Uncontrolled type 2 diabetes mellitus with diabetic neuropathy, with long-term current use of insulin ([place]) - Plan: Urine Microalbumin w/creat. 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 Plan to recheck in 1 month- See patient instructions for more details.     Signed Lamar Blinks, MD

## 2016-03-05 ENCOUNTER — Telehealth: Payer: Self-pay | Admitting: *Deleted

## 2016-03-05 NOTE — Telephone Encounter (Signed)
Called pt to discuss starting AI before surgery d/t delay in waiting for glucose to get under control. Pt agree and scheduled and confirmed appt with Dr. Burr Medico on 03/08/16 to discuss. Denies further needs or questions at this time.

## 2016-03-08 ENCOUNTER — Encounter: Payer: Self-pay | Admitting: Hematology

## 2016-03-08 ENCOUNTER — Ambulatory Visit (HOSPITAL_BASED_OUTPATIENT_CLINIC_OR_DEPARTMENT_OTHER): Payer: Medicare Other | Admitting: Hematology

## 2016-03-08 VITALS — BP 131/82 | HR 93 | Temp 98.1°F | Resp 17 | Ht 67.0 in | Wt 262.9 lb

## 2016-03-08 DIAGNOSIS — E119 Type 2 diabetes mellitus without complications: Secondary | ICD-10-CM | POA: Diagnosis not present

## 2016-03-08 DIAGNOSIS — Z170 Estrogen receptor positive status [ER+]: Secondary | ICD-10-CM

## 2016-03-08 DIAGNOSIS — C50412 Malignant neoplasm of upper-outer quadrant of left female breast: Secondary | ICD-10-CM

## 2016-03-08 DIAGNOSIS — M1990 Unspecified osteoarthritis, unspecified site: Secondary | ICD-10-CM

## 2016-03-08 DIAGNOSIS — F319 Bipolar disorder, unspecified: Secondary | ICD-10-CM | POA: Diagnosis not present

## 2016-03-08 DIAGNOSIS — E2839 Other primary ovarian failure: Secondary | ICD-10-CM

## 2016-03-08 MED ORDER — EXEMESTANE 25 MG PO TABS: 25.0000 mg | ORAL_TABLET | Freq: Every day | ORAL | 2 refills | Status: DC

## 2016-03-08 NOTE — Progress Notes (Signed)
[facility]  Telephone:([phone]) [phone] Fax:([phone]) [phone]  Clinic Follow Up Note   Patient Care Team: Darreld Mclean, MD as PCP - General (Family Medicine) Erroll Luna, MD as Consulting Physician (General Surgery) Eppie Gibson, MD as Attending Physician (Radiation Oncology) Truitt Merle, MD as Consulting Physician (Hematology) 03/08/2016   CHIEF COMPLAINTS:  Follow up right breast cancer  Oncology History   Breast cancer of lower-outer quadrant of right female breast [facility])   Staging form: Breast, AJCC 7th Edition   - Clinical stage from 01/13/2016: Stage IA (T1c(m), N0, M0) - Signed by Truitt Merle, MD on 01/30/2016      Breast cancer of lower-outer quadrant of right female breast ([place])   01/11/2016 Mammogram    Diagnostic mammogram and ultrasound showed multiple small masses in the lower outer quadrant of the right breast, 2 representative mass at 8:00 position measuring 1.6 and the 0.9 cm. The axillary adenopathy.       01/13/2016 Initial Diagnosis    Breast cancer of lower-outer quadrant of right female breast ([place])      01/13/2016 Initial Biopsy    Right breast 8:00 position two biopsies showed invasive ductal carcinoma, and intermediate grade DCIS.      01/13/2016 Receptors her2    ER 100% positive, PR 100% positive, HER-2 negative, Ki-67 12%       HISTORY OF PRESENTING ILLNESS:  Rhonda Berg 75 y.o. female with past medical history of diabetes, arthritis, bipolar, is here because of her recently diagnosed right breast cancer. She presents to my clinic by herself today.  She has been having routine annual mammogram which has been normal. she felt a right breast lump about one month ago, no significant tenderness, no skin change or nipple discharge. she was seen by her PCP and was sent for diagnostic mammogram and ultrasound, which showed multiple masses in the lower outer quadrant of right breast, the largest one 1.6cm. no axillary adenopathy. She  underwent ultrasound-guided core needle biopsy of the 2 masses of the right breast, both showed invasive ductal carcinoma and intermediate grade DCIS, ER/PR 100% positive, HER-2 negative, Ki-67 12%. She was referred to breast surgeon Dr. Brantley Stage, and was offered lumpectomy and sentinel lymph node biopsy.   She has chronic back pain, uses a crane to get around. She is married, lives with her husband and daughter, her husband has health issue also, so they move to their daughter's house in 06/2015. She is able to do housework, cooking, laundry, etc. she does not exercise regularly, but remains to be moderately physically active.  She has history of bipolar, depression and anxiety, on 3 medications, and her mood disorder seems to be controlled. She also has type 2 diabetes, used to be on insulin, now on metformin alone. She has strong family history of breast cancer.   GYN HISTORY  Menarchal: 10 LMP: 30's (she had hysterectomy for fibroids) Contraceptive: none  HRT: none  G2P1: one miscarriage, one daughter who is 61 yo   CURRENT THERAPY: pending surgery   INTERIM HISTORY:  Ms Larcom returns for follow-up. Her breast surgery was postponed due to her uncontrolled hyperglycemia. She does not have a surgical date yet, likely will be in a few months. She returns to discuss new adjuvant antiestrogen therapy while she is waiting for surgery. She has chronic back pain and some knee pain, walks with a cane. She has chronic mild hot flash. No other new complaints. She is accompanied by her daughter to the clinic  today.   MEDICAL HISTORY:  Past Medical History:  Diagnosis Date  . Arthritis   . Bipolar 1 disorder ([place])   . Cancer ([place])    breast  . Depression   . Diabetes mellitus without complication ([place])   . Diabetic neuropathy ([place])   . Hypercholesteremia   . Hypertension   . Peripheral edema   . Proteinuria   . Sleep apnea    has CPAP but doesnt wear it    SURGICAL HISTORY: Past Surgical  History:  Procedure Laterality Date  . ABDOMINAL HYSTERECTOMY    . COLONOSCOPY    . KNEE SURGERY     bilateral partial knee replacements    SOCIAL HISTORY: Social History   Social History  . Marital status: Married    Spouse name: N/A  . Number of children: N/A  . Years of education: N/A   Occupational History  . Not on file.   Social History Main Topics  . Smoking status: Former Smoker    Packs/day: 0.25    Years: 40.00    Quit date: 04/09/2002  . Smokeless tobacco: Never Used  . Alcohol use No     Comment: social drinker   . Drug use:     Types: Cocaine     Comment: when she was in [age]   . Sexual activity: Not on file   Other Topics Concern  . Not on file   Social History Narrative  . No narrative on file    FAMILY HISTORY: Family History  Problem Relation Age of Onset  . Cancer Mother     breast cancer   . Cancer Sister     breast cancer   . Cancer Maternal Aunt 63    breast cancer   . Cancer Cousin     colon cancer     ALLERGIES:  has No Known Allergies.  MEDICATIONS:  Current Outpatient Prescriptions  Medication Sig Dispense Refill  . albuterol (PROVENTIL HFA;VENTOLIN HFA) 108 (90 Base) MCG/ACT inhaler Inhale 2 puffs into the lungs every 6 (six) hours as needed for wheezing or shortness of breath. 1 Inhaler 6  . amantadine (SYMMETREL) 100 MG capsule Take 100 mg by mouth 2 (two) times daily.    . Amantadine HCl 100 MG tablet Take 100 mg by mouth 2 (two) times daily.     [name] aspirin 81 MG tablet Take 81 mg by mouth daily.    . BD PEN NEEDLE NANO U/F 32G X 4 MM MISC Check sugar 2-3 times daily    . Blood Glucose Monitoring Suppl MISC Pt uses one touch verio flex. Please dispense test strips #100 and lancets #100. She tests her sugar 2-3x daily 100 each 3  . clonazePAM (KLONOPIN) 1 MG tablet Take 1 mg by mouth daily.    . furosemide (LASIX) 40 MG tablet Take 1 tablet (40 mg total) by mouth daily. 30 tablet 6  . gabapentin (NEURONTIN) 600 MG tablet Take  600 mg by mouth 4 (four) times daily.     [name] LANTUS SOLOSTAR 100 UNIT/ML Solostar Pen Start with 10 units subcutaneously once daily. 03/08/16 presently taking 28 units daily    . metFORMIN (GLUCOPHAGE) 500 MG tablet Take 1 tablet (500 mg total) by mouth 2 (two) times daily with a meal. 60 tablet 6  . mirtazapine (REMERON) 15 MG tablet Take 15 mg by mouth at bedtime.    . Multiple Vitamin (MULTIVITAMIN WITH MINERALS) TABS tablet Take 1 tablet by mouth daily.    [name]  propranolol (INDERAL) 10 MG tablet Take 10 mg by mouth 2 (two) times daily.     . QUEtiapine (SEROQUEL) 200 MG tablet Take 1 tablet by mouth at bedtime.    . simvastatin (ZOCOR) 40 MG tablet Take 1 tablet (40 mg total) by mouth every evening. 30 tablet 6   No current facility-administered medications for this visit.     REVIEW OF SYSTEMS:   Constitutional: Denies fevers, chills or abnormal night sweats Eyes: Denies blurriness of vision, double vision or watery eyes Ears, nose, mouth, throat, and face: Denies mucositis or sore throat Respiratory: Denies cough, dyspnea or wheezes Cardiovascular: Denies palpitation, chest discomfort or lower extremity swelling Gastrointestinal:  Denies nausea, heartburn or change in bowel habits Skin: Denies abnormal skin rashes Lymphatics: Denies new lymphadenopathy or easy bruising Neurological:Denies numbness, tingling or new weaknesses Behavioral/Psych: Mood is stable, no new changes  All other systems were reviewed with the patient and are negative.  PHYSICAL EXAMINATION: ECOG PERFORMANCE STATUS: 1 - Symptomatic but completely ambulatory  Vitals:   03/08/16 1349  BP: 131/82  Pulse: 93  Resp: 17  Temp: 98.1 F (36.7 C)   Filed Weights   03/08/16 1349  Weight: 262 lb 14.4 oz (119.3 kg)    GENERAL:alert, no distress and comfortable SKIN: skin color, texture, turgor are normal, no rashes or significant lesions EYES: normal, conjunctiva are pink and non-injected, sclera  clear OROPHARYNX:no exudate, no erythema and lips, buccal mucosa, and tongue normal  NECK: supple, thyroid normal size, non-tender, without nodularity LYMPH:  no palpable lymphadenopathy in the cervical, axillary or inguinal LUNGS: clear to auscultation and percussion with normal breathing effort HEART: regular rate & rhythm and no murmurs and no lower extremity edema ABDOMEN:abdomen soft, non-tender and normal bowel sounds Musculoskeletal:no cyanosis of digits and no clubbing  PSYCH: alert & oriented x 3 with fluent speech NEURO: no focal motor/sensory deficits Breasts: Breast inspection showed them to be symmetrical with no nipple discharge. (+) Skin bruise inter-lower outer quadrant of right breast from biopsy, there are two palpable mass in that area which are close to nipple, measuring 1.5X1 cm and 1cm. Palpation of the left breast and axilla revealed no obvious mass that I could appreciate.   LABORATORY DATA:  I have reviewed the data as listed CBC Latest Ref Rng & Units 02/16/2016 04/13/2012  WBC 4.0 - 10.5 K/uL 6.7 6.4  Hemoglobin 12.0 - 15.0 g/dL 15.8(H) 14.2  Hematocrit 36.0 - 46.0 % 47.2(H) 42.5  Platelets 150 - 400 K/uL 179 207   CMP Latest Ref Rng & Units 02/16/2016 11/24/2015 04/13/2012  Glucose 65 - 99 mg/dL 311(H) 351(H) 235(H)  BUN 6 - 20 mg/dL _0 Creatinine 0.44 - 1.00 mg/dL 0.91 0.98 0.70  Sodium 135 - 145 mmol/L 135 136 138  Potassium 3.5 - 5.1 mmol/L 4.4 4.2 4.2  Chloride 101 - 111 mmol/L 96(L) 97 98  CO2 22 - 32 mmol/L _1 Calcium 8.9 - 10.3 mg/dL 10.1 9.5 9.8  Total Protein 6.5 - 8.1 g/dL 7.5 7.1 -  Total Bilirubin 0.3 - 1.2 mg/dL 0.8 0.3 -  Alkaline Phos 38 - 126 U/L 93 92 -  AST 15 - 41 U/L 27 25 -  ALT 14 - 54 U/L 26 22 -   PATHOLOGY REPORT AL DIAGNOSIS Diagnosis 01/13/2016 1. 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 2. Breast, right,  needle  core biopsy, 8 o'clock, 2cm from nipple - INVASIVE DUCTAL CARCINOMA, SEE COMMENT. - INTERMEDIATE GRADE DUCTAL CARCINOMA IN SITU. Microscopic Comment 1. and 2. While grading is best performed one the resection specimen, the invasive and in situ carcinoma appear grade 2. Prognostic markers will be ordered. Dr. Saralyn Pilar has reviewed the case. The case was called to [facility] on 01/16/2016. Results: IMMUNOHISTOCHEMICAL AND MORPHOMETRIC ANALYSIS PERFORMED MANUALLY Estrogen Receptor: 100%, POSITIVE, STRONG STAINING INTENSITY Progesterone Receptor: 100%, POSITIVE, STRONG STAINING INTENSITY Proliferation Marker Ki67: 12% Results: HER2 - NEGATIVE RATIO OF HER2/CEP17 SIGNALS 1.62 AVERAGE HER2 COPY NUMBER PER CELL 3.65    RADIOGRAPHIC STUDIES: I have personally reviewed the radiological images as listed and agreed with the findings in the report. No results found.  ASSESSMENT & PLAN:  75 year old post menopause Caucasian female, with past medical history of diabetes, arthritis, bipolar, presented with a palpable right breast masses  1. 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 -She was seen by breast surgeon Dr. Brantley Stage, who offered her lumpectomy and sentinel lymph node biopsy.   -Given her early stage breast cancer, negative lymph nodes, low Ki-67, strong ER/PR positive and HER-2 negative disease, I think the risk of cancer recurrence after her computed surgical resection is not very high. -I recommend a Oncotype Dx test on the surgical sample and we'll make a decision about adjuvant chemotherapy based on the Oncotype result. Written material of this test was given to her. She does have multiple medical comorbidities, but has decent functional status, if she does have high-risk  disease, would be a candidate for chemotherapy. -If her surgical sentinel lymph node node positive, I recommend mammaprint for further risk stratification and guide adjuvant chemotherapy. -Giving the strong ER and PR expression in her postmenopausal status, I recommend adjuvant endocrine therapy with aromatase inhibitor for a total of 5-10 years to reduce the risk of cancer recurrence. Potential benefits and side effects were discussed with patient and she is interested. -Her breast surgery is on hold now due to her uncontrolled hyperglycemia. I recommend her to start aromatase inhibitor as a neoadjuvant while she is waiting for surgery -Due to her chronic arthritis, I recommend her to try exemestane first, to prevent cancer progression while she is waiting for surgery. The potential benefit and side effects, which includes but not limited to, hot flash, skin and vaginal dryness, metabolic changes ( increased blood glucose, cholesterol, weight, etc.), slightly in increased risk of cardiovascular disease, cataracts, muscular and joint discomfort, osteopenia and osteoporosis, etc, were discussed with her in great details. She is interested, will start this week, I called in to her pharmacy today -If she has side effects from exemestane, I'll consider switch her to tamoxifen. -I plan to see her back in 4-5 weeks for follow-up -will obtain a bone density scan soon   2. DM -She has uncontrolled hyperglycemia, on insulin and metformin, she will follow up with primary care physician and continue medication  3. Bipolar disorder -Seem to be well controlled, continue medication  4. Arthritis -She has mild chronic back pain and knee pain, tolerable, she uses a cane  Plan -Her breast surgery has been on hold due to her uncontrolled hyperglycemia. She'll start neoadjuvant exemestane this week while she is waiting for surgery. -bone density scan within a month   All questions were answered.  The patient knows  to call the clinic with any problems, questions or concerns. I spent 25 minutes counseling the patient face to face. The total time spent in the appointment was 30 minutes and more than 50% was on counseling.     Truitt Merle, MD 03/08/2016 2:30 PM

## 2016-03-12 ENCOUNTER — Telehealth: Payer: Self-pay | Admitting: Family Medicine

## 2016-03-12 NOTE — Telephone Encounter (Signed)
Caller name: Relationship to patient: Self Can be reached: [phone] Pharmacy:  Reason for call: Patient request call back to discuss Blood Sugar

## 2016-03-12 NOTE — Telephone Encounter (Signed)
Called her back- she is still taking 32 u of lantus and her metformin.  She did not recheck her glucose since she had this high reading this am.  Advised her that one high glucose is probably not cause for alarm- please repeat check today and let me know if she continues to run higher than the 200s

## 2016-03-12 NOTE — Telephone Encounter (Signed)
Pt called to report that this morning her blood sugar was 316. Pt states that for the last 5 days her blood sugar has been in the 200's and coming down. Pt is unsure why it is elevated today since she's hasn't done anything different. Please advise.

## 2016-03-19 ENCOUNTER — Other Ambulatory Visit: Payer: Self-pay | Admitting: Emergency Medicine

## 2016-03-19 ENCOUNTER — Telehealth: Payer: Self-pay | Admitting: General Practice

## 2016-03-19 ENCOUNTER — Telehealth: Payer: Self-pay | Admitting: Family Medicine

## 2016-03-19 MED ORDER — PROPRANOLOL HCL 10 MG PO TABS: 10.0000 mg | ORAL_TABLET | Freq: Two times a day (BID) | ORAL | 3 refills | Status: DC

## 2016-03-19 NOTE — Telephone Encounter (Signed)
Spoke with pt confirmed Jan 5,2018 appts.

## 2016-03-19 NOTE — Telephone Encounter (Signed)
Patient is calling requesting a refill of propranolol (INDERAL) 10 MG tablet Please advise   Pharmacy: [facility], [place]

## 2016-03-21 ENCOUNTER — Ambulatory Visit: Payer: Medicare Other | Admitting: Hematology

## 2016-03-22 ENCOUNTER — Encounter: Payer: Self-pay | Admitting: Family Medicine

## 2016-03-26 ENCOUNTER — Telehealth: Payer: Self-pay | Admitting: Family Medicine

## 2016-03-26 NOTE — Telephone Encounter (Signed)
Patient called stating that Dr. Lorelei Pont called her on Friday and she missed the call. She is calling back to speak with her. Please advise.   Phone: [phone]

## 2016-03-26 NOTE — Telephone Encounter (Signed)
Called her back- I think my nurse just called her to let her know about a refill. She is seeing me this week for a recheck

## 2016-03-28 ENCOUNTER — Ambulatory Visit (INDEPENDENT_AMBULATORY_CARE_PROVIDER_SITE_OTHER): Payer: Commercial Managed Care - HMO | Admitting: Family Medicine

## 2016-03-28 ENCOUNTER — Encounter: Payer: Self-pay | Admitting: Family Medicine

## 2016-03-28 VITALS — BP 116/76 | HR 76 | Temp 98.2°F | Ht 67.0 in | Wt 270.2 lb

## 2016-03-28 DIAGNOSIS — E785 Hyperlipidemia, unspecified: Secondary | ICD-10-CM

## 2016-03-28 DIAGNOSIS — E118 Type 2 diabetes mellitus with unspecified complications: Secondary | ICD-10-CM | POA: Diagnosis not present

## 2016-03-28 DIAGNOSIS — Z1211 Encounter for screening for malignant neoplasm of colon: Secondary | ICD-10-CM

## 2016-03-28 DIAGNOSIS — N63 Unspecified lump in unspecified breast: Secondary | ICD-10-CM | POA: Diagnosis not present

## 2016-03-28 MED ORDER — METFORMIN HCL 1000 MG PO TABS: 1000.0000 mg | ORAL_TABLET | Freq: Two times a day (BID) | ORAL | 3 refills | Status: AC

## 2016-03-28 NOTE — Patient Instructions (Signed)
It was great to see you today -I'll write a note to Dr. Brantley Stage letting him know that your blood sugar is under better control We will check your cholesterol and basic metabolic profile today Hold your insulin at 48 units.  Work on increasing your metformin to 1,000 mg twice daily over a week or so. If your glucose is getting close to or under 100 decrease your insulin   Take care and please see me in 2 months to check your A1c  Merry Christmas!

## 2016-03-28 NOTE — Progress Notes (Signed)
[place] at [facility] [address] [place], [place] [address] [phone]  Date:  03/28/2016   Name:  Rhonda Berg   DOB:  1941-04-09   MRN:  [id number]  PCP:  Lamar Blinks, MD    Chief Complaint: No chief complaint on file.   History of Present Illness:  Rhonda Berg is a 75 y.o. very pleasant female patient who presents with the following:  Seen here a month ago to discuss her DM- started on insulin glargine recently  Lab Results  Component Value Date   HGBA1C 12.0 (H) 02/16/2016   She is now on 48 units of the insulin and her fasting glucose this am was 120.   She is also using metformin 500 BID She is trying to watch her weight.  Eye exam:due for this  Colonoscopy: she is due for this.  Will make referral for her but delay until the spring as she is currently dealing with her breast cancer.    They plan to do a right lumpectomy for her soon- Dr. Brantley Stage. They had planned to do this last month but had to cancel it due to her glucose being so high.  She hopes that she can no go ahead with her operation  Wt Readings from Last 3 Encounters:  03/28/16 270 lb 3.2 oz (122.6 kg)  03/08/16 262 lb 14.4 oz (119.3 kg)  02/20/16 261 lb 6.4 oz (118.6 kg)   BP Readings from Last 3 Encounters:  03/28/16 116/76  03/08/16 131/82  02/29/16 110/72    Patient Active Problem List   Diagnosis Date Noted  . Breast cancer of lower-outer quadrant of right female breast ([place]) 01/30/2016  . Uncontrolled diabetes mellitus type 2 without complications ([place]) [id number]  . Peripheral edema 11/24/2015  . Diabetic polyneuropathy associated with type 2 diabetes mellitus ([place]) 11/24/2015  . Chronic back pain 11/24/2015  . Bipolar affective disorder in remission ([place]) 11/24/2015  . Hyperlipidemia 11/24/2015  . Wheezing 11/24/2015    Past Medical History:  Diagnosis Date  . Arthritis   . Bipolar 1 disorder ([place])   . Cancer ([place])     breast  . Depression   . Diabetes mellitus without complication ([place])   . Diabetic neuropathy ([place])   . Hypercholesteremia   . Hypertension   . Peripheral edema   . Proteinuria   . Sleep apnea    has CPAP but doesnt wear it    Past Surgical History:  Procedure Laterality Date  . ABDOMINAL HYSTERECTOMY    . COLONOSCOPY    . KNEE SURGERY     bilateral partial knee replacements    Social History  Substance Use Topics  . Smoking status: Former Smoker    Packs/day: 0.25    Years: 40.00    Quit date: 04/09/2002  . Smokeless tobacco: Never Used  . Alcohol use No     Comment: social drinker     Family History  Problem Relation Age of Onset  . Cancer Mother     breast cancer   . Cancer Sister     breast cancer   . Cancer Maternal Aunt 63    breast cancer   . Cancer Cousin     colon cancer     No Known Allergies  Medication list has been reviewed and updated.  Current Outpatient Prescriptions on File Prior to Visit  Medication Sig Dispense Refill  . albuterol (PROVENTIL HFA;VENTOLIN HFA) 108 (90 Base)  MCG/ACT inhaler Inhale 2 puffs into the lungs every 6 (six) hours as needed for wheezing or shortness of breath. 1 Inhaler 6  . amantadine (SYMMETREL) 100 MG capsule Take 100 mg by mouth 2 (two) times daily.    . Amantadine HCl 100 MG tablet Take 100 mg by mouth 2 (two) times daily.     [name] aspirin 81 MG tablet Take 81 mg by mouth daily.    . BD PEN NEEDLE NANO U/F 32G X 4 MM MISC Check sugar 2-3 times daily    . Blood Glucose Monitoring Suppl MISC Pt uses one touch verio flex. Please dispense test strips #100 and lancets #100. She tests her sugar 2-3x daily 100 each 3  . clonazePAM (KLONOPIN) 1 MG tablet Take 1 mg by mouth daily.    [name] exemestane (AROMASIN) 25 MG tablet Take 1 tablet (25 mg total) by mouth daily after breakfast. 30 tablet 2  . furosemide (LASIX) 40 MG tablet Take 1 tablet (40 mg total) by mouth daily. 30 tablet 6  . gabapentin (NEURONTIN) 600 MG tablet  Take 600 mg by mouth 4 (four) times daily.     [name] LANTUS SOLOSTAR 100 UNIT/ML Solostar Pen Start with 10 units subcutaneously once daily. 03/08/16 presently taking 28 units daily    . metFORMIN (GLUCOPHAGE) 500 MG tablet Take 1 tablet (500 mg total) by mouth 2 (two) times daily with a meal. 60 tablet 6  . mirtazapine (REMERON) 15 MG tablet Take 15 mg by mouth at bedtime.    . Multiple Vitamin (MULTIVITAMIN WITH MINERALS) TABS tablet Take 1 tablet by mouth daily.    . propranolol (INDERAL) 10 MG tablet Take 1 tablet (10 mg total) by mouth 2 (two) times daily. 60 tablet 3  . QUEtiapine (SEROQUEL) 200 MG tablet Take 1 tablet by mouth at bedtime.    . simvastatin (ZOCOR) 40 MG tablet Take 1 tablet (40 mg total) by mouth every evening. 30 tablet 6   No current facility-administered medications on file prior to visit.     Review of Systems:  As per HPI- otherwise negative.  She is feeling well- no fever or chills, no signs of hypoglycemia No rash, nausea, vomiting, CP or SOB   Physical Examination: Blood pressure 116/76, pulse 76, temperature 98.2 F (36.8 C), temperature source Oral, height 5\' 7"  (1.702 m), weight 270 lb 3.2 oz (122.6 kg), SpO2 100 %.  GEN: WDWN, NAD, Non-toxic, A & O x 3, obese, looks well HEENT: Atraumatic, Normocephalic. Neck supple. No masses, No LAD. Ears and Nose: No external deformity. CV: RRR, No M/G/R. No JVD. No thrill. No extra heart sounds. PULM: CTA B, no wheezes, crackles, rhonchi. No retractions. No resp. distress. No accessory muscle use. EXTR: No c/c/e NEURO Normal gait.  PSYCH: Normally interactive. Conversant. Not depressed or anxious appearing.  Calm demeanor.    Assessment and Plan: Controlled type 2 diabetes mellitus with complication, without long-term current use of insulin ([place]) - Plan: metFORMIN (GLUCOPHAGE) 1000 MG tablet, Basic metabolic panel  Screening for colon cancer - Plan: Ambulatory referral to Gastroenterology  Dyslipidemia - Plan:  Lipid panel  Breast mass  Her glucose has come under control over the last week or so, and her FBG was 120 this am.  She is on 48 units of lantus- she will stay at this dose.  Will titrate up her metformin also  - hope that we will be able to decrease her insulin slightly Follow-up in 2 months Let Dr. Brantley Stage  know that her glucose is improve Declines a flu shot   Signed Lamar Blinks, MD

## 2016-03-29 ENCOUNTER — Telehealth: Payer: Self-pay | Admitting: Family Medicine

## 2016-03-29 ENCOUNTER — Other Ambulatory Visit: Payer: Self-pay | Admitting: Emergency Medicine

## 2016-03-29 MED ORDER — LANTUS SOLOSTAR 100 UNIT/ML ~~LOC~~ SOPN: PEN_INJECTOR | SUBCUTANEOUS | 5 refills | Status: DC

## 2016-03-29 NOTE — Telephone Encounter (Signed)
Caller name: Relationship to patient: Self Can be reached: [phone]  Pharmacy:  [facility], [place] [address] [phone] (Phone) [phone] (Fax     Reason for call: Refill LANTUS SOLOSTAR 100 UNIT/ML Solostar Pen [id number]

## 2016-04-10 ENCOUNTER — Ambulatory Visit: Payer: Self-pay | Admitting: Surgery

## 2016-04-10 DIAGNOSIS — C50911 Malignant neoplasm of unspecified site of right female breast: Secondary | ICD-10-CM

## 2016-04-11 NOTE — Progress Notes (Signed)
[facility]  Telephone:([phone]) [phone] [phone]  Clinic Follow Up Note   Patient Care Team: Darreld Mclean, MD as PCP - General (Family Medicine) Erroll Luna, MD as Consulting Physician (General Surgery) Eppie Gibson, MD as Attending Physician (Radiation Oncology) Truitt Merle, MD as Consulting Physician (Hematology) 04/13/2016   CHIEF COMPLAINTS:  Follow up right breast cancer  Oncology History   Breast cancer of lower-outer quadrant of right female breast [facility])   Staging form: Breast, AJCC 7th Edition   - Clinical stage from 01/13/2016: Stage IA (T1c(m), N0, M0) - Signed by Truitt Merle, MD on 01/30/2016      Breast cancer of lower-outer quadrant of right female breast ([place])   01/11/2016 Mammogram    Diagnostic mammogram and ultrasound showed multiple small masses in the lower outer quadrant of the right breast, 2 representative mass at 8:00 position measuring 1.6 and the 0.9 cm. The axillary adenopathy.       01/13/2016 Initial Diagnosis    Breast cancer of lower-outer quadrant of right female breast ([place])      01/13/2016 Initial Biopsy    Right breast 8:00 position two biopsies showed invasive ductal carcinoma, and intermediate grade DCIS.      01/13/2016 Receptors her2    ER 100% positive, PR 100% positive, HER-2 negative, Ki-67 12%       03/08/2016 -  Neo-Adjuvant Anti-estrogen oral therapy    Exemestane 25 mg daily      HISTORY OF PRESENTING ILLNESS:  Rhonda Berg 76 y.o. female with past medical history of diabetes, arthritis, bipolar, is here because of her recently diagnosed right breast cancer. She presents to my clinic by herself today.  She has been having routine annual mammogram which has been normal. she felt a right breast lump about one month ago, no significant tenderness, no skin change or nipple discharge. she was seen by her PCP and was sent for diagnostic mammogram and ultrasound, which showed multiple masses in the lower  outer quadrant of right breast, the largest one 1.6cm. no axillary adenopathy. 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 She was referred to breast surgeon Dr. Brantley Stage, and was offered lumpectomy and sentinel lymph node biopsy.   She has chronic back pain, uses a crane to get around. She is married, lives with her husband and daughter, her husband has health issue also, so they move to their daughter's house in 06/2015. She is able to do housework, cooking, laundry, etc. she does not exercise regularly, but remains to be moderately physically active.  She has history of bipolar, depression and anxiety, on 3 medications, and her mood disorder seems to be controlled. She also has type 2 diabetes, used to be on insulin, now on metformin alone. She has strong family history of breast cancer.   GYN HISTORY  Menarchal: 10 LMP: 30's (she had hysterectomy for fibroids) Contraceptive: none  HRT: none  G2P1: one miscarriage, one daughter who is 63 yo   CURRENT THERAPY: pending surgery, neoadjuvant Exemestane since 03/08/16.  INTERIM HISTORY:  Rhonda Berg returns for follow-up. Her breast surgery was postponed due to her uncontrolled hyperglycemia. Surgery is scheduled on 04/25/16. The patient's O2 level was 85% today when she arrived, and was 94% on repeated VS. She states it's because of walking and her weight. Reports smoking from age 77 to either 35 or  65. She is on neoadjuvant antiestrogen therapy with Aromasin while she is waiting for surgery. She has chronic back pain and some knee pain, walks with a cane. She has chronic mild hot flashes. No other new complaints. She is accompanied by her daughter to the clinic today.  MEDICAL HISTORY:  Past Medical History:  Diagnosis Date  . Arthritis   . Bipolar 1 disorder ([place])   . Cancer ([place])    breast    . Depression   . Diabetes mellitus without complication ([place])   . Diabetic neuropathy ([place])   . Hypercholesteremia   . Hypertension   . Peripheral edema   . Proteinuria   . Sleep apnea    has CPAP but doesnt wear it    SURGICAL HISTORY: Past Surgical History:  Procedure Laterality Date  . ABDOMINAL HYSTERECTOMY    . COLONOSCOPY    . KNEE SURGERY     bilateral partial knee replacements    SOCIAL HISTORY: Social History   Social History  . Marital status: Married    Spouse name: N/A  . Number of children: N/A  . Years of education: N/A   Occupational History  . Not on file.   Social History Main Topics  . Smoking status: Former Smoker    Packs/day: 0.25    Years: 40.00    Quit date: 04/09/2002  . Smokeless tobacco: Never Used  . Alcohol use No     Comment: social drinker   . Drug use:     Types: Cocaine     Comment: when she was in 32-40   . Sexual activity: Not on file   Other Topics Concern  . Not on file   Social History Narrative  . No narrative on file    FAMILY HISTORY: Family History  Problem Relation Age of Onset  . Cancer Mother     breast cancer   . Cancer Sister     breast cancer   . Cancer Maternal Aunt 63    breast cancer   . Cancer Cousin     colon cancer     ALLERGIES:  has No Known Allergies.  MEDICATIONS:  Current Outpatient Prescriptions  Medication Sig Dispense Refill  . albuterol (PROVENTIL HFA;VENTOLIN HFA) 108 (90 Base) MCG/ACT inhaler Inhale 2 puffs into the lungs every 6 (six) hours as needed for wheezing or shortness of breath. 1 Inhaler 6  . amantadine (SYMMETREL) 100 MG capsule Take 100 mg by mouth 2 (two) times daily.    [name] aspirin 81 MG tablet Take 81 mg by mouth daily.    . BD PEN NEEDLE NANO U/F 32G X 4 MM MISC Check sugar 2-3 times daily    . Blood Glucose Monitoring Suppl MISC Pt uses one touch verio flex. Please dispense test strips #100 and lancets #100. She tests her sugar 2-3x daily 100 each 3  . clonazePAM  (KLONOPIN) 1 MG tablet Take 1 mg by mouth daily.    [name] exemestane (AROMASIN) 25 MG tablet Take 1 tablet (25 mg total) by mouth daily after breakfast. 30 tablet 2  . furosemide (LASIX) 40 MG tablet Take 1 tablet (40 mg total) by mouth daily. 30 tablet 6  . gabapentin (NEURONTIN) 600 MG tablet Take 600 mg by mouth 4 (four) times daily.     [name] LANTUS SOLOSTAR 100 UNIT/ML Solostar Pen Take 48 units subcutaneously once daily.  Adjust as needed 15 mL 5  . metFORMIN (GLUCOPHAGE) 1000 MG tablet Take 1 tablet (1,000 mg  total) by mouth 2 (two) times daily with a meal. 180 tablet 3  . mirtazapine (REMERON) 15 MG tablet Take 15 mg by mouth at bedtime.    . Multiple Vitamin (MULTIVITAMIN WITH MINERALS) TABS tablet Take 1 tablet by mouth daily.    . propranolol (INDERAL) 10 MG tablet Take 1 tablet (10 mg total) by mouth 2 (two) times daily. 60 tablet 3  . QUEtiapine (SEROQUEL) 200 MG tablet Take 1 tablet by mouth at bedtime.    . simvastatin (ZOCOR) 40 MG tablet Take 1 tablet (40 mg total) by mouth every evening. 30 tablet 6  . divalproex (DEPAKOTE ER) 250 MG 24 hr tablet Take 1 tablet by mouth 2 (two) times daily.     No current facility-administered medications for this visit.     REVIEW OF SYSTEMS:   Constitutional: Denies fevers, chills or abnormal night sweats Eyes: Denies blurriness of vision, double vision or watery eyes Ears, nose, mouth, throat, and face: Denies mucositis or sore throat Respiratory: Denies cough (+) dyspnea on exertion Cardiovascular: Denies palpitation, chest discomfort or lower extremity swelling Gastrointestinal:  Denies nausea, heartburn or change in bowel habits Skin: Denies abnormal skin rashes Lymphatics: Denies new lymphadenopathy or easy bruising Neurological:Denies numbness, tingling or new weaknesses Behavioral/Psych: Mood is stable, no new changes  All other systems were reviewed with the patient and are negative.  PHYSICAL EXAMINATION: ECOG PERFORMANCE STATUS: 1 -  Symptomatic but completely ambulatory  Vitals:   04/13/16 1329  Pulse: 92  Resp: 18  Temp: 99 F (37.2 C)  TempSrc: Oral  SpO2: (!) 85%  Weight: 274 lb 9.6 oz (124.6 kg)  Height: 5' 7"  (1.702 m)    GENERAL:alert, no distress and comfortable SKIN: skin color, texture, turgor are normal, no rashes or significant lesions EYES: normal, conjunctiva are pink and non-injected, sclera clear OROPHARYNX:no exudate, no erythema and lips, buccal mucosa, and tongue normal  NECK: supple, thyroid normal size, non-tender, without nodularity LYMPH:  no palpable lymphadenopathy in the cervical, axillary or inguinal LUNGS: clear to auscultation and percussion with normal breathing effort HEART: regular rate & rhythm and no murmurs and no lower extremity edema ABDOMEN:abdomen soft, non-tender and normal bowel sounds Musculoskeletal:no cyanosis of digits and no clubbing  PSYCH: alert & oriented x 3 with fluent speech NEURO: no focal motor/sensory deficits Breasts: Breast inspection showed them to be symmetrical with no nipple discharge. (+) Skin bruise inter-lower outer quadrant of right breast from biopsy, there are two palpable mass in that area which are close to nipple, measuring 1.5X1 cm and 1cm. Palpation of the left breast and axilla revealed no obvious mass that I could appreciate.   LABORATORY DATA:  I have reviewed the data as listed CBC Latest Ref Rng & Units 04/13/2016 02/16/2016 04/13/2012  WBC 3.9 - 10.3 10e3/uL 6.3 6.7 6.4  Hemoglobin 11.6 - 15.9 g/dL 14.2 15.8(H) 14.2  Hematocrit 34.8 - 46.6 % 43.9 47.2(H) 42.5  Platelets 145 - 400 10e3/uL 195 179 207   CMP Latest Ref Rng & Units 04/13/2016 02/16/2016 11/24/2015  Glucose 70 - 140 mg/dl 142(H) 311(H) 351(H)  BUN 7.0 - 26.0 mg/dL 13.6 17 8   Creatinine 0.6 - 1.1 mg/dL 0.9 0.91 0.98  Sodium 136 - 145 mEq/L 144 135 136  Potassium 3.5 - 5.1 mEq/L 4.4 4.4 4.2  Chloride 101 - 111 mmol/L - 96(L) 97  CO2 22 - 29 mEq/L 30(H) 23 31  Calcium 8.4 -  10.4 mg/dL 9.6 10.1 9.5  Total Protein 6.4 -  8.3 g/dL 7.5 7.5 7.1  Total Bilirubin 0.20 - 1.20 mg/dL 0.22 0.8 0.3  Alkaline Phos 40 - 150 U/L 78 93 92  AST 5 - 34 U/L 16 27 25   ALT 0 - 55 U/L 17 26 22    PATHOLOGY REPORT AL DIAGNOSIS Diagnosis 01/13/2016 1. 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 2. 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 Microscopic Comment 1. and 2. While grading is best performed one the resection specimen, the invasive and in situ carcinoma appear grade 2. Prognostic markers will be ordered. Dr. Saralyn Pilar has reviewed the case. The case was called to [facility] on 01/16/2016. Results: IMMUNOHISTOCHEMICAL AND MORPHOMETRIC ANALYSIS PERFORMED MANUALLY Estrogen Receptor: 100%, POSITIVE, STRONG STAINING INTENSITY Progesterone Receptor: 100%, POSITIVE, STRONG STAINING INTENSITY Proliferation Marker Ki67: 12% Results: HER2 - NEGATIVE RATIO OF HER2/CEP17 SIGNALS 1.62 AVERAGE HER2 COPY NUMBER PER CELL 3.65    RADIOGRAPHIC STUDIES: I have personally reviewed the radiological images as listed and agreed with the findings in the report. No results found.  ASSESSMENT & PLAN:  76 y.o. post menopause Caucasian female, with past medical history of diabetes, arthritis, bipolar, presented with a palpable right breast masses  1. 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 -She was seen by breast surgeon Dr. Brantley Stage, who offered her lumpectomy and sentinel lymph node biopsy.   -Given her early stage breast cancer, negative lymph nodes, low Ki-67, strong ER/PR  positive and HER-2 negative disease, I think the risk of cancer recurrence after her computed surgical resection is not very high. -I recommend a Oncotype Dx test on the surgical sample and we'll make a decision about adjuvant chemotherapy based on the Oncotype result. Written material of this test was given to her. She does have multiple medical comorbidities, but has decent functional status, if she does have high-risk disease, would be a candidate for chemotherapy. -If her surgical sentinel lymph node node positive, I recommend mammaprint for further risk stratification and guide adjuvant chemotherapy. -Giving the strong ER and PR expression in her postmenopausal status, I recommend adjuvant endocrine therapy with aromatase inhibitor for a total of 5-10 years to reduce the risk of cancer recurrence. Potential benefits and side effects were discussed with patient and she is interested. -Her breast surgery is on hold now due to her uncontrolled hyperglycemia. I recommend her to start aromatase inhibitor as a neoadjuvant while she is waiting for surgery -Due to her chronic arthritis, I started her on exemestane. She has been tolerating it well -We discussed that she could stop exemestane post-surgery and restart when she completes radiation  -Her breast surgery is scheduled for January 17. I plan to see her back in 3 weeks after surgery for follow-up.   2. DM -Much improved lately, she will continue follow-up with her primary care physician  3. Bipolar disorder -Seem to be well controlled, continue medication  4. Arthritis -She has mild chronic back pain and knee pain, tolerable, she uses a cane  5. Dyspnea -Encouraged her to follow up with her PCP regarding this.  PLAN -Surgery on 04/25/2016 -She will stop exemestane after her breast surgery, and restart after she  completes adjuvant breast radiation -I will review her surgical path and likely obtain Oncotype or mammaprint on her surgical  sample  -F/u the week of 05/16/16.  All questions were answered. The patient knows to call the clinic with any problems, questions or concerns.  I spent 15 minutes counseling the patient face to face. The total time spent in the appointment was 20 minutes and more than 50% was on counseling.     Truitt Merle, MD 04/13/2016 6:19 PM  This document serves as a record of services personally performed by Truitt Merle, MD. It was created on her behalf by Darcus Austin, a trained medical scribe. The creation of this record is based on the scribe's personal observations and the provider's statements to them. This document has been checked and approved by the attending provider.

## 2016-04-12 DIAGNOSIS — F330 Major depressive disorder, recurrent, mild: Secondary | ICD-10-CM | POA: Diagnosis not present

## 2016-04-13 ENCOUNTER — Telehealth: Payer: Self-pay | Admitting: Hematology

## 2016-04-13 ENCOUNTER — Ambulatory Visit (HOSPITAL_BASED_OUTPATIENT_CLINIC_OR_DEPARTMENT_OTHER): Payer: Medicare HMO | Admitting: Hematology

## 2016-04-13 ENCOUNTER — Encounter: Payer: Self-pay | Admitting: Hematology

## 2016-04-13 ENCOUNTER — Other Ambulatory Visit (HOSPITAL_BASED_OUTPATIENT_CLINIC_OR_DEPARTMENT_OTHER): Payer: Medicare HMO

## 2016-04-13 VITALS — HR 92 | Temp 99.0°F | Resp 18 | Ht 67.0 in | Wt 274.6 lb

## 2016-04-13 DIAGNOSIS — Z170 Estrogen receptor positive status [ER+]: Principal | ICD-10-CM

## 2016-04-13 DIAGNOSIS — R0600 Dyspnea, unspecified: Secondary | ICD-10-CM | POA: Diagnosis not present

## 2016-04-13 DIAGNOSIS — C50511 Malignant neoplasm of lower-outer quadrant of right female breast: Secondary | ICD-10-CM

## 2016-04-13 DIAGNOSIS — E119 Type 2 diabetes mellitus without complications: Secondary | ICD-10-CM

## 2016-04-13 DIAGNOSIS — F319 Bipolar disorder, unspecified: Secondary | ICD-10-CM | POA: Diagnosis not present

## 2016-04-13 DIAGNOSIS — M1990 Unspecified osteoarthritis, unspecified site: Secondary | ICD-10-CM

## 2016-04-13 LAB — CBC WITH DIFFERENTIAL/PLATELET
BASO%: 0.9 % (ref 0.0–2.0)
BASOS ABS: 0.1 10*3/uL (ref 0.0–0.1)
EOS%: 1.3 % (ref 0.0–7.0)
Eosinophils Absolute: 0.1 10*3/uL (ref 0.0–0.5)
HCT: 43.9 % (ref 34.8–46.6)
HGB: 14.2 g/dL (ref 11.6–15.9)
LYMPH%: 51.5 % — AB (ref 14.0–49.7)
MCH: 28.5 pg (ref 25.1–34.0)
MCHC: 32.3 g/dL (ref 31.5–36.0)
MCV: 88.3 fL (ref 79.5–101.0)
MONO#: 0.6 10*3/uL (ref 0.1–0.9)
MONO%: 8.9 % (ref 0.0–14.0)
NEUT#: 2.3 10*3/uL (ref 1.5–6.5)
NEUT%: 37.4 % — AB (ref 38.4–76.8)
Platelets: 195 10*3/uL (ref 145–400)
RBC: 4.97 10*6/uL (ref 3.70–5.45)
RDW: 14.8 % — ABNORMAL HIGH (ref 11.2–14.5)
WBC: 6.3 10*3/uL (ref 3.9–10.3)
lymph#: 3.2 10*3/uL (ref 0.9–3.3)

## 2016-04-13 LAB — COMPREHENSIVE METABOLIC PANEL
ALT: 17 U/L (ref 0–55)
AST: 16 U/L (ref 5–34)
Albumin: 4.1 g/dL (ref 3.5–5.0)
Alkaline Phosphatase: 78 U/L (ref 40–150)
Anion Gap: 10 mEq/L (ref 3–11)
BUN: 13.6 mg/dL (ref 7.0–26.0)
CHLORIDE: 104 meq/L (ref 98–109)
CO2: 30 mEq/L — ABNORMAL HIGH (ref 22–29)
CREATININE: 0.9 mg/dL (ref 0.6–1.1)
Calcium: 9.6 mg/dL (ref 8.4–10.4)
EGFR: 75 mL/min/{1.73_m2} — ABNORMAL LOW (ref >90)
Glucose: 142 mg/dl — ABNORMAL HIGH (ref 70–140)
POTASSIUM: 4.4 meq/L (ref 3.5–5.1)
SODIUM: 144 meq/L (ref 136–145)
Total Bilirubin: 0.22 mg/dL (ref 0.20–1.20)
Total Protein: 7.5 g/dL (ref 6.4–8.3)

## 2016-04-13 NOTE — Telephone Encounter (Signed)
Appointments scheduled per 04/13/16 los. Patient was given a copy of the AVS report and appointment schedule, per 04/13/16 los. °

## 2016-04-17 NOTE — Pre-Procedure Instructions (Addendum)
Rhonda Berg  04/17/2016      [id number], [place] [name] Phone: [phone] Fax: [phone]    Your procedure is scheduled on Wed. Jan.17  Report to [facility] Admitting at 6:30A.M.  Call this number if you have problems the morning of surgery:  [phone]   Remember:  Do not eat food or drink liquids after midnight on Tues. Jan. 16  Except : drink 8 oz. Bottle of water 4:30 AM   except   Take these medicines the morning of surgery with A SIP OF WATER : albuterol inhaler if needed-bring to hospital, amantadine (symmetrel), clonazepam (klonopin), divalproex (depakote ER), gabapentin (neurontin), propranolol (inderal)              1 week prior to surgery stop: aspirin, advil, motrin, aleve, ibuprofen, BC Powders, Goody's, vitamins/ herbal medicines.    How to Manage Your Diabetes Before and After Surgery  Why is it important to control my blood sugar before and after surgery? . Improving blood sugar levels before and after surgery helps healing and can limit problems. . A way of improving blood sugar control is eating a healthy diet by: o  Eating less sugar and carbohydrates o  Increasing activity/exercise o  Talking with your doctor about reaching your blood sugar goals . High blood sugars (greater than 180 mg/dL) can raise your risk of infections and slow your recovery, so you will need to focus on controlling your diabetes during the weeks before surgery. . Make sure that the doctor who takes care of your diabetes knows about your planned surgery including the date and location.  How do I manage my blood sugar before surgery? . Check your blood sugar at least 4 times a day, starting 2 days before surgery, to make sure that the level is not too high or low. o Check your blood sugar the morning of your surgery when you wake up and every 2 hours until you get to the  Short Stay unit. . If your blood sugar is less than 70 mg/dL, you will need to treat for low blood sugar: o Do not take insulin. o Treat a low blood sugar (less than 70 mg/dL) with  cup of clear juice (cranberry or apple), 4 glucose tablets, OR glucose gel. o Recheck blood sugar in 15 minutes after treatment (to make sure it is greater than 70 mg/dL). If your blood sugar is not greater than 70 mg/dL on recheck, call [phone] for further instructions. . Report your blood sugar to the short stay nurse when you get to Short Stay.  . If you are admitted to the hospital after surgery: o Your blood sugar will be checked by the staff and you will probably be given insulin after surgery (instead of oral diabetes medicines) to make sure you have good blood sugar levels. o The goal for blood sugar control after surgery is 80-180 mg/dL.        WHAT DO I DO ABOUT MY DIABETES MEDICATION?   [name] Do not take oral diabetes medicines (pills) the morning of surgery.      [name] HE MORNING OF SURGERY, take __20  units of _lantus__insulin.     Do not wear jewelry, make-up or nail polish.  Do not wear lotions, powders, or perfumes, or deoderant.  Do not shave 48 hours prior to surgery.  Men may shave face and neck.  Do not  bring valuables to the hospital.  [facility] is not responsible for any belongings or valuables.  Contacts, dentures or bridgework may not be worn into surgery.  Leave your suitcase in the car.  After surgery it may be brought to your room.  For patients admitted to the hospital, discharge time will be determined by your treatment team.  Patients discharged the day of surgery will not be allowed to drive home.   Name and phone number of your driver: Special instructions:  Review preparing for surgery  Please read over the following fact sheets that you were given. Coughing and Deep Breathing

## 2016-04-18 ENCOUNTER — Encounter (HOSPITAL_COMMUNITY): Payer: Self-pay

## 2016-04-18 ENCOUNTER — Encounter (HOSPITAL_COMMUNITY)
Admission: RE | Admit: 2016-04-18 | Discharge: 2016-04-18 | Disposition: A | Discharge status: Home / Self Care | Payer: Medicare HMO | Source: Ambulatory Visit | Attending: Surgery | Admitting: Surgery

## 2016-04-18 DIAGNOSIS — Z01812 Encounter for preprocedural laboratory examination: Secondary | ICD-10-CM | POA: Diagnosis not present

## 2016-04-18 DIAGNOSIS — C50911 Malignant neoplasm of unspecified site of right female breast: Secondary | ICD-10-CM | POA: Diagnosis not present

## 2016-04-18 HISTORY — DX: Dyspnea, unspecified: R06.00

## 2016-04-18 LAB — CBC
HEMATOCRIT: 45.1 % (ref 36.0–46.0)
Hemoglobin: 14.4 g/dL (ref 12.0–15.0)
MCH: 28.4 pg (ref 26.0–34.0)
MCHC: 31.9 g/dL (ref 30.0–36.0)
MCV: 89 fL (ref 78.0–100.0)
PLATELETS: 185 10*3/uL (ref 150–400)
RBC: 5.07 MIL/uL (ref 3.87–5.11)
RDW: 15 % (ref 11.5–15.5)
WBC: 5.8 10*3/uL (ref 4.0–10.5)

## 2016-04-18 LAB — BASIC METABOLIC PANEL
ANION GAP: 11 (ref 5–15)
BUN: 14 mg/dL (ref 6–20)
CALCIUM: 9.3 mg/dL (ref 8.9–10.3)
CO2: 28 mmol/L (ref 22–32)
Chloride: 103 mmol/L (ref 101–111)
Creatinine, Ser: 0.88 mg/dL (ref 0.44–1.00)
Glucose, Bld: 87 mg/dL (ref 65–99)
POTASSIUM: 4.1 mmol/L (ref 3.5–5.1)
Sodium: 142 mmol/L (ref 135–145)

## 2016-04-18 LAB — GLUCOSE, CAPILLARY: GLUCOSE-CAPILLARY: 90 mg/dL (ref 65–99)

## 2016-04-18 MED ORDER — CHLORHEXIDINE GLUCONATE CLOTH 2 % EX PADS: 6.0000 | MEDICATED_PAD | Freq: Once | CUTANEOUS | Status: DC

## 2016-04-19 LAB — HEMOGLOBIN A1C
HEMOGLOBIN A1C: 10.5 % — AB (ref 4.8–5.6)
Mean Plasma Glucose: 255 mg/dL

## 2016-04-19 NOTE — Progress Notes (Signed)
Anesthesia Chart Review: Patient is a 76 year old female scheduled for right breast seed lumpectomy with radioactive seed and right sentinel LN mapping on 04/25/16 by Dr. Brantley Stage. Surgery was initially scheduled for 02/22/16, but was postponed due to poorly controlled diabetes. Since then her A1c has gone from 12.0 to 10.5. Insulin glargine was re-added to her DM regimen. At her last visit with Dr. Darla Lesches on 03/28/16, her fasting CBG was 120. She let Dr. Brantley Stage know that patient's glucose had improved and case was rescheduled. (It looks like she is scheduled for radioactive seed implant on 04/24/16.)   History includes former smoker, DM2 with neuropathy, right breast cancer, HTN, hypercholesterolemia, peripheral edema, OSA (CPAP non-compliance), Bipolar 1 disorder, hysterectomy. BMI is consistent with morbid obesity.   PCP is Dr. Janett Billow Copland. HEM-ONC is Dr. Burr Medico. RAD-ONC is Dr. Isidore Moos.  Sleep Study requested from Dr. Orland Penman in [place], [place], but no sleep study report received.   Meds include albuterol, amantadine, ASA 81mg , clonazepam, Depakote, Aromasin, Lasix, gabapentin, insulin glargine, metformin, Remeron, Inderal, Seroquel, Zocor.   BP 118/82   Pulse 92   Temp 36.9 C   Resp 18   Ht 5\' 7"  (1.702 m)   Wt 272 lb (123.4 kg)   SpO2 95%   BMI 42.60 kg/m   02/16/16 EKG: NSR.  Preoperative labs noted. Cr 0.88. CBC WNL. Glucose 87. A1c down to 10.5, consistent with average glucose 255 (preivously 12.0). Elevated A1c results called to [facility] triage RN Abigail Butts to ensure Dr. Brantley Stage is aware. Patient is on insulin now with better fasting CBG, so hopefully fasting glucose on the day of surgery will be < 200. If she presented with glucose > 250, then procedure could get delayed in order to get additional insulin. (I had previously discussed case with Dr. Linna Caprice. See my 02/17/16 note.)    George Hugh [facility] Short Stay Center/Anesthesiology Phone [phone] 04/19/2016 4:41  PM

## 2016-04-23 ENCOUNTER — Other Ambulatory Visit: Payer: Self-pay | Admitting: Emergency Medicine

## 2016-04-24 ENCOUNTER — Ambulatory Visit
Admission: RE | Admit: 2016-04-24 | Discharge: 2016-04-24 | Disposition: A | Discharge status: Home / Self Care | Payer: Medicare HMO | Source: Ambulatory Visit | Attending: Surgery | Admitting: Surgery

## 2016-04-24 DIAGNOSIS — R928 Other abnormal and inconclusive findings on diagnostic imaging of breast: Secondary | ICD-10-CM | POA: Diagnosis not present

## 2016-04-24 DIAGNOSIS — C50911 Malignant neoplasm of unspecified site of right female breast: Secondary | ICD-10-CM

## 2016-04-24 MED ORDER — CEFAZOLIN SODIUM 10 G IJ SOLR
3.0000 g | INTRAMUSCULAR | Status: AC
  Administered 2016-04-25: 3 g via INTRAVENOUS
  Filled 2016-04-24: qty 3000

## 2016-04-25 ENCOUNTER — Ambulatory Visit (HOSPITAL_COMMUNITY): Payer: Medicare HMO | Admitting: Emergency Medicine

## 2016-04-25 ENCOUNTER — Ambulatory Visit
Admission: RE | Admit: 2016-04-25 | Discharge: 2016-04-25 | Disposition: A | Discharge status: Home / Self Care | Payer: Medicare HMO | Source: Ambulatory Visit

## 2016-04-25 ENCOUNTER — Ambulatory Visit
Admission: RE | Admit: 2016-04-25 | Discharge: 2016-04-25 | Disposition: A | Discharge status: Home / Self Care | Payer: Medicare HMO | Source: Ambulatory Visit | Attending: Surgery | Admitting: Surgery

## 2016-04-25 ENCOUNTER — Encounter (HOSPITAL_COMMUNITY): Payer: Self-pay | Admitting: *Deleted

## 2016-04-25 ENCOUNTER — Encounter (HOSPITAL_COMMUNITY)
Admission: RE | Admit: 2016-04-25 | Discharge: 2016-04-25 | Disposition: A | Discharge status: Home / Self Care | Payer: Medicare HMO | Source: Ambulatory Visit | Attending: Surgery | Admitting: Surgery

## 2016-04-25 ENCOUNTER — Ambulatory Visit (HOSPITAL_COMMUNITY): Payer: Medicare HMO | Admitting: Anesthesiology

## 2016-04-25 ENCOUNTER — Encounter (HOSPITAL_COMMUNITY)
Admission: RE | Disposition: A | Discharge status: Home / Self Care | Payer: Self-pay | Source: Ambulatory Visit | Attending: Surgery

## 2016-04-25 ENCOUNTER — Ambulatory Visit (HOSPITAL_COMMUNITY)
Admission: RE | Admit: 2016-04-25 | Discharge: 2016-04-25 | Disposition: A | Discharge status: Home / Self Care | Payer: Medicare HMO | Source: Ambulatory Visit | Attending: Surgery | Admitting: Surgery

## 2016-04-25 DIAGNOSIS — Z6841 Body Mass Index (BMI) 40.0 and over, adult: Secondary | ICD-10-CM | POA: Diagnosis not present

## 2016-04-25 DIAGNOSIS — Z87891 Personal history of nicotine dependence: Secondary | ICD-10-CM | POA: Insufficient documentation

## 2016-04-25 DIAGNOSIS — G8929 Other chronic pain: Secondary | ICD-10-CM | POA: Insufficient documentation

## 2016-04-25 DIAGNOSIS — C50911 Malignant neoplasm of unspecified site of right female breast: Secondary | ICD-10-CM

## 2016-04-25 DIAGNOSIS — Z811 Family history of alcohol abuse and dependence: Secondary | ICD-10-CM | POA: Insufficient documentation

## 2016-04-25 DIAGNOSIS — F419 Anxiety disorder, unspecified: Secondary | ICD-10-CM | POA: Diagnosis not present

## 2016-04-25 DIAGNOSIS — E119 Type 2 diabetes mellitus without complications: Secondary | ICD-10-CM | POA: Diagnosis not present

## 2016-04-25 DIAGNOSIS — Z7984 Long term (current) use of oral hypoglycemic drugs: Secondary | ICD-10-CM | POA: Diagnosis not present

## 2016-04-25 DIAGNOSIS — Z79899 Other long term (current) drug therapy: Secondary | ICD-10-CM | POA: Diagnosis not present

## 2016-04-25 DIAGNOSIS — Z86010 Personal history of colonic polyps: Secondary | ICD-10-CM | POA: Insufficient documentation

## 2016-04-25 DIAGNOSIS — Z833 Family history of diabetes mellitus: Secondary | ICD-10-CM | POA: Diagnosis not present

## 2016-04-25 DIAGNOSIS — Z90710 Acquired absence of both cervix and uterus: Secondary | ICD-10-CM | POA: Diagnosis not present

## 2016-04-25 DIAGNOSIS — M549 Dorsalgia, unspecified: Secondary | ICD-10-CM | POA: Insufficient documentation

## 2016-04-25 DIAGNOSIS — D0511 Intraductal carcinoma in situ of right breast: Secondary | ICD-10-CM | POA: Insufficient documentation

## 2016-04-25 DIAGNOSIS — M1990 Unspecified osteoarthritis, unspecified site: Secondary | ICD-10-CM | POA: Diagnosis not present

## 2016-04-25 DIAGNOSIS — C50511 Malignant neoplasm of lower-outer quadrant of right female breast: Secondary | ICD-10-CM | POA: Diagnosis not present

## 2016-04-25 DIAGNOSIS — Z170 Estrogen receptor positive status [ER+]: Secondary | ICD-10-CM | POA: Diagnosis not present

## 2016-04-25 DIAGNOSIS — E7800 Pure hypercholesterolemia, unspecified: Secondary | ICD-10-CM | POA: Diagnosis not present

## 2016-04-25 DIAGNOSIS — Z7982 Long term (current) use of aspirin: Secondary | ICD-10-CM | POA: Insufficient documentation

## 2016-04-25 DIAGNOSIS — Z803 Family history of malignant neoplasm of breast: Secondary | ICD-10-CM | POA: Diagnosis not present

## 2016-04-25 DIAGNOSIS — I10 Essential (primary) hypertension: Secondary | ICD-10-CM | POA: Diagnosis not present

## 2016-04-25 DIAGNOSIS — G8918 Other acute postprocedural pain: Secondary | ICD-10-CM | POA: Diagnosis not present

## 2016-04-25 DIAGNOSIS — F329 Major depressive disorder, single episode, unspecified: Secondary | ICD-10-CM | POA: Insufficient documentation

## 2016-04-25 DIAGNOSIS — G4730 Sleep apnea, unspecified: Secondary | ICD-10-CM | POA: Insufficient documentation

## 2016-04-25 HISTORY — PX: BREAST LUMPECTOMY WITH RADIOACTIVE SEED AND SENTINEL LYMPH NODE BIOPSY: SHX6550

## 2016-04-25 LAB — GLUCOSE, CAPILLARY
GLUCOSE-CAPILLARY: 101 mg/dL — AB (ref 65–99)
GLUCOSE-CAPILLARY: 100 mg/dL — AB (ref 65–99)

## 2016-04-25 SURGERY — BREAST LUMPECTOMY WITH RADIOACTIVE SEED AND SENTINEL LYMPH NODE BIOPSY
Anesthesia: General | Site: Breast | Laterality: Right

## 2016-04-25 MED ORDER — LIDOCAINE HCL (CARDIAC) 20 MG/ML IV SOLN
INTRAVENOUS | Status: DC | PRN
  Administered 2016-04-25: 80 mg via INTRATRACHEAL

## 2016-04-25 MED ORDER — METHYLENE BLUE 0.5 % INJ SOLN
INTRAVENOUS | Status: AC
  Filled 2016-04-25: qty 10

## 2016-04-25 MED ORDER — PROMETHAZINE HCL 25 MG/ML IJ SOLN: 6.2500 mg | INTRAMUSCULAR | Status: DC | PRN

## 2016-04-25 MED ORDER — SODIUM CHLORIDE 0.9 % IJ SOLN
INTRAMUSCULAR | Status: DC | PRN
  Administered 2016-04-25: 5 mL via INTRAMUSCULAR

## 2016-04-25 MED ORDER — PHENYLEPHRINE HCL 10 MG/ML IJ SOLN
INTRAMUSCULAR | Status: DC | PRN
  Administered 2016-04-25: 50 ug/min via INTRAVENOUS

## 2016-04-25 MED ORDER — BUPIVACAINE HCL (PF) 0.25 % IJ SOLN
INTRAMUSCULAR | Status: DC | PRN
  Administered 2016-04-25: 10 mL

## 2016-04-25 MED ORDER — ALBUTEROL SULFATE (2.5 MG/3ML) 0.083% IN NEBU
2.5000 mg | INHALATION_SOLUTION | Freq: Four times a day (QID) | RESPIRATORY_TRACT | Status: DC | PRN
  Administered 2016-04-25: 2.5 mg via RESPIRATORY_TRACT

## 2016-04-25 MED ORDER — HYDROMORPHONE HCL 1 MG/ML IJ SOLN
0.2500 mg | INTRAMUSCULAR | Status: DC | PRN
Start: 2016-04-25 — End: 2016-04-25
  Administered 2016-04-25 (×2): 0.5 mg via INTRAVENOUS

## 2016-04-25 MED ORDER — ACETAMINOPHEN 500 MG PO TABS
1000.0000 mg | ORAL_TABLET | ORAL | Status: AC
  Administered 2016-04-25: 1000 mg via ORAL
  Filled 2016-04-25: qty 2

## 2016-04-25 MED ORDER — GABAPENTIN 300 MG PO CAPS
300.0000 mg | ORAL_CAPSULE | ORAL | Status: DC
  Filled 2016-04-25: qty 1

## 2016-04-25 MED ORDER — PROPOFOL 10 MG/ML IV BOLUS
INTRAVENOUS | Status: AC
  Filled 2016-04-25: qty 20

## 2016-04-25 MED ORDER — HEMOSTATIC AGENTS (NO CHARGE) OPTIME
TOPICAL | Status: DC | PRN
  Administered 2016-04-25: 1 via TOPICAL

## 2016-04-25 MED ORDER — TECHNETIUM TC 99M SULFUR COLLOID FILTERED
1.0000 | Freq: Once | INTRAVENOUS | Status: AC | PRN
  Administered 2016-04-25: 1 via INTRADERMAL

## 2016-04-25 MED ORDER — ALBUTEROL SULFATE (2.5 MG/3ML) 0.083% IN NEBU
INHALATION_SOLUTION | RESPIRATORY_TRACT | Status: AC
  Filled 2016-04-25: qty 3

## 2016-04-25 MED ORDER — HYDROCODONE-ACETAMINOPHEN 5-325 MG PO TABS: 1.0000 | ORAL_TABLET | Freq: Four times a day (QID) | ORAL | 0 refills | Status: DC | PRN

## 2016-04-25 MED ORDER — ROCURONIUM BROMIDE 100 MG/10ML IV SOLN
INTRAVENOUS | Status: DC | PRN
  Administered 2016-04-25: 50 mg via INTRAVENOUS

## 2016-04-25 MED ORDER — BUPIVACAINE HCL (PF) 0.5 % IJ SOLN
INTRAMUSCULAR | Status: DC | PRN
  Administered 2016-04-25: 30 mL

## 2016-04-25 MED ORDER — ONDANSETRON HCL 4 MG/2ML IJ SOLN
INTRAMUSCULAR | Status: DC | PRN
  Administered 2016-04-25: 4 mg via INTRAVENOUS

## 2016-04-25 MED ORDER — OXYCODONE HCL 5 MG PO TABS
ORAL_TABLET | ORAL | Status: AC
  Filled 2016-04-25: qty 1

## 2016-04-25 MED ORDER — FENTANYL CITRATE (PF) 100 MCG/2ML IJ SOLN
INTRAMUSCULAR | Status: AC
  Filled 2016-04-25: qty 4

## 2016-04-25 MED ORDER — SODIUM CHLORIDE 0.9 % IJ SOLN
INTRAMUSCULAR | Status: AC
  Filled 2016-04-25: qty 10

## 2016-04-25 MED ORDER — 0.9 % SODIUM CHLORIDE (POUR BTL) OPTIME
TOPICAL | Status: DC | PRN
Start: 2016-04-25 — End: 2016-04-25
  Administered 2016-04-25: 1000 mL

## 2016-04-25 MED ORDER — LACTATED RINGERS IV SOLN
INTRAVENOUS | Status: DC | PRN
  Administered 2016-04-25 (×2): via INTRAVENOUS

## 2016-04-25 MED ORDER — INSULIN ASPART 100 UNIT/ML ~~LOC~~ SOLN: 0.0000 [IU] | Freq: Three times a day (TID) | SUBCUTANEOUS | Status: DC

## 2016-04-25 MED ORDER — HYDROMORPHONE HCL 2 MG/ML IJ SOLN
INTRAMUSCULAR | Status: AC
  Filled 2016-04-25: qty 1

## 2016-04-25 MED ORDER — FENTANYL CITRATE (PF) 100 MCG/2ML IJ SOLN
INTRAMUSCULAR | Status: DC | PRN
  Administered 2016-04-25: 50 ug via INTRAVENOUS

## 2016-04-25 MED ORDER — SUGAMMADEX SODIUM 200 MG/2ML IV SOLN
INTRAVENOUS | Status: DC | PRN
  Administered 2016-04-25: 200 mg via INTRAVENOUS

## 2016-04-25 MED ORDER — PROPOFOL 10 MG/ML IV BOLUS
INTRAVENOUS | Status: DC | PRN
  Administered 2016-04-25: 150 mg via INTRAVENOUS

## 2016-04-25 MED ORDER — BUPIVACAINE HCL (PF) 0.25 % IJ SOLN
INTRAMUSCULAR | Status: AC
  Filled 2016-04-25: qty 30

## 2016-04-25 MED ORDER — SUCCINYLCHOLINE CHLORIDE 20 MG/ML IJ SOLN
INTRAMUSCULAR | Status: DC | PRN
  Administered 2016-04-25: 120 mg via INTRAVENOUS

## 2016-04-25 MED ORDER — CELECOXIB 200 MG PO CAPS
400.0000 mg | ORAL_CAPSULE | ORAL | Status: AC
  Administered 2016-04-25: 400 mg via ORAL
  Filled 2016-04-25: qty 2

## 2016-04-25 MED ORDER — OXYCODONE HCL 5 MG PO TABS
5.0000 mg | ORAL_TABLET | ORAL | Status: DC | PRN
  Administered 2016-04-25: 5 mg via ORAL

## 2016-04-25 MED ORDER — EPHEDRINE SULFATE 50 MG/ML IJ SOLN
INTRAMUSCULAR | Status: DC | PRN
  Administered 2016-04-25: 5 mg via INTRAVENOUS

## 2016-04-25 MED ORDER — PHENYLEPHRINE HCL 10 MG/ML IJ SOLN
INTRAMUSCULAR | Status: DC | PRN
  Administered 2016-04-25 (×2): 120 ug via INTRAVENOUS

## 2016-04-25 SURGICAL SUPPLY — 49 items
APPLIER CLIP 9.375 MED OPEN (MISCELLANEOUS) ×3
BINDER BREAST XXLRG (GAUZE/BANDAGES/DRESSINGS) ×3 IMPLANT
BLADE SURG 15 STRL LF DISP TIS (BLADE) ×1 IMPLANT
BLADE SURG 15 STRL SS (BLADE) ×2
CANISTER SUCTION 2500CC (MISCELLANEOUS) ×3 IMPLANT
CHLORAPREP W/TINT 26ML (MISCELLANEOUS) ×3 IMPLANT
CLIP APPLIE 9.375 MED OPEN (MISCELLANEOUS) ×1 IMPLANT
CONT SPEC 4OZ CLIKSEAL STRL BL (MISCELLANEOUS) ×3 IMPLANT
COVER PROBE W GEL 5X96 (DRAPES) ×3 IMPLANT
COVER SURGICAL LIGHT HANDLE (MISCELLANEOUS) ×3 IMPLANT
DERMABOND ADVANCED (GAUZE/BANDAGES/DRESSINGS) ×2
DERMABOND ADVANCED .7 DNX12 (GAUZE/BANDAGES/DRESSINGS) ×1 IMPLANT
DEVICE DUBIN SPECIMEN MAMMOGRA (MISCELLANEOUS) ×3 IMPLANT
DRAPE CHEST BREAST 15X10 FENES (DRAPES) ×3 IMPLANT
DRAPE UTILITY XL STRL (DRAPES) ×3 IMPLANT
ELECT CAUTERY BLADE 6.4 (BLADE) ×3 IMPLANT
ELECT REM PT RETURN 9FT ADLT (ELECTROSURGICAL) ×3
ELECTRODE REM PT RTRN 9FT ADLT (ELECTROSURGICAL) ×1 IMPLANT
GLOVE BIO SURGEON STRL SZ8 (GLOVE) ×3 IMPLANT
GLOVE BIOGEL PI IND STRL 7.0 (GLOVE) ×1 IMPLANT
GLOVE BIOGEL PI IND STRL 8 (GLOVE) ×1 IMPLANT
GLOVE BIOGEL PI INDICATOR 7.0 (GLOVE) ×2
GLOVE BIOGEL PI INDICATOR 8 (GLOVE) ×2
GLOVE SURG SS PI 6.5 STRL IVOR (GLOVE) ×3 IMPLANT
GLOVE SURG SS PI 7.0 STRL IVOR (GLOVE) ×3 IMPLANT
GOWN STRL REUS W/ TWL LRG LVL3 (GOWN DISPOSABLE) ×1 IMPLANT
GOWN STRL REUS W/ TWL XL LVL3 (GOWN DISPOSABLE) ×1 IMPLANT
GOWN STRL REUS W/TWL LRG LVL3 (GOWN DISPOSABLE) ×2
GOWN STRL REUS W/TWL XL LVL3 (GOWN DISPOSABLE) ×2
HEMOSTAT SNOW SURGICEL 2X4 (HEMOSTASIS) ×3 IMPLANT
KIT BASIN OR (CUSTOM PROCEDURE TRAY) ×3 IMPLANT
KIT MARKER MARGIN INK (KITS) ×3 IMPLANT
NEEDLE 18GX1X1/2 (RX/OR ONLY) (NEEDLE) ×6 IMPLANT
NEEDLE FILTER BLUNT 18X 1/2SAF (NEEDLE) ×2
NEEDLE FILTER BLUNT 18X1 1/2 (NEEDLE) ×1 IMPLANT
NEEDLE HYPO 25X1 1.5 SAFETY (NEEDLE) ×3 IMPLANT
NS IRRIG 1000ML POUR BTL (IV SOLUTION) ×3 IMPLANT
PACK SURGICAL SETUP 50X90 (CUSTOM PROCEDURE TRAY) ×3 IMPLANT
PENCIL BUTTON HOLSTER BLD 10FT (ELECTRODE) ×3 IMPLANT
SPONGE LAP 18X18 X RAY DECT (DISPOSABLE) ×3 IMPLANT
SUT MNCRL AB 4-0 PS2 18 (SUTURE) ×3 IMPLANT
SUT VIC AB 3-0 SH 18 (SUTURE) ×6 IMPLANT
SYR BULB 3OZ (MISCELLANEOUS) ×3 IMPLANT
SYR CONTROL 10ML LL (SYRINGE) ×6 IMPLANT
TOWEL OR 17X24 6PK STRL BLUE (TOWEL DISPOSABLE) IMPLANT
TOWEL OR 17X26 10 PK STRL BLUE (TOWEL DISPOSABLE) ×3 IMPLANT
TUBE CONNECTING 12'X1/4 (SUCTIONS) ×1
TUBE CONNECTING 12X1/4 (SUCTIONS) ×2 IMPLANT
YANKAUER SUCT BULB TIP NO VENT (SUCTIONS) ×3 IMPLANT

## 2016-04-25 NOTE — Discharge Instructions (Signed)
[facility] °Office Phone Number [phone] ° °BREAST BIOPSY/ PARTIAL MASTECTOMY: POST OP INSTRUCTIONS ° °Always review your discharge instruction sheet given to you by the facility where your surgery was performed. ° °IF YOU HAVE DISABILITY OR FAMILY LEAVE FORMS, YOU MUST BRING THEM TO THE OFFICE FOR PROCESSING.  DO NOT GIVE THEM TO YOUR DOCTOR. ° °1. A prescription for pain medication may be given to you upon discharge.  Take your pain medication as prescribed, if needed.  If narcotic pain medicine is not needed, then you may take acetaminophen (Tylenol) or ibuprofen (Advil) as needed. °2. Take your usually prescribed medications unless otherwise directed °3. If you need a refill on your pain medication, please contact your pharmacy.  They will contact our office to request authorization.  Prescriptions will not be filled after 5pm or on week-ends. °4. You should eat very light the first 24 hours after surgery, such as soup, crackers, pudding, etc.  Resume your normal diet the day after surgery. °5. Most patients will experience some swelling and bruising in the breast.  Ice packs and a good support bra will help.  Swelling and bruising can take several days to resolve.  °6. It is common to experience some constipation if taking pain medication after surgery.  Increasing fluid intake and taking a stool softener will usually help or prevent this problem from occurring.  A mild laxative (Milk of Magnesia or Miralax) should be taken according to package directions if there are no bowel movements after 48 hours. °7. Unless discharge instructions indicate otherwise, you may remove your bandages 24-48 hours after surgery, and you may shower at that time.  You may have steri-strips (small skin tapes) in place directly over the incision.  These strips should be left on the skin for 7-10 days.  If your surgeon used skin glue on the incision, you may shower in 24 hours.  The glue will flake off over the  next 2-3 weeks.  Any sutures or staples will be removed at the office during your follow-up visit. °8. ACTIVITIES:  You may resume regular daily activities (gradually increasing) beginning the next day.  Wearing a good support bra or sports bra minimizes pain and swelling.  You may have sexual intercourse when it is comfortable. °a. You may drive when you no longer are taking prescription pain medication, you can comfortably wear a seatbelt, and you can safely maneuver your car and apply brakes. °b. RETURN TO WORK:  ______________________________________________________________________________________ °9. You should see your doctor in the office for a follow-up appointment approximately two weeks after your surgery.  Your doctor’s nurse will typically make your follow-up appointment when she calls you with your pathology report.  Expect your pathology report 2-3 business days after your surgery.  You may call to check if you do not hear from us after three days. °10. OTHER INSTRUCTIONS: _______________________________________________________________________________________________ _____________________________________________________________________________________________________________________________________ °_____________________________________________________________________________________________________________________________________ °_____________________________________________________________________________________________________________________________________ ° °WHEN TO CALL YOUR DOCTOR: °1. Fever over 101.0 °2. Nausea and/or vomiting. °3. Extreme swelling or bruising. °4. Continued bleeding from incision. °5. Increased pain, redness, or drainage from the incision. ° °The clinic staff is available to answer your questions during regular business hours.  Please don’t hesitate to call and ask to speak to one of the nurses for clinical concerns.  If you have a medical emergency, go to the nearest  emergency room or call 911.  A surgeon from [facility] is always on call at the hospital. ° °For further questions, please visit [email or web address]  °

## 2016-04-25 NOTE — Anesthesia Preprocedure Evaluation (Signed)
Anesthesia Evaluation  Patient identified by MRN, date of birth, ID band Patient awake    Reviewed: Allergy & Precautions, NPO status , Patient's Chart, lab work & pertinent test results  Airway Mallampati: II  TM Distance: >3 FB Neck ROM: Full    Dental no notable dental hx.    Pulmonary sleep apnea , former smoker,    Pulmonary exam normal breath sounds clear to auscultation       Cardiovascular hypertension, Normal cardiovascular exam Rhythm:Regular Rate:Normal     Neuro/Psych Bipolar Disorder negative neurological ROS  negative psych ROS   GI/Hepatic negative GI ROS, Neg liver ROS,   Endo/Other  diabetesMorbid obesity  Renal/GU negative Renal ROS  negative genitourinary   Musculoskeletal negative musculoskeletal ROS (+)   Abdominal   Peds negative pediatric ROS (+)  Hematology negative hematology ROS (+)   Anesthesia Other Findings   Reproductive/Obstetrics negative OB ROS                             Anesthesia Physical Anesthesia Plan  ASA: III  Anesthesia Plan: General   Post-op Pain Management:    Induction: Intravenous  Airway Management Planned: Oral ETT  Additional Equipment:   Intra-op Plan:   Post-operative Plan: Extubation in OR  Informed Consent: I have reviewed the patients History and Physical, chart, labs and discussed the procedure including the risks, benefits and alternatives for the proposed anesthesia with the patient or authorized representative who has indicated his/her understanding and acceptance.   Dental advisory given  Plan Discussed with: CRNA and Surgeon  Anesthesia Plan Comments:         Anesthesia Quick Evaluation

## 2016-04-25 NOTE — Op Note (Signed)
Preoperative diagnosis: Stage I right  breast cancer lower -outer quadrant   Postoperative diagnosis: Same   Procedure: right  breast seed localized lumpectomy with left axillary sentinel lymph node mapping with methylene blue dye    Surgeon: Erroll Luna M.D .  Anesthesia: LMA with pectoral block anesthesia and local   EBL: 20 cc   Specimen: right  breast mass with clip and seed to pathology and one axillary sentinel node hot and blue   Drains: None   Indications for procedure: Patient presents for treatment of her right  breast cancer. She has opted for breast conservation after lengthy discussion of treatment options to include breast conservation surgery and mastectomy and reconstruction. Risks, benefits and alternatives discussed with the patient.The procedure has been discussed with the patient. Alternatives to surgery have been discussed with the patient. Risks of surgery include bleeding, Infection, Seroma formation, death, and the need for further surgery. The patient understands and wishes to proceed.Sentinel lymph node mapping and dissection has been discussed with the patient. Risk of bleeding, Infection, Seroma formation, Additional procedures,, Shoulder weakness , Shoulder stiffness, Nerve and blood vessel injury and reaction to the mapping dyes have been discussed. Alternatives to surgery have been discussed with the patient. The patient agrees to proceed.    Description of procedure: Patient underwent placement of right  breast seed at radiology earlier in the week. She presents to the holding area and questions are answered. Neoprobe was used to verify clip placement in the left breast. Patient underwent technetium sulfur colloid injection per protocol. Questions answered. Patient taken back to operating room and placed supine on the operating room table. Patient received 2 g of Ancef. After induction of LMA anesthesia right  breast was prepped and draped in a sterile fashion  and 4 cc of methylene blue dye were injected in a subareolar position. Of note, patient had pectoral block by anesthesia prior to this. Neoprobe was used to identify the radioactive seen in the left upper-outer quadrant. Curvilinear incision made around the NAC laterally  and dissection was carried around to excise all tissue around both the clip and seed. Radiograph showed the mass with gross negative margins. Both seed and clip were in the specimen. Specimen sent to pathology.  Neoprobe was switched to the technetium sulfur colloid setting. Hot spot identified in  the left axilla. Incision made in the inferior axillary hairline and dissection carried into the axilla. Hot and blue lymph node identified and excised. Background counts approached 0. Wound  was irrigated and closed with 3-0 Vicryl and 4-0 Monocryl. Lumpectomy site closed in a similar fashion. Dermabond applied. All final counts sponge, needle instruments found to be correct at this point. Patient awoke, taken to recovery in satisfactory condition.

## 2016-04-25 NOTE — Transfer of Care (Signed)
Immediate Anesthesia Transfer of Care Note  Patient: Rhonda Berg  Procedure(s) Performed: Procedure(s): RIGHT BREAST LUMPECTOMY WITH RADIOACTIVE SEED AND SENTINEL LYMPH NODE BIOPSY (Right)  Patient Location: PACU  Anesthesia Type:GA combined with regional for post-op pain  Level of Consciousness: awake, alert  and oriented  Airway & Oxygen Therapy: Patient Spontanous Breathing and Patient connected to nasal cannula oxygen  Post-op Assessment: Report given to RN, Post -op Vital signs reviewed and stable and Patient moving all extremities X 4  Post vital signs: Reviewed and stable  Last Vitals:  Vitals:   04/25/16 0655  BP: 115/87  Pulse: 83  Resp: 18  Temp: 36.6 C    Last Pain:  Vitals:   04/25/16 0655  TempSrc: Oral         Complications: No apparent anesthesia complications

## 2016-04-25 NOTE — Interval H&P Note (Signed)
History and Physical Interval Note:  04/25/2016 7:57 AM  Rhonda Berg  has presented today for surgery, with the diagnosis of RIGHT BREAST CANCER  The various methods of treatment have been discussed with the patient and family. After consideration of risks, benefits and other options for treatment, the patient has consented to  Procedure(s): BREAST LUMPECTOMY WITH RADIOACTIVE SEED AND SENTINEL LYMPH NODE BIOPSY (Right) as a surgical intervention .  The patient's history has been reviewed, patient examined, no change in status, stable for surgery.  I have reviewed the patient's chart and labs.  Questions were answered to the patient's satisfaction.     Viann Nielson A.

## 2016-04-25 NOTE — Anesthesia Procedure Notes (Signed)
Anesthesia Regional Block:  Pectoralis block  Pre-Anesthetic Checklist: ,, timeout performed, Correct Patient, Correct Site, Correct Laterality, Correct Procedure, Correct Position, site marked, Risks and benefits discussed,  Surgical consent,  Pre-op evaluation,  At surgeon's request and post-op pain management  Laterality: Right  Prep: chloraprep       Needles:  Injection technique: Single-shot  Needle Type: Echogenic Needle     Needle Length: 9cm 9 cm Needle Gauge: 21 G    Additional Needles:  Procedures: ultrasound guided (picture in chart) Pectoralis block Narrative:  Start time: 04/25/2016 8:10 AM End time: 04/25/2016 8:20 AM Injection made incrementally with aspirations every 5 mL.  Performed by: Personally  Anesthesiologist: Juliane Guest  Additional Notes: Patient tolerated the procedure well without complications

## 2016-04-25 NOTE — Anesthesia Postprocedure Evaluation (Signed)
Anesthesia Post Note  Patient: Rhonda Berg  Procedure(s) Performed: Procedure(s) (LRB): RIGHT BREAST LUMPECTOMY WITH RADIOACTIVE SEED AND SENTINEL LYMPH NODE BIOPSY (Right)  Patient location during evaluation: PACU Anesthesia Type: General and Regional Level of consciousness: awake and alert Pain management: pain level controlled Vital Signs Assessment: post-procedure vital signs reviewed and stable Respiratory status: spontaneous breathing, nonlabored ventilation, respiratory function stable and patient connected to nasal cannula oxygen Cardiovascular status: blood pressure returned to baseline and stable Postop Assessment: no signs of nausea or vomiting Anesthetic complications: no       Last Vitals:  Vitals:   04/25/16 1145 04/25/16 1200  BP: 105/81 115/79  Pulse: 74 70  Resp: 17 13  Temp:      Last Pain:  Vitals:   04/25/16 1130  TempSrc:   PainSc: Asleep                 Cherre Kothari S

## 2016-04-25 NOTE — Anesthesia Procedure Notes (Signed)
Procedure Name: Intubation Date/Time: 04/25/2016 9:11 AM Performed by: Mariea Clonts Pre-anesthesia Checklist: Patient identified, Emergency Drugs available, Suction available and Patient being monitored Patient Re-evaluated:Patient Re-evaluated prior to inductionOxygen Delivery Method: Circle System Utilized Preoxygenation: Pre-oxygenation with 100% oxygen Intubation Type: IV induction Ventilation: Mask ventilation without difficulty Laryngoscope Size: Miller and 2 Grade View: Grade I Tube type: Oral Tube size: 7.5 mm Number of attempts: 1 Airway Equipment and Method: Stylet and Oral airway Placement Confirmation: ETT inserted through vocal cords under direct vision,  positive ETCO2 and breath sounds checked- equal and bilateral Tube secured with: Tape Dental Injury: Teeth and Oropharynx as per pre-operative assessment

## 2016-04-25 NOTE — H&P (Signed)
Pre-op/Pre-procedure Orders Open     [facility]  Erroll Luna, MD  General Surgery   Additional Documentation   Encounter Info:   Billing Info,   History,   Allergies,   Detailed Report     All Notes   H&P by Erroll Luna, MD at 01/23/2016 10:11 AM   Author: Erroll Luna, MD Author Type: Physician Filed: 1 10:14 AM  Note Status: Signed Cosign: Cosign Not Required Encounter Date:  10:11 AM  Editor: Erroll Luna, MD (Physician)    Rebecca Eaton  Location: [facility] Patient #: [id number] DOB: 1940/05/31 Married / Language: English / Race: Black or African American Female  History of Present Illness Marcello Moores A. Aarib Pulido MD;  Patient words: Patient sent at the request of Dr. Edilia Bo for right breast mass. Patient was noted to have a right breast mass last month. She underwent mammogram with ultrasound and was found to have 2 small abnormal masses at 8:00 in the right breast just under the nipple. 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 Patient noticed a lump last month. Denies nipple discharge. She states there is a family history of breast cancer. She is sore bruise from her biopsy.                              CLINICAL DATA: Patient with multiple right breast masses, status post 2 ultrasound-guided biopsies today. EXAM: DIAGNOSTIC RIGHT MAMMOGRAM POST ULTRASOUND BIOPSY (2 biopsies) COMPARISON: Previous exam(s). FINDINGS: Mammographic images were obtained following ultrasound guided biopsy of suspicious masses within the right breast at the 8 o'clock axis, 1 cm from the nipple, and at the 8 o'clock axis, 2 cm from the nipple. After biopsy at the 8 o'clock axis, 1 cm from nipple, a ribbon shaped tissue marker was deployed into the biopsy cavity. After  biopsy at the 8 o'clock axis, 2 cm from the nipple, a coil shaped tissue marker was deployed into the biopsy cavity. Both clips appear appropriately positioned on this postprocedure mammogram. IMPRESSION: Postprocedure mammogram for clip placements. The 2 biopsy clips appear appropriately positioned at their respective biopsy sites. Final Assessment: Post Procedure Mammograms for Marker Placement Electronically Signed By: Franki Cabot M.D. On: 01/13/2016 14:47  ULTRASOUND BILATERAL BREAST  COMPARISON: Previous exam(s).  ACR Breast Density Category b: There are scattered areas of fibroglandular density.  FINDINGS: There is increasing density with underlying small masses in the right central breast correlating with the patient's symptoms. The increased density on the right spans up to 7 cm. Possible distortion in the upper outer left breast resolves on additional imaging. No other suspicious findings.  Mammographic images were processed with CAD.  On physical exam, a palpable lump is identified on the right.  Targeted ultrasound is performed, showing multiple small masses in the region of the patient's symptoms. A representative mass is seen at 8 o'clock, 2 cm from the nipple measuring 16 x 8 x 15 mm. An adjacent mass is seen at 8 o'clock, 1 cm from the nipple measuring 9 x 8 x 9 mm. Taken together, the 2 masses span a dimension of 3.2 cm on anti radial images and 3.8 cm on radial imaging. No axillary adenopathy.  No suspicious findings in the upper outer left breast.  IMPRESSION:          Patient:  Rhonda Berg, Rhonda Berg Collected: 01/13/2016 [facility]: [facility] Accession: [id number] Received: 01/13/2016 A. Malka So, MD DOB: [birth year] Age: 89 Gender: F Reported: 01/16/2016 [address] Patient Ph: [phone] MRN #: [id number] [place], [place] [address] Client Acc#: Chart #: [id number] Phone: [phone] Fax:  [phone] CC: CC: Darreld Mclean, MD REPORT OF SURGICAL PATHOLOGY ADDITIONAL INFORMATION: 2. FLUORESCENCE IN-SITU HYBRIDIZATION Results: HER2 - NEGATIVE RATIO OF HER2/CEP17 SIGNALS 1.62 AVERAGE HER2 COPY NUMBER PER CELL 3.65 Reference Range: NEGATIVE HER2/CEP17 Ratio <2.0 and average HER2 copy number <4.0 EQUIVOCAL HER2/CEP17 Ratio <2.0 and average HER2 copy number >=4.0 and <6.0 POSITIVE HER2/CEP17 Ratio >=2.0 or <2.0 and average HER2 copy number >=6.0 Claudette Laws MD Pathologist, Electronic Signature ( Signed 01/19/2016) 2. PROGNOSTIC INDICATORS Results: IMMUNOHISTOCHEMICAL AND MORPHOMETRIC ANALYSIS PERFORMED MANUALLY Estrogen Receptor: 100%, POSITIVE, STRONG STAINING INTENSITY Progesterone Receptor: 100%, POSITIVE, STRONG STAINING INTENSITY Proliferation Marker Ki67: 12% REFERENCE RANGE ESTROGEN RECEPTOR NEGATIVE 0% POSITIVE =>1% REFERENCE RANGE PROGESTERONE RECEPTOR NEGATIVE 0% POSITIVE =>1% 1 of 3 FINAL for ALEIAH, MOHAMMED [phone]) ADDITIONAL INFORMATION:(continued) All controls stained appropriately Enid Cutter MD Pathologist, Electronic Signature ( Signed 01/17/2016) FINAL DIAGNOSIS Diagnosis 1. 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 2. 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 Microscopic Comment 1. and 2. While grading is best performed one the resection specimen, the invasive and in situ carcinoma appear grade 2. Prognostic markers will be ordered. Dr. Saralyn Pilar has reviewed the case. The case was called to [facility] on 01/16/2016. Vicente Males MD Pathologist, Electronic Signature (Case signed 01/16/2016) Specimen Gross and Clinical Information Specimen Comment 1. In formalin: 2:15, extracted less than 1  min; right breast masses Specimen(s) Obtained: 1. Breast, right, needle core biopsy, 8 o'clock, 1cm from nipple 2. Breast, right, needle core biopsy, 8 o'clock, 2cm from nipple Specimen Clinical Information 1. 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 Received in formalin(TIF 2:15 pm, CIT less than 1 minute), labeled with patients name and site, right breat 8:00 10FN , are 3 cores of tan yellow tissue measuring 1.5 to 2.0 cm. One block submitted. 2. Received in formalin(TIF 2:15pm, CIT less than 1 minute), labeled with patients name and site, right breast 8:00 2 cm FN , are 3 cores of tan yellow tissue measuring 1.5 cm to 2.1 cm. One block submitted. Stain(s) used in Diagnosis: The following stain(s) were used in diagnosing the case: Her2 FISH, ER-ACIS, PR-ACIS, KI-67-ACIS. The control(s) stained appropriately. 2 of 3 FINAL for BRINLEY, ROSETE [phone]) Disclaimer PR progesterone receptor (16), immunohistochemical stains are performed on formalin fixed, paraffin embedded tissue using a 3,3"-diaminobenzidine (DAB) chromogen and Leica Bond Autostainer System. The staining intensity of the nucleus is scored manually and is reported as the percentage of tumor cell nuclei demonstrating specific nuclear staining. Estrogen receptor (6F11), immunohistochemical stains are performed on formalin fixed, paraffin embedded tissue using a 3,3"-diaminobenzidine (DAB) chromogen and Leica Bond Autostainer System. The staining intensity of the nucleus is scored manually and is reported as the percentage of tumor cell nuclei demonstrating specific nuclear staining. HER2 IQFISH pharmDX (code [phone]) is a direct fluorescence in-situ hybridization assay designed to quantitatively determine HER2 gene amplification in formalin-fixed, paraffin-embedded tissue specimens. It is performed at [facility] and is reported using ASCO/CAP scoring criteria published in 2013. Ki-67 (MM1),  immunohistochemical stains are performed on formalin fixed, paraffin embedded tissue using a 3,3"-diaminobenzidine (DAB) chromogen and [place]. The staining intensity of the nucleus is scored manually and is reported as the percentage of tumor cell nuclei demonstrating specific nuclear staining. Report signed out from the following location(s) Technical Component was performed at [facility],[place] [address].HLKT:[id number]., Technical Component was performed at [facility] [place], [place], [place] [address]. CLIA #: [id number], Interpretation was performed at [address], [place],  [address]. CLIA #: [id number], 3 of 3.  The patient is a 76 year old female.   Other Problems Elbert Ewings, CMA;  Anxiety Disorder Arthritis Back Pain Breast Cancer Depression Diabetes Mellitus Hypercholesterolemia Lump In Breast Sleep Apnea  Past Surgical History Elbert Ewings, CMA; 1 AM) Breast Biopsy Right. Cataract Surgery Bilateral. Colon Polyp Removal - Colonoscopy Hemorrhoidectomy Hysterectomy (not due to cancer) - Partial Knee Surgery Bilateral. Tonsillectomy  Diagnostic Studies History Elbert Ewings, CMA; 7 9:19 AM) Colonoscopy 1-5 years ago Mammogram within last year  Allergies Elbert Ewings, CMA; 1 No Known Drug Allergies   Medication History Elbert Ewings, CMA; 1) ClonazePAM (0.5MG Tablet, Oral) Active. Amantadine HCl (100MG Tablet, Oral) Active. Ventolin HFA (108 (90 Base)MCG/ACT Aerosol Soln, Inhalation) Active. Aspirin (81MG Tablet, Oral) Active. Furosemide (40MG Tablet, Oral) Active. Gabapentin (600MG Tablet, Oral) Active. Basaglar KwikPen (100UNIT/ML Soln Pen-inj, Subcutaneous) Active. MetFORMIN HCl (500MG Tablet, Oral) Active. Mirtazapine (15MG Tablet, Oral) Active. Propranolol HCl (10MG Tablet, Oral)  Active. QUEtiapine Fumarate (200MG Tablet, Oral) Active. Simvastatin (40MG Tablet, Oral) Active. Albuterol Sulfate HFA (108 (90 Base)MCG/ACT Aerosol Soln, Inhalation) Active. Medications Reconciled  Social History Elbert Ewings, [place];  Alcohol use Remotely quit alcohol use. Caffeine use Tea. Illicit drug use Remotely quit drug use. Tobacco use Former smoker.  Family History Elbert Ewings, [place];  Alcohol Abuse Brother, Sister. Breast Cancer Family Members In General, Mother, Sister. Diabetes Mellitus Family Members In General. Family history unknown First Degree Relatives   Age at menarche 24 years. Age of menopause <47 Contraceptive History Oral contraceptives. Gravida 2 Maternal age 58-20 Para 1     Review of Systems Elbert Ewings CMA; M) General Present- Appetite Loss and Fatigue. Not Present- Chills, Fever, Night Sweats, Weight Gain and Weight Loss. Skin Not Present- Change in Wart/Mole, Dryness, Hives, Jaundice, New Lesions, Non-Healing Wounds, Rash and Ulcer. HEENT Present- Wears glasses/contact lenses. Not Present- Earache, Hearing Loss, Hoarseness, Nose Bleed, Oral Ulcers, Ringing in the Ears, Seasonal Allergies, Sinus Pain, Sore Throat, Visual Disturbances and Yellow Eyes. Respiratory Present- Snoring and Wheezing. Not Present- Bloody sputum, Chronic Cough and Difficulty Breathing. Breast Present- Breast Mass. Not Present- Breast Pain, Nipple Discharge and Skin Changes. Cardiovascular Not Present- Chest Pain, Difficulty Breathing Lying Down, Leg Cramps, Palpitations, Rapid Heart Rate, Shortness of Breath and Swelling of Extremities. Gastrointestinal Not Present- Abdominal Pain, Bloating, Bloody Stool, Change in Bowel Habits, Chronic diarrhea, Constipation, Difficulty Swallowing, Excessive gas, Gets full quickly at meals, Hemorrhoids, Indigestion, Nausea, Rectal Pain and Vomiting. Female Genitourinary Not Present- Frequency, Nocturia, Painful  Urination, Pelvic Pain and Urgency. Neurological Present- Tingling and Weakness. Not Present- Decreased Memory, Fainting, Headaches, Numbness, Seizures, Tremor and Trouble walking. Psychiatric Present- Anxiety, Bipolar and Depression. Not Present- Change in Sleep Pattern, Fearful and Frequent crying. Endocrine Present- Hot flashes. Not Present- Cold Intolerance, Excessive Hunger, Hair Changes, Heat Intolerance and New Diabetes. Hematology Present- Blood Thinners. Not Present- Easy Bruising, Excessive bleeding, Gland problems, HIV and Persistent  Infections.  Vitals Elbert Ewings CMA; 01/23/2016 9:21 AM) 01/23/2016 9:21 AM Weight: 263 lb Height: 67in Body Surface Area: 2.27 m Body Mass Index: 41.19 kg/m  Temp.: 98.37F(Temporal)  Pulse: 87 (Regular)  BP: 126/78 (Sitting, Left Arm, Standard)      Physical Exam (Larri Yehle A. Tyjuan Demetro MD; 13 AM)  General Mental Status-Alert. General Appearance-Consistent with stated age. Hydration-Well hydrated. Voice-Normal.  Head and Neck Head-normocephalic, atraumatic with no lesions or palpable masses. Trachea-midline. Thyroid Gland Characteristics - normal size and consistency.  Eye Eyeball - Bilateral-Extraocular movements intact. Sclera/Conjunctiva - Bilateral-No scleral icterus.  Chest and Lung Exam Chest and lung exam reveals -quiet, even and easy respiratory effort with no use of accessory muscles and on auscultation, normal breath sounds, no adventitious sounds and normal vocal resonance. Inspection Chest Wall - Normal. Back - normal.  Breast Note: Bruising right breast in o'clock region at the border of the nipple. Small hematoma. Right axilla normal. Left breast normal. Left axilla normal.  Cardiovascular Cardiovascular examination reveals -normal heart sounds, regular rate and rhythm with no murmurs and normal pedal pulses bilaterally.  Neurologic Neurologic evaluation reveals -alert  and oriented x 3 with no impairment of recent or remote memory. Mental Status-Normal.  Lymphatic Head & Neck  General Head & Neck Lymphatics: Bilateral - Description - Normal. Axillary  General Axillary Region: Bilateral - Description - Normal. Tenderness - Non Tender.    Assessment & Plan (Sorah Falkenstein A. Donyea Beverlin MD; 01/23/2016 10:13 AM)  BREAST CANCER, RIGHT (C50.911) Impression: Discuss breast conservation versus mastectomy. Discussed reconstruction. Risks, benefits and long-term expectations of both were discussed. Patient has opted for right breast lumpectomy. The 2 clips are within a centimeter of each other looks like after reviewing the mammogram and a single seed could be used. We discussed that all lymph node mapping. She was to proceed with lumpectomy with sentinel lymph node mapping on the right. Risk of lumpectomy include bleeding, infection, seroma, more surgery, use of seed/wire, wound care, cosmetic deformity and the need for other treatments, death , blood clots, death. Pt agrees to proceed. Risk of sentinel lymph node mapping include bleeding, infection, lymphedema, shoulder pain. stiffness, dye allergy. cosmetic deformity , blood clots, death, need for more surgery. Pt agres to proceed.  MALIGNANT NEOPLASM OF LOWER-OUTER QUADRANT OF RIGHT BREAST OF FEMALE, ESTROGEN RECEPTOR POSITIVE (C50.511)  Current Plans Pt Education - [facility] Breast Cancer Information Given - [facility] "Breast Journey" Package Pt Education - Pamphlet Given - Breast Biopsy: discussed with patient and provided information. We discussed the staging and pathophysiology of breast cancer. We discussed all of the different options for treatment for breast cancer including surgery, chemotherapy, radiation therapy, Herceptin, and antiestrogen therapy. We discussed a sentinel lymph node biopsy as she does not appear to having lymph node involvement right now. We discussed the performance of that with injection of  radioactive tracer and blue dye. We discussed that she would have an incision underneath her axillary hairline. We discussed that there is a bout a 10-20% chance of having a positive node with a sentinel lymph node biopsy and we will await the permanent pathology to make any other first further decisions in terms of her treatment. One of these options might be to return to the operating room to perform an axillary lymph node dissection. We discussed about a 1-2% risk lifetime of chronic shoulder pain as well as lymphedema associated with a sentinel lymph node biopsy. We discussed the options for treatment of the breast cancer which included lumpectomy  versus a mastectomy. We discussed the performance of the lumpectomy with a wire placement. We discussed a 10-20% chance of a positive margin requiring reexcision in the operating room. We also discussed that she may need radiation therapy or antiestrogen therapy or both if she undergoes lumpectomy. We discussed the mastectomy and the postoperative care for that as well. We discussed that there is no difference in her survival whether she undergoes lumpectomy with radiation therapy or antiestrogen therapy versus a mastectomy. There is a slight difference in the local recurrence rate being 3-5% with lumpectomy and about 1% with a mastectomy. We discussed the risks of operation including bleeding, infection, possible reoperation. She understands her further therapy will be based on what her stages at the time of her operation.  Pt Education - [facility] breast cancer surgery: discussed with patient and provided information. Pt Education - ABC (After Breast Cancer) Class Info: discussed with patient and provided information.

## 2016-04-26 ENCOUNTER — Encounter (HOSPITAL_COMMUNITY): Payer: Self-pay | Admitting: Surgery

## 2016-04-27 ENCOUNTER — Telehealth: Payer: Self-pay | Admitting: *Deleted

## 2016-04-27 NOTE — Telephone Encounter (Signed)
Received order for oncotype testing. Requisition sent to pathology. Received by Keisha 

## 2016-05-01 NOTE — Op Note (Signed)
Preoperative diagnosis: Stage I right  breast cancer lower -outer quadrant   Postoperative diagnosis: Same   Procedure: right  breast seed localized lumpectomy with right  axillary sentinel lymph node mapping with methylene blue dye    Surgeon: Erroll Luna M.D .  Anesthesia: LMA with pectoral block anesthesia and local   EBL: 20 cc   Specimen: right  breast mass with clip and seed to pathology and one axillary sentinel node hot and blue   Drains: None   Indications for procedure: Patient presents for treatment of her right  breast cancer. She has opted for breast conservation after lengthy discussion of treatment options to include breast conservation surgery and mastectomy and reconstruction. Risks, benefits and alternatives discussed with the patient.The procedure has been discussed with the patient. Alternatives to surgery have been discussed with the patient. Risks of surgery include bleeding, Infection, Seroma formation, death, and the need for further surgery. The patient understands and wishes to proceed.Sentinel lymph node mapping and dissection has been discussed with the patient. Risk of bleeding, Infection, Seroma formation, Additional procedures,, Shoulder weakness , Shoulder stiffness, Nerve and blood vessel injury and reaction to the mapping dyes have been discussed. Alternatives to surgery have been discussed with the patient. The patient agrees to proceed.    Description of procedure: Patient underwent placement of right  breast seed at radiology earlier in the week. She presents to the holding area and questions are answered. Neoprobe was used to verify clip placement in the left breast. Patient underwent technetium sulfur colloid injection per protocol. Questions answered. Patient taken back to operating room and placed supine on the operating room table. Patient received 2 g of Ancef. After induction of LMA anesthesia right  breast was prepped and draped in a sterile  fashion and 4 cc of methylene blue dye were injected in a subareolar position. Of note, patient had pectoral block by anesthesia prior to this. Neoprobe was used to identify the radioactive seen in the  right upper-outer quadrant. Curvilinear incision made around the NAC laterally  and dissection was carried around to excise all tissue around both the clip and seed. Radiograph showed the mass with gross negative margins. Both seed and clip were in the specimen. Specimen sent to pathology.  Neoprobe was switched to the technetium sulfur colloid setting. Hot spot identified in  the  Right axilla. Incision made in the inferior axillary hairline and dissection carried into the axilla. Hot and blue lymph node identified and excised. Background counts approached 0. Wound  was irrigated and closed with 3-0 Vicryl and 4-0 Monocryl. Lumpectomy site closed in a similar fashion. Dermabond applied. All final counts sponge, needle instruments found to be correct at this point. Patient awoke, taken to recovery in satisfactory condition.

## 2016-05-03 DIAGNOSIS — C50511 Malignant neoplasm of lower-outer quadrant of right female breast: Secondary | ICD-10-CM | POA: Diagnosis not present

## 2016-05-04 ENCOUNTER — Telehealth: Payer: Self-pay | Admitting: *Deleted

## 2016-05-04 NOTE — Telephone Encounter (Signed)
Received Oncotype Dx score of 2/4%.  Placed a copy in Dr. Ernestina Penna box, gave Varney Biles a copy and took a copy to HIM to scan.

## 2016-05-07 ENCOUNTER — Telehealth: Payer: Self-pay | Admitting: Family Medicine

## 2016-05-07 ENCOUNTER — Encounter (HOSPITAL_COMMUNITY): Payer: Self-pay

## 2016-05-07 NOTE — Telephone Encounter (Signed)
Relation to OV:ANVB Call back [place]: [facility] Order  Reason for call:  Patient requesting Rx for "blood sugar kit" requesting everything that's included such as test strips, patient was informed by [facility] that Rx was sent twice, patient states she will run out, please advise

## 2016-05-08 ENCOUNTER — Telehealth: Payer: Self-pay | Admitting: *Deleted

## 2016-05-08 ENCOUNTER — Other Ambulatory Visit: Payer: Self-pay | Admitting: Emergency Medicine

## 2016-05-08 DIAGNOSIS — Z794 Long term (current) use of insulin: Principal | ICD-10-CM

## 2016-05-08 DIAGNOSIS — E118 Type 2 diabetes mellitus with unspecified complications: Secondary | ICD-10-CM

## 2016-05-08 MED ORDER — BLOOD GLUCOSE MONITORING SUPPL MISC: 3 refills | Status: DC

## 2016-05-08 MED ORDER — BLOOD GLUCOSE MONITORING SUPPL MISC: 3 refills | Status: AC

## 2016-05-08 NOTE — Telephone Encounter (Signed)
Rx sent to [facility] per pt request.

## 2016-05-08 NOTE — Telephone Encounter (Signed)
"  This is [employer].  We faxed on 05-02-2016 a Medical necessity form for this patient on 05-03-2015.  What is the best fax number to send this question & answer for to the provider to complete."  Provided fax number in the POD, [phone].

## 2016-05-08 NOTE — Telephone Encounter (Signed)
Left message for a return phone call to give her oncotype results.

## 2016-05-09 ENCOUNTER — Other Ambulatory Visit (INDEPENDENT_AMBULATORY_CARE_PROVIDER_SITE_OTHER): Payer: Medicare HMO

## 2016-05-09 ENCOUNTER — Encounter: Payer: Self-pay | Admitting: Neurology

## 2016-05-09 ENCOUNTER — Ambulatory Visit (INDEPENDENT_AMBULATORY_CARE_PROVIDER_SITE_OTHER): Payer: Medicare HMO | Admitting: Neurology

## 2016-05-09 VITALS — BP 118/80 | HR 74 | Ht 67.0 in | Wt 271.1 lb

## 2016-05-09 DIAGNOSIS — M545 Low back pain: Secondary | ICD-10-CM

## 2016-05-09 DIAGNOSIS — E1142 Type 2 diabetes mellitus with diabetic polyneuropathy: Secondary | ICD-10-CM | POA: Diagnosis not present

## 2016-05-09 DIAGNOSIS — G8929 Other chronic pain: Secondary | ICD-10-CM

## 2016-05-09 LAB — TSH: TSH: 0.67 u[IU]/mL (ref 0.35–4.50)

## 2016-05-09 LAB — VITAMIN B12: Vitamin B-12: 258 pg/mL (ref 211–911)

## 2016-05-09 MED ORDER — GABAPENTIN 600 MG PO TABS: 600.0000 mg | ORAL_TABLET | Freq: Four times a day (QID) | ORAL | 11 refills | Status: AC

## 2016-05-09 MED ORDER — TIZANIDINE HCL 2 MG PO TABS: 2.0000 mg | ORAL_TABLET | Freq: Every evening | ORAL | 5 refills | Status: DC | PRN

## 2016-05-09 NOTE — Progress Notes (Signed)
[facility] Clinic Note - Initial Visit   Date: 05/09/16  Rhonda Berg MRN: [id number] DOB: [birth year]   Dear Dr. Lorelei Pont:  Thank you for your kind referral of Rhonda Berg for consultation of neuropathy. Although her history is well known to you, please allow [place] to reiterate it for the purpose of our medical record. The patient was accompanied to the clinic by self.    History of Present Illness: Rhonda Berg is a 76 y.o. right-handed African American female with bipolar disorder, right breast cancer (Oct 2017), hyperlipidemia, hypertension, insulin dependent diabetes mellitus, chronic low back pain, and former smoker  presenting for evaluation of neuropathy.    Starting around 2014, she began experiencing burning pain of the legs, below the knees.  Pain is constant and worse when she is not on gabapentin.  Since starting gabapentin 600mg  four times daily, she has excellent pain relief.  Unfortunately, she ran out of her medication a few days ago and her pain has been much worse.  There is no numbness or similar pain the hands.  She denies any weakness of the legs. She had NCS/EMG of the legs in the spring of 2017, but does not know the results of that test.   She walks with a cane for the past 2 years because of low back pain.  Pain is worse with activity and walking and improved with rest.  She denies any radicular pain into her legs. She has had a number of MRIs of the lumbar spine which I do not have to reviews, but patient states she has "arthritis".  She has never had back surgery or has steroid injections.  She has tried physical therapy several times, but denies any improvement.    She was previously seeing neurologist, Dr. Modena Morrow, in [place], [place] and moved to [place] in 2017. Unfortunately, I do not have any of these records and will be requesting them to be faxed.   She also takes amantadine 100mg  twice daily for tardive  dyskinesia.    Out-side paper records, electronic medical record, and images have been reviewed where available and summarized as:  Lab Results  Component Value Date   HGBA1C 10.5 (H) 04/18/2016    Past Medical History:  Diagnosis Date  . Arthritis   . Bipolar 1 disorder ([place])   . Cancer ([place])    breast  . Depression   . Diabetes mellitus without complication ([place])   . Diabetic neuropathy ([place])   . Dyspnea   . Hypercholesteremia   . Hypertension   . Peripheral edema   . Proteinuria   . Sleep apnea    has CPAP but doesnt wear it    Past Surgical History:  Procedure Laterality Date  . ABDOMINAL HYSTERECTOMY    . BREAST LUMPECTOMY WITH RADIOACTIVE SEED AND SENTINEL LYMPH NODE BIOPSY Right 04/25/2016   Procedure: RIGHT BREAST LUMPECTOMY WITH RADIOACTIVE SEED AND SENTINEL LYMPH NODE BIOPSY;  Surgeon: Erroll Luna, MD;  Location: [facility];  Service: General;  Laterality: Right;  . COLONOSCOPY    . KNEE SURGERY     bilateral partial knee replacements     Medications:  Outpatient Encounter Prescriptions as of 05/09/2016  Medication Sig Note  . albuterol (PROVENTIL HFA;VENTOLIN HFA) 108 (90 Base) MCG/ACT inhaler Inhale 2 puffs into the lungs every 6 (six) hours as needed for wheezing or shortness of breath.   [name] amantadine (SYMMETREL) 100 MG capsule Take 100 mg by mouth 2 (two) times daily.   [name]  aspirin 81 MG tablet Take 81 mg by mouth daily.   . BD PEN NEEDLE NANO U/F 32G X 4 MM MISC Check sugar 2-3 times daily 02/20/2016: Received from: External Pharmacy  . Blood Glucose Monitoring Suppl MISC Pt uses one touch verio flex. Please dispense test strips #100 and lancets #100. She tests her sugar 2-3x daily   . clonazePAM (KLONOPIN) 1 MG tablet Take 0.5-1 mg by mouth 2 (two) times daily. 0.5 mg in the day and 1 mg at bedtime 02/29/2016: Received from: External Pharmacy  . divalproex (DEPAKOTE ER) 250 MG 24 hr tablet Take 1 tablet by mouth 2 (two) times daily. 04/13/2016: Received from:  External Pharmacy  . exemestane (AROMASIN) 25 MG tablet Take 1 tablet (25 mg total) by mouth daily after breakfast.   . furosemide (LASIX) 40 MG tablet Take 1 tablet (40 mg total) by mouth daily.   [name] gabapentin (NEURONTIN) 600 MG tablet Take 1 tablet (600 mg total) by mouth 4 (four) times daily.   [name] HYDROcodone-acetaminophen (NORCO) 5-325 MG tablet Take 1 tablet by mouth every 6 (six) hours as needed for moderate pain.   [name] LANTUS SOLOSTAR 100 UNIT/ML Solostar Pen Take 48 units subcutaneously once daily.  Adjust as needed (Patient taking differently: Inject 40 Units into the skin daily before breakfast. Adjust as needed)   . metFORMIN (GLUCOPHAGE) 1000 MG tablet Take 1 tablet (1,000 mg total) by mouth 2 (two) times daily with a meal.   . mirtazapine (REMERON) 15 MG tablet Take 15 mg by mouth at bedtime.   . propranolol (INDERAL) 10 MG tablet Take 1 tablet (10 mg total) by mouth 2 (two) times daily.   . QUEtiapine (SEROQUEL) 200 MG tablet Take 1 tablet by mouth at bedtime. 11/24/2015: Received from: External Pharmacy  . simvastatin (ZOCOR) 40 MG tablet Take 1 tablet (40 mg total) by mouth every evening.   . [DISCONTINUED] gabapentin (NEURONTIN) 600 MG tablet Take 600 mg by mouth 4 (four) times daily.    [name] tiZANidine (ZANAFLEX) 2 MG tablet Take 1 tablet (2 mg total) by mouth at bedtime as needed for muscle spasms (low back pain).    No facility-administered encounter medications on file as of 05/09/2016.      Allergies: No Known Allergies  Family History: Family History  Problem Relation Age of Onset  . Cancer Mother     breast cancer   . Cancer Sister     breast cancer   . Cancer Maternal Aunt 63    breast cancer   . Cancer Cousin     colon cancer     Social History: Social History  Substance Use Topics  . Smoking status: Former Smoker    Packs/day: 0.25    Years: 40.00    Quit date: 04/09/2002  . Smokeless tobacco: Never Used  . Alcohol use No   Social History   Social History  Narrative   Retired [employer]     Highest education:  Master's degree   She lives with her daughter and husband.     Review of Systems:  CONSTITUTIONAL: No fevers, chills, night sweats, or weight loss.   EYES: No visual changes or eye pain ENT: No hearing changes.  No history of nose bleeds.   RESPIRATORY: No cough, wheezing and shortness of breath.   CARDIOVASCULAR: Negative for chest pain, and palpitations.   GI: Negative for abdominal discomfort, blood in stools or black stools.  No recent change in bowel habits.  GU:  No history of incontinence.   MUSCLOSKELETAL: +history of joint pain or swelling.  No myalgias.   SKIN: Negative for lesions, rash, and itching.   HEMATOLOGY/ONCOLOGY: Negative for prolonged bleeding, bruising easily, and swollen nodes.  +history of cancer.   ENDOCRINE: Negative for cold or heat intolerance, polydipsia or goiter.   PSYCH:  +depression or anxiety symptoms.   NEURO: As Above.   Vital Signs:  BP 118/80   Pulse 74   Ht 5\' 7"  (1.702 m)   Wt 271 lb 1 oz (123 kg)   SpO2 92%   BMI 42.45 kg/m    General Medical Exam:   General:  Well appearing, comfortable, morbidly obese.   Eyes/ENT: see cranial nerve examination.   Neck: No masses appreciated.  Full range of motion without tenderness.  No carotid bruits. Respiratory:  Clear to auscultation, good air entry bilaterally.   Cardiac:  Regular rate and rhythm, no murmur.   Extremities:  No deformities, edema, or skin discoloration.  Skin:  No rashes or lesions.  Neurological Exam: MENTAL STATUS including orientation to time, place, person, recent and remote memory, attention span and concentration, language, and fund of knowledge is normal.  Speech is not dysarthric.  CRANIAL NERVES: II:  No visual field defects.  Unremarkable fundi.   III-IV-VI: Pupils equal round and reactive to light.  Normal conjugate, extra-ocular eye movements in all directions of gaze.  No nystagmus.  No ptosis. V:   Normal facial sensation.   VII:  Normal facial symmetry and movements.  [name]  VIII:  Normal hearing and vestibular function.   IX-X:  Normal palatal movement.   XI:  Normal shoulder shrug and head rotation.   XII:  Normal tongue strength and range of motion, no deviation or fasciculation.  MOTOR:  Mild intention tremor of the hands bilaterally. No atrophy or fasciculations.  No pronator drift.  Tone is normal.    Right Upper Extremity:    Left Upper Extremity:    Deltoid  5/5   Deltoid  5/5   Biceps  5/5   Biceps  5/5   Triceps  5/5   Triceps  5/5   Wrist extensors  5/5   Wrist extensors  5/5   Wrist flexors  5/5   Wrist flexors  5/5   Finger extensors  5/5   Finger extensors  5/5   Finger flexors  5/5   Finger flexors  5/5   Dorsal interossei  5/5   Dorsal interossei  5/5   Abductor pollicis  5/5   Abductor pollicis  5/5   Tone (Ashworth scale)  0  Tone (Ashworth scale)  0   Right Lower Extremity:    Left Lower Extremity:    Hip flexors  5/5   Hip flexors  5/5   Hip extensors  5/5   Hip extensors  5/5   Knee flexors  5/5   Knee flexors  5/5   Knee extensors  5/5   Knee extensors  5/5   Dorsiflexors  5/5   Dorsiflexors  5/5   Plantarflexors  5/5   Plantarflexors  5/5   Toe extensors  5/5   Toe extensors  5/5   Toe flexors  5/5   Toe flexors  5/5   Tone (Ashworth scale)  0  Tone (Ashworth scale)  0   MSRs:  Right  Left brachioradialis 2+  brachioradialis 2+  biceps 2+  biceps 2+  triceps 2+  triceps 2+  patellar 0  Patellar 0  ankle jerk 0  ankle jerk 0  Hoffman no  Hoffman no  plantar response down  plantar response down   SENSORY:  Vibration is mildly reduced at the great toe bilaterally.  Temperature and pin prick intact.  There is sway with Rhomberg testing.  COORDINATION/GAIT: Normal finger-to- nose-finger.  Mild bradykinesias with finger tapping with reduced amplitude bilaterally.  She is unable to rise from  a chair without using arms.  Gait is wide-based and stable. She unsteady with stressed gait, but still able to perform.  She cannot peform tandem gait.    IMPRESSION: 1.  Diabetic polyneuropathy affecting the legs, poorly controlled diabetes with last HbA1c 10.5, but she is motivated to have this under better control and is working with her PCP 2.  Chronic low back pain.  Previously had imaging of the lumbar spine which I will request to review 3.  Gait abnormality due to #1, #2, and morbid obesity 4.  Bipolar disorder complicated by tardive dyskinesias, followed by psychiatry 5.  Recently diagnosed right breast cancer, undergoing work-up   PLAN/RECOMMENDATIONS:  1.  Check TSH, vitamin B12, SPEP with IFE for other secondary causes of neuropathy 2.  Start tizanidine 2mg  at bedtime for low back pain 3.  Continue gabapentin 600mg  four times daily 4.  Request records (clinic notes, NCS/EMG, and MRI reports) from her neurologist in [place], Dr. Modena Morrow  Return to clinic in 6 months.   The duration of this appointment visit was 40 minutes of face-to-face time with the patient.  Greater than 50% of this time was spent in counseling, explanation of diagnosis, planning of further management, and coordination of care.   Thank you for allowing me to participate in patient's care.  If I can answer any additional questions, I would be pleased to do so.    Sincerely,    Inessa Wardrop K. Posey Pronto, DO

## 2016-05-09 NOTE — Patient Instructions (Addendum)
1.  Start tizanidine 2mg  at bedtime as needed for low back pain.  Side effects is sleepiness and grogginess.  If you are not having these side effects, we can increase the dose to 1 tablet twice daily, but you will need to call my office.  2.  Continue gabapentin 600mg  four times daily  3.  We will request your records from Dr. Modena Morrow  4.  Check labs  Return to clinic in 6 month

## 2016-05-11 LAB — PROTEIN ELECTROPHORESIS, SERUM
ALPHA-1-GLOBULIN: 0.2 g/dL (ref 0.2–0.3)
Albumin ELP: 3.9 g/dL (ref 3.8–4.8)
Alpha-2-Globulin: 1 g/dL — ABNORMAL HIGH (ref 0.5–0.9)
BETA 2: 0.4 g/dL (ref 0.2–0.5)
Beta Globulin: 0.4 g/dL (ref 0.4–0.6)
GAMMA GLOBULIN: 1 g/dL (ref 0.8–1.7)
TOTAL PROTEIN, SERUM ELECTROPHOR: 7 g/dL (ref 6.1–8.1)

## 2016-05-11 LAB — IMMUNOFIXATION ELECTROPHORESIS
IgA: 236 mg/dL (ref 81–463)
IgG (Immunoglobin G), Serum: 1124 mg/dL (ref 694–1618)
IgM, Serum: 20 mg/dL — ABNORMAL LOW (ref 48–271)

## 2016-05-14 ENCOUNTER — Telehealth: Payer: Self-pay | Admitting: *Deleted

## 2016-05-14 NOTE — Telephone Encounter (Signed)
Attempted to contact patient.  No answer and no voicemail.  Will try again.   

## 2016-05-14 NOTE — Telephone Encounter (Signed)
-----   Message from Alda Berthold, DO sent at 05/11/2016  2:49 PM EST ----- Please inform patient she has low-normal levels of B12 and I recommend she start vitamin B12 1042mcg daily. Thanks

## 2016-05-15 ENCOUNTER — Telehealth: Payer: Self-pay | Admitting: *Deleted

## 2016-05-15 NOTE — Telephone Encounter (Signed)
Patient given results and instructions.   

## 2016-05-15 NOTE — Telephone Encounter (Signed)
-----   Message from Alda Berthold, DO sent at 05/11/2016  2:49 PM EST ----- Please inform patient she has low-normal levels of B12 and I recommend she start vitamin B12 1079mcg daily. Thanks

## 2016-05-15 NOTE — Telephone Encounter (Signed)
Spoke with patient's and asked if he would have her call me when she gets a chance.

## 2016-05-15 NOTE — Telephone Encounter (Signed)
-----   Message from Alda Berthold, DO sent at 05/11/2016  2:49 PM EST ----- Please inform patient she has low-normal levels of B12 and I recommend she start vitamin B12 1034mcg daily. Thanks

## 2016-05-16 NOTE — Progress Notes (Signed)
[place]  [phone] [phone]) [phone]  Clinic Follow Up Note   Patient Care Team: Darreld Mclean, MD as PCP - General (Family Medicine) Erroll Luna, MD as Consulting Physician (General Surgery) Eppie Gibson, MD as Attending Physician (Radiation Oncology) Truitt Merle, MD as Consulting Physician (Hematology) 05/18/2016   CHIEF COMPLAINTS:  Follow up right breast cancer  Oncology History   Cancer Staging Breast cancer of lower-outer quadrant of right female breast [facility]) Staging form: Breast, AJCC 7th Edition - Clinical stage from 01/13/2016: Stage IA (T1c(m), N0, M0) - Signed by Truitt Merle, MD on 01/30/2016 - Pathologic stage from 04/25/2016: Stage IIA (T2, N0, cM0) - Signed by Truitt Merle, MD on 05/18/2016       Breast cancer of lower-outer quadrant of right female breast ([place])   05/27/2015 Oncotype testing    Oncotype DX score of 2, low risk. 10 year risk of distant recurrence of 4% on Tamoxifen alone.      01/11/2016 Mammogram    Diagnostic mammogram and ultrasound showed multiple small masses in the lower outer quadrant of the right breast, 2 representative mass at 8:00 position measuring 1.6 and the 0.9 cm. The axillary adenopathy.       01/13/2016 Initial Diagnosis    Breast cancer of lower-outer quadrant of right female breast ([place])      01/13/2016 Initial Biopsy    Right breast 8:00 position two biopsies showed invasive ductal carcinoma, and intermediate grade DCIS.      01/13/2016 Receptors her2    ER 100% positive, PR 100% positive, HER-2 negative, Ki-67 12%       03/08/2016 -  Neo-Adjuvant Anti-estrogen oral therapy    Exemestane 25 mg daily      04/25/2016 Pathology Results    Right Lumpectomy and Right SLN Biopsy 04/25/2016 1. Breast, lumpectomy, Right INVASIVE DUCTAL CARCINOMA, GRADE 2, SPANNING 2.7 CM  DUCTAL CARCINOMA IN SITU, GRADE 1 ALL MARGINS OF RESECTION ARE NEGATIVE FOR CARCINOMA 2. Lymph node, sentinel, biopsy,  Right axillary ONE BENIGN LYMPH NODE (0/1) 3. Breast, excision, Right additional anterior margin BENIGN BREAST TISSUE      04/25/2016 Oncotype testing    Oncotype DX score of 2, low risk. 10 year risk of distant recurrence of 4% on Tamoxifen alone.      HISTORY OF PRESENTING ILLNESS:  Rhonda Berg 76 y.o. female with past medical history of diabetes, arthritis, bipolar, is here because of her recently diagnosed right breast cancer. She presents to my clinic by herself today.  She has been having routine annual mammogram which has been normal. she felt a right breast lump about one month ago, no significant tenderness, no skin change or nipple discharge. she was seen by her PCP and was sent for diagnostic mammogram and ultrasound, which showed multiple masses in the lower outer quadrant of right breast, the largest one 1.6cm. no axillary adenopathy. 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 She was referred to breast surgeon Dr. Brantley Stage, and was offered lumpectomy and sentinel lymph node biopsy.   She has chronic back pain, uses a crane to get around. She is married, lives with her husband and daughter, her husband has health issue also, so they move to their daughter's house in 06/2015. She is able to do housework, cooking, laundry, etc. she does not exercise regularly, but remains to be moderately  physically active.  She has history of bipolar, depression and anxiety, on 3 medications, and her mood disorder seems to be controlled. She also has type 2 diabetes, used to be on insulin, now on metformin alone. She has strong family history of breast cancer.   GYN HISTORY  Menarchal: 10 LMP: 30's (she had hysterectomy for fibroids) Contraceptive: none  HRT: none  G2P1: one miscarriage, one daughter who is 3 yo   CURRENT THERAPY:  Exemestane since 03/08/16. Pending adjuvant radiaiton.  INTERIM HISTORY:  Rhonda Berg for follow-up. The patient had a right lumpectomy and right SLN biopsy on 04/25/16. This revealed grade 2 IDC measuring 2.7 cm and grade 1 DCIS. The margins were clear. 1 biopsied right axillary sentinel lymph node was negative. The patient's Oncotype score was 2. The patient denies pain and denies right arm swelling. The patient states she saw Dr. Brantley Stage and is scheduled to see Dr. Isidore Moos. She has chronic back pain and some knee pain, walks with a cane. She has chronic mild hot flashes. The patient states she is still taking Exemestane. The patient presents with her daughter.  MEDICAL HISTORY:  Past Medical History:  Diagnosis Date  . Arthritis   . Bipolar 1 disorder ([place])   . Cancer ([place])    breast  . Depression   . Diabetes mellitus without complication ([place])   . Diabetic neuropathy ([place])   . Dyspnea   . Hypercholesteremia   . Hypertension   . Peripheral edema   . Proteinuria   . Sleep apnea    has CPAP but doesnt wear it    SURGICAL HISTORY: Past Surgical History:  Procedure Laterality Date  . ABDOMINAL HYSTERECTOMY    . BREAST LUMPECTOMY WITH RADIOACTIVE SEED AND SENTINEL LYMPH NODE BIOPSY Right 04/25/2016   Procedure: RIGHT BREAST LUMPECTOMY WITH RADIOACTIVE SEED AND SENTINEL LYMPH NODE BIOPSY;  Surgeon: Erroll Luna, MD;  Location: [facility];  Service: General;  Laterality: Right;  . COLONOSCOPY    . KNEE SURGERY     bilateral partial knee replacements    SOCIAL HISTORY: Social History   Social History  . Marital status: Married    Spouse name: N/A  . Number of children: N/A  . Years of education: N/A   Occupational History  . Not on file.   Social History Main Topics  . Smoking status: Former Smoker    Packs/day: 0.25    Years: 40.00    Quit date: 04/09/2002  . Smokeless tobacco: Never Used  . Alcohol use No  . Drug use: No     Comment: when she was in [age]   .  Sexual activity: Not on file   Other Topics Concern  . Not on file   Social History Narrative   Retired [employer].     Highest education:  Master's degree   She lives with her daughter and husband.     FAMILY HISTORY: Family History  Problem Relation Age of Onset  . Cancer Mother     breast cancer   . Cancer Sister     breast cancer   . Cancer Maternal Aunt 63    breast cancer   . Cancer Cousin     colon cancer     ALLERGIES:  has No Known Allergies.  MEDICATIONS:  Current Outpatient Prescriptions  Medication Sig Dispense Refill  . albuterol (PROVENTIL HFA;VENTOLIN HFA) 108 (90 Base) MCG/ACT inhaler Inhale 2 puffs into the lungs every 6 (six) hours as needed for  wheezing or shortness of breath. 1 Inhaler 6  . amantadine (SYMMETREL) 100 MG capsule Take 100 mg by mouth 2 (two) times daily.    [name] aspirin 81 MG tablet Take 81 mg by mouth daily.    . BD PEN NEEDLE NANO U/F 32G X 4 MM MISC Check sugar 2-3 times daily    . Blood Glucose Monitoring Suppl MISC Pt uses one touch verio flex. Please dispense test strips #100 and lancets #100. She tests her sugar 2-3x daily 100 each 3  . clonazePAM (KLONOPIN) 1 MG tablet Take 0.5-1 mg by mouth 2 (two) times daily. 0.5 mg in the day and 1 mg at bedtime    . divalproex (DEPAKOTE ER) 250 MG 24 hr tablet Take 1 tablet by mouth 2 (two) times daily.    [name] exemestane (AROMASIN) 25 MG tablet Take 1 tablet (25 mg total) by mouth daily after breakfast. 30 tablet 2  . furosemide (LASIX) 40 MG tablet Take 1 tablet (40 mg total) by mouth daily. 30 tablet 6  . gabapentin (NEURONTIN) 600 MG tablet Take 1 tablet (600 mg total) by mouth 4 (four) times daily. 120 tablet 11  . HYDROcodone-acetaminophen (NORCO) 5-325 MG tablet Take 1 tablet by mouth every 6 (six) hours as needed for moderate pain. 30 tablet 0  . LANTUS SOLOSTAR 100 UNIT/ML Solostar Pen Take 48 units subcutaneously once daily.  Adjust as needed (Patient taking differently: Inject 40  Units into the skin daily before breakfast. Adjust as needed) 15 mL 5  . metFORMIN (GLUCOPHAGE) 1000 MG tablet Take 1 tablet (1,000 mg total) by mouth 2 (two) times daily with a meal. 180 tablet 3  . mirtazapine (REMERON) 15 MG tablet Take 15 mg by mouth at bedtime.    . propranolol (INDERAL) 10 MG tablet Take 1 tablet (10 mg total) by mouth 2 (two) times daily. 60 tablet 3  . QUEtiapine (SEROQUEL) 200 MG tablet Take 1 tablet by mouth at bedtime.    . simvastatin (ZOCOR) 40 MG tablet Take 1 tablet (40 mg total) by mouth every evening. 30 tablet 6  . tiZANidine (ZANAFLEX) 2 MG tablet Take 1 tablet (2 mg total) by mouth at bedtime as needed for muscle spasms (low back pain). 30 tablet 5   No current facility-administered medications for this visit.     REVIEW OF SYSTEMS:   Constitutional: Denies fevers, chills or abnormal night sweats Eyes: Denies blurriness of vision, double vision or watery eyes Ears, nose, mouth, throat, and face: Denies mucositis or sore throat Respiratory: Denies cough (+) dyspnea on exertion Cardiovascular: Denies palpitation, chest discomfort or lower extremity swelling Gastrointestinal:  Denies nausea, heartburn or change in bowel habits Skin: Denies abnormal skin rashes Lymphatics: Denies new lymphadenopathy or easy bruising Neurological:Denies numbness, tingling or new weaknesses Behavioral/Psych: Mood is stable, no new changes  All other systems were reviewed with the patient and are negative.  PHYSICAL EXAMINATION: ECOG PERFORMANCE STATUS: 1 - Symptomatic but completely ambulatory  Vitals:   05/18/16 0855  BP: 118/77  Pulse: 85  Resp: 18  Temp: 97.8 F (36.6 C)  TempSrc: Oral  SpO2: 95%  Weight: 274 lb 3.2 oz (124.4 kg)  Height: 5' 7"  (1.702 m)    GENERAL:alert, no distress and comfortable SKIN: skin color, texture, turgor are normal, no rashes or significant lesions EYES: normal, conjunctiva are pink and non-injected, sclera clear OROPHARYNX:no  exudate, no erythema and lips, buccal mucosa, and tongue normal  NECK: supple, thyroid normal size, non-tender, without  nodularity LYMPH:  no palpable lymphadenopathy in the cervical, axillary or inguinal LUNGS: clear to auscultation and percussion with normal breathing effort HEART: regular rate & rhythm and no murmurs and no lower extremity edema ABDOMEN:abdomen soft, non-tender and normal bowel sounds Musculoskeletal:no cyanosis of digits and no clubbing  PSYCH: alert & oriented x 3 with fluent speech NEURO: no focal motor/sensory deficits Breasts: Breast inspection showed a right axillary incision and right breast incision healing well with no discharge.   LABORATORY DATA:  I have reviewed the data as listed CBC Latest Ref Rng & Units 04/18/2016 04/13/2016 02/16/2016  WBC 4.0 - 10.5 K/uL 5.8 6.3 6.7  Hemoglobin 12.0 - 15.0 g/dL 14.4 14.2 15.8(H)  Hematocrit 36.0 - 46.0 % 45.1 43.9 47.2(H)  Platelets 150 - 400 K/uL 185 195 179   CMP Latest Ref Rng & Units 04/18/2016 04/13/2016 02/16/2016  Glucose 65 - 99 mg/dL 87 142(H) 311(H)  BUN 6 - 20 mg/dL 14 13.6 17  Creatinine 0.44 - 1.00 mg/dL 0.88 0.9 0.91  Sodium 135 - 145 mmol/L 142 144 135  Potassium 3.5 - 5.1 mmol/L 4.1 4.4 4.4  Chloride 101 - 111 mmol/L 103 - 96(L)  CO2 22 - 32 mmol/L 28 30(H) 23  Calcium 8.9 - 10.3 mg/dL 9.3 9.6 10.1  Total Protein 6.4 - 8.3 g/dL - 7.5 7.5  Total Bilirubin 0.20 - 1.20 mg/dL - 0.22 0.8  Alkaline Phos 40 - 150 U/L - 78 93  AST 5 - 34 U/L - 16 27  ALT 0 - 55 U/L - 17 26   Pathology Report Oncotype DX 04/25/2016   Diagnosis 04/25/2016 1. Breast, lumpectomy, Right INVASIVE DUCTAL CARCINOMA, GRADE 2, SPANNING 2.7 CM DUCTAL CARCINOMA IN SITU, GRADE 1 ALL MARGINS OF RESECTION ARE NEGATIVE FOR CARCINOMA 2. Lymph node, sentinel, biopsy, Right axillary ONE BENIGN LYMPH NODE (0/1) 3. Breast, excision, Right additional anterior margin BENIGN BREAST TISSUE Microscopic Comment 1. BREAST, INVASIVE  TUMOR Procedure: Lumpectomy Laterality: Right Tumor Size: 2.7 cm Histologic Type: Ductal carcinoma Grade: 2 Tubular Differentiation: 2 Nuclear Pleomorphism: 2 Mitotic Count: 2 Ductal Carcinoma in Situ (DCIS): Present Extent of Tumor: Skin: Negative Nipple: Negative Skeletal muscle: Negative Margins: Invasive carcinoma, distance from closest margin: 0.5 cm from inferior margins DCIS, distance from closest margin: 0.1 mm from lateral, posterior and inferior margin Regional Lymph Nodes: Number of Lymph Nodes Examined: 1 Number of Sentinel Lymph Nodes Examined: 1 Lymph Nodes with Macrometastases: 0 Lymph Nodes with Micrometastases: 0 1 of 3 FINAL for RAEGAN, WINDERS ([id number]) Microscopic Comment(continued) Lymph Nodes with Isolated Tumor Cells: 0 Breast Prognostic Profile: [id number] Estrogen Receptor: 100% Progesterone Receptor: 100% Her2: Negative Ki-67: 12% Pathologic Stage Classification (pTNM, AJCC 8th Edition): Primary Tumor (pT): pT2 Regional Lymph Nodes (pN): pN0 Distant Metastases (pM): x Casimer Lanius MD Pathologist, Electronic Signature (Case signed 04/26/2016) IAGNOSIS Diagnosis 01/13/2016 1. 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 2. 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 Microscopic Comment 1. and 2. While grading is best performed one the resection specimen, the invasive and in situ carcinoma appear grade 2. Prognostic markers will be ordered. Dr. Saralyn Pilar has reviewed the case. The case was called to [facility] on 01/16/2016. Results: IMMUNOHISTOCHEMICAL AND MORPHOMETRIC ANALYSIS PERFORMED MANUALLY Estrogen Receptor: 100%, POSITIVE, STRONG STAINING INTENSITY Progesterone Receptor: 100%,  POSITIVE, STRONG STAINING  INTENSITY Proliferation Marker Ki67: 12% Results: HER2 - NEGATIVE RATIO OF HER2/CEP17 SIGNALS 1.62 AVERAGE HER2 COPY NUMBER PER CELL 3.65    RADIOGRAPHIC STUDIES: I have personally reviewed the radiological images as listed and agreed with the findings in the report. Mm Breast Surgical Specimen  Result Date: 04/25/2016 CLINICAL DATA:  Evaluate specimen EXAM: SPECIMEN RADIOGRAPH OF THE RIGHT BREAST COMPARISON:  Previous exam(s). FINDINGS: Status post excision of the right breast. The radioactive seed and both biopsy markers clip are present, completely intact, and were marked for pathology. IMPRESSION: Specimen radiograph of the right breast. Electronically Signed   By: Dorise Bullion III M.D   On: 04/25/2016 10:15   Mm Rt Radioactive Seed Loc Mammo Guide  Result Date: 04/24/2016 CLINICAL DATA:  Preoperative right breast radioactive seed localization. EXAM: MAMMOGRAPHIC GUIDED RADIOACTIVE SEED LOCALIZATION OF THE RIGHT BREAST COMPARISON:  Previous exam(s). FINDINGS: Patient presents for radioactive seed localization prior to right breast lumpectomy. I met with the patient and we discussed the procedure of seed localization including benefits and alternatives. We discussed the high likelihood of a successful procedure. We discussed the risks of the procedure including infection, bleeding, tissue injury and further surgery. We discussed the low dose of radioactivity involved in the procedure. Informed, written consent was given. The usual time-out protocol was performed immediately prior to the procedure. Using mammographic guidance, sterile technique, 1% lidocaine and an I-125 radioactive seed, 2 post biopsy marker clips were localized with the seed placed between the two using a superior approach. The follow-up mammogram images confirm the seed in the expected location and were marked for Dr. Brantley Stage. Follow-up survey of the patient confirms presence of the radioactive seed. Order number of I-125  seed:  [id number]. Total activity: 1.700 millicurie Reference Date: 18th December 2017 The patient tolerated the procedure well and was released from the [facility]. She was given instructions regarding seed removal. IMPRESSION: Radioactive seed localization of right breast breast. No apparent complications. Electronically Signed   By: Fidela Salisbury M.D.   On: 04/24/2016 14:17    ASSESSMENT & PLAN:  76 y.o. post menopause Caucasian female, with past medical history of diabetes, arthritis, bipolar, presented with a palpable right breast masses  1. 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 The margins were clear. 1 biopsied right axillary sentinel lymph node was negative.  -the Oncotype Dx result was reviewed with her in details. She has low risk based on the recurrence score, which predicts 10 year distant recurrence after 5 years of tamoxifen 4%. There is no benefit of adjuvant chemotherapy in low risk disease, I do not recommend adjuvant chemotherapy. She is quite happy to hear the news. -she will proceed to adjuvant radiation next  --Given the strong ER and PR positivity, I do recommend adjuvant aromatase inhibitor to reduce her risk of cancer recurrence,  The potential benefit and side effects, which includes but not limited to, hot flash, skin and vaginal dryness, metabolic changes ( increased blood glucose, cholesterol, weight, etc.), slightly in increased risk of cardiovascular disease, cataracts, muscular and joint discomfort, osteopenia and osteoporosis, etc, were discussed with her in great details. She is interested, and we'll start after she completes radiation. -She has started exemestane before breast surgery, has been tolerating well.  She will  continue, but will hold during her radiation therapy. -The patient lives in [facility] and is thinking about having radiation there, I advised her to discuss this with Dr. Isidore Moos. -We discussed breast cancer surveillance, she will continue annual screening mammogram, I encouraged her to do self exam, and follow-up with [place] routinely. -I encouraged her to have healthy diet and exercise regularly.  2. DM -Much improved lately, she will continue follow-up with her primary care physician  3. Bipolar disorder -Seem to be well controlled, continue medication  4. Arthritis -She has mild chronic back pain and knee pain, tolerable, she uses a cane  5. Dyspnea -Encouraged her to follow up with her PCP regarding this.  PLAN -She will stop exemestane before starting adjuvant breast radiation and resume after she completes radiation. -I reviewed her surgical pathology and Oncotype today. She is appropriate to proceed with radiation. She will follow up with Dr. Isidore Moos on 06/04/16 to further discuss radiation and a possible referral to [facility] where the patient lives. -Lab and f/u in 3 months.  All questions were answered. The patient knows to call the clinic with any problems, questions or concerns.  I spent 25 minutes counseling the patient face to face. The total time spent in the appointment was 30 minutes and more than 50% was on counseling.     Truitt Merle, MD 05/18/2016 2:52 PM  This document serves as a record of services personally performed by Truitt Merle, MD. It was created on her behalf by Darcus Austin, a trained medical scribe. The creation of this record is based on the scribe's personal observations and the provider's statements to them. This document has been checked and approved by the attending provider.

## 2016-05-18 ENCOUNTER — Ambulatory Visit: Payer: Medicare HMO

## 2016-05-18 ENCOUNTER — Ambulatory Visit
Admission: RE | Admit: 2016-05-18 | Discharge: 2016-05-18 | Disposition: A | Discharge status: Home / Self Care | Payer: Medicare HMO | Source: Ambulatory Visit | Attending: Radiation Oncology | Admitting: Radiation Oncology

## 2016-05-18 ENCOUNTER — Encounter: Payer: Self-pay | Admitting: Hematology

## 2016-05-18 ENCOUNTER — Ambulatory Visit (HOSPITAL_BASED_OUTPATIENT_CLINIC_OR_DEPARTMENT_OTHER): Payer: Medicare HMO | Admitting: Hematology

## 2016-05-18 ENCOUNTER — Telehealth: Payer: Self-pay | Admitting: Hematology

## 2016-05-18 VITALS — BP 118/77 | HR 85 | Temp 97.8°F | Resp 18 | Ht 67.0 in | Wt 274.2 lb

## 2016-05-18 DIAGNOSIS — Z170 Estrogen receptor positive status [ER+]: Secondary | ICD-10-CM | POA: Insufficient documentation

## 2016-05-18 DIAGNOSIS — F40240 Claustrophobia: Secondary | ICD-10-CM | POA: Insufficient documentation

## 2016-05-18 DIAGNOSIS — Z79899 Other long term (current) drug therapy: Secondary | ICD-10-CM | POA: Insufficient documentation

## 2016-05-18 DIAGNOSIS — E7800 Pure hypercholesterolemia, unspecified: Secondary | ICD-10-CM | POA: Insufficient documentation

## 2016-05-18 DIAGNOSIS — I10 Essential (primary) hypertension: Secondary | ICD-10-CM | POA: Insufficient documentation

## 2016-05-18 DIAGNOSIS — C50511 Malignant neoplasm of lower-outer quadrant of right female breast: Secondary | ICD-10-CM

## 2016-05-18 DIAGNOSIS — M25569 Pain in unspecified knee: Secondary | ICD-10-CM

## 2016-05-18 DIAGNOSIS — F319 Bipolar disorder, unspecified: Secondary | ICD-10-CM | POA: Insufficient documentation

## 2016-05-18 DIAGNOSIS — Z87891 Personal history of nicotine dependence: Secondary | ICD-10-CM | POA: Insufficient documentation

## 2016-05-18 DIAGNOSIS — M549 Dorsalgia, unspecified: Secondary | ICD-10-CM | POA: Diagnosis not present

## 2016-05-18 DIAGNOSIS — E1140 Type 2 diabetes mellitus with diabetic neuropathy, unspecified: Secondary | ICD-10-CM | POA: Insufficient documentation

## 2016-05-18 DIAGNOSIS — M1990 Unspecified osteoarthritis, unspecified site: Secondary | ICD-10-CM

## 2016-05-18 DIAGNOSIS — Z7982 Long term (current) use of aspirin: Secondary | ICD-10-CM | POA: Insufficient documentation

## 2016-05-18 DIAGNOSIS — R0600 Dyspnea, unspecified: Secondary | ICD-10-CM

## 2016-05-18 DIAGNOSIS — N951 Menopausal and female climacteric states: Secondary | ICD-10-CM | POA: Diagnosis not present

## 2016-05-18 DIAGNOSIS — E119 Type 2 diabetes mellitus without complications: Secondary | ICD-10-CM

## 2016-05-18 DIAGNOSIS — Z9889 Other specified postprocedural states: Secondary | ICD-10-CM | POA: Insufficient documentation

## 2016-05-18 DIAGNOSIS — F3170 Bipolar disorder, currently in remission, most recent episode unspecified: Secondary | ICD-10-CM

## 2016-05-18 DIAGNOSIS — Z7984 Long term (current) use of oral hypoglycemic drugs: Secondary | ICD-10-CM | POA: Insufficient documentation

## 2016-05-18 NOTE — Telephone Encounter (Signed)
Appointments scheduled per 05/18/16 los. Patient was given a copy of the AVS report and appointment schedule, per 05/18/16 los.

## 2016-05-30 ENCOUNTER — Ambulatory Visit (INDEPENDENT_AMBULATORY_CARE_PROVIDER_SITE_OTHER): Payer: Medicare HMO | Admitting: Family Medicine

## 2016-05-30 VITALS — BP 117/82 | HR 61 | Temp 98.6°F | Ht 67.0 in | Wt 272.4 lb

## 2016-05-30 DIAGNOSIS — E1140 Type 2 diabetes mellitus with diabetic neuropathy, unspecified: Secondary | ICD-10-CM

## 2016-05-30 DIAGNOSIS — Z794 Long term (current) use of insulin: Secondary | ICD-10-CM | POA: Diagnosis not present

## 2016-05-30 DIAGNOSIS — Z23 Encounter for immunization: Secondary | ICD-10-CM

## 2016-05-30 DIAGNOSIS — E1165 Type 2 diabetes mellitus with hyperglycemia: Secondary | ICD-10-CM

## 2016-05-30 DIAGNOSIS — IMO0002 Reserved for concepts with insufficient information to code with codable children: Secondary | ICD-10-CM

## 2016-05-30 LAB — HEMOGLOBIN A1C: HEMOGLOBIN A1C: 8.9 % — AB (ref 4.6–6.5)

## 2016-05-30 NOTE — Progress Notes (Addendum)
[place] at [facility] [address] [place], [place] [address] [phone]  Date:  05/30/2016   Name:  Rhonda Berg   DOB:  1940/11/04   MRN:  [id number]  PCP:  Lamar Blinks, MD    Chief Complaint: Follow-up (Pt here for 2 month f/u to check A1C. Pt checking sugar at home and states glucose has been normal. )   History of Present Illness:  Rhonda Berg is a 76 y.o. very pleasant female patient who presents with the following:  Seen by Dr. Posey Pronto w/ [facility] on 05-09-2016.  Plan from this vist:  1.  Check TSH, vitamin B12, SPEP with IFE for other secondary causes of neuropathy 2.  Start tizanidine 2mg  at bedtime for low back pain 3.  Continue gabapentin 600mg  four times daily 4.  Request records (clinic notes, NCS/EMG, and MRI reports) from her neurologist in [place], Dr. Modena Morrow  Return to clinic in 6 months.  History of DM, neuropathy, bipolar disorder, breast cancer Taking B12, still waiting for increased energy from this.  Not taking Vicodin anymore, that was for surgery.  Energy level about the same.  Zanaflex does not seem to help for her back pain.  Is taking 20 units of Lantus daily.  CBGs have been 110-140.  Checking CBGs BID, in the AM and bedtime.  CBGs have been lower, since she had surgery.    Probably will start radiation 06-11-2016 for her breast cancer.  She had lumpectomy in January She is not on any chemo but is taking an anti-estrogen; aromasin  She is feeling well from a breast cancer standpoint and has healed well from her operation She is a former pt of mine from [employer]- we may not have all of her immunizations and have called to get these records She does report that she got an unknown pneumonia shot several years ago but needs her booster.  Suspect that she has not yet had her prevnar and will update this for her today She declines a flu shot    Patient Active Problem List   Diagnosis Date Noted  . Breast cancer of lower-outer quadrant of right female breast ([place]) 01/30/2016  . Uncontrolled diabetes mellitus type 2 without complications ([place]) [id number]  . Peripheral edema 11/24/2015  . Diabetic polyneuropathy associated with type 2 diabetes mellitus ([place]) 11/24/2015  . Chronic back pain 11/24/2015  . Bipolar affective disorder in remission ([place]) 11/24/2015  . Hyperlipidemia 11/24/2015  . Wheezing 11/24/2015    Past Medical History:  Diagnosis Date  . Arthritis   . Bipolar 1 disorder ([place])   . Cancer ([place])    breast  . Depression   . Diabetes mellitus without complication ([place])   . Diabetic neuropathy ([place])   . Dyspnea   . Hypercholesteremia   . Hypertension   . Peripheral edema   . Proteinuria   . Sleep apnea    has CPAP but doesnt wear it    Past Surgical History:  Procedure Laterality Date  . ABDOMINAL HYSTERECTOMY    . BREAST LUMPECTOMY WITH RADIOACTIVE SEED AND SENTINEL LYMPH NODE BIOPSY Right 04/25/2016   Procedure: RIGHT BREAST LUMPECTOMY WITH RADIOACTIVE SEED AND SENTINEL LYMPH NODE BIOPSY;  Surgeon: Erroll Luna, MD;  Location: [facility];  Service: General;  Laterality: Right;  . COLONOSCOPY    . KNEE SURGERY     bilateral partial knee replacements    Social History  Substance Use Topics  .  Smoking status: Former Smoker    Packs/day: 0.25    Years: 40.00    Quit date: 04/09/2002  . Smokeless tobacco: Never Used  . Alcohol use No    Family History  Problem Relation Age of Onset  . Cancer Mother     breast cancer   . Cancer Sister     breast cancer   . Cancer Maternal Aunt 63    breast cancer   . Cancer Cousin     colon cancer     No Known Allergies  Medication list has been reviewed and updated.  Current Outpatient Prescriptions on File Prior to Visit  Medication Sig Dispense Refill  . albuterol (PROVENTIL HFA;VENTOLIN HFA) 108 (90 Base) MCG/ACT inhaler Inhale 2 puffs into the lungs every 6 (six) hours as needed for  wheezing or shortness of breath. 1 Inhaler 6  . amantadine (SYMMETREL) 100 MG capsule Take 100 mg by mouth 2 (two) times daily.    [name] aspirin 81 MG tablet Take 81 mg by mouth daily.    . BD PEN NEEDLE NANO U/F 32G X 4 MM MISC Check sugar 2-3 times daily    . Blood Glucose Monitoring Suppl MISC Pt uses one touch verio flex. Please dispense test strips #100 and lancets #100. She tests her sugar 2-3x daily 100 each 3  . clonazePAM (KLONOPIN) 1 MG tablet Take 0.5-1 mg by mouth 2 (two) times daily. 0.5 mg in the day and 1 mg at bedtime    . divalproex (DEPAKOTE ER) 250 MG 24 hr tablet Take 1 tablet by mouth 2 (two) times daily.    [name] exemestane (AROMASIN) 25 MG tablet Take 1 tablet (25 mg total) by mouth daily after breakfast. 30 tablet 2  . furosemide (LASIX) 40 MG tablet Take 1 tablet (40 mg total) by mouth daily. 30 tablet 6  . gabapentin (NEURONTIN) 600 MG tablet Take 1 tablet (600 mg total) by mouth 4 (four) times daily. 120 tablet 11  . HYDROcodone-acetaminophen (NORCO) 5-325 MG tablet Take 1 tablet by mouth every 6 (six) hours as needed for moderate pain. 30 tablet 0  . LANTUS SOLOSTAR 100 UNIT/ML Solostar Pen Take 48 units subcutaneously once daily.  Adjust as needed (Patient taking differently: Inject 40 Units into the skin daily before breakfast. Adjust as needed) 15 mL 5  . metFORMIN (GLUCOPHAGE) 1000 MG tablet Take 1 tablet (1,000 mg total) by mouth 2 (two) times daily with a meal. 180 tablet 3  . mirtazapine (REMERON) 15 MG tablet Take 15 mg by mouth at bedtime.    . propranolol (INDERAL) 10 MG tablet Take 1 tablet (10 mg total) by mouth 2 (two) times daily. 60 tablet 3  . QUEtiapine (SEROQUEL) 200 MG tablet Take 1 tablet by mouth at bedtime.    . simvastatin (ZOCOR) 40 MG tablet Take 1 tablet (40 mg total) by mouth every evening. 30 tablet 6  . tiZANidine (ZANAFLEX) 2 MG tablet Take 1 tablet (2 mg total) by mouth at bedtime as needed for muscle spasms (low back pain). 30 tablet 5   No  current facility-administered medications on file prior to visit.     Review of Systems:  As per HPI- otherwise negative.   Physical Examination: Vitals:   05/30/16 0953  BP: 117/82  Pulse: 61  Temp: 98.6 F (37 C)   Vitals:   05/30/16 0953  Weight: 272 lb 6.4 oz (123.6 kg)  Height: 5\' 7"  (1.702 m)   Body mass index is  42.66 kg/m. Ideal Body Weight: Weight in (lb) to have BMI = 25: 159.3  GEN: WDWN, NAD, Non-toxic, A & O x 3, obese, looks well and like her usual self HEENT: Atraumatic, Normocephalic. Neck supple. No masses, No LAD. Ears and Nose: No external deformity. CV: RRR, No M/G/R. No JVD. No thrill. No extra heart sounds. PULM: CTA B, no wheezes, crackles, rhonchi. No retractions. No resp. distress. No accessory muscle use. EXTR: No c/c/e NEURO Normal gait.  PSYCH: Normally interactive. Conversant. Not depressed or anxious appearing.  Calm demeanor.    Assessment and Plan: Uncontrolled type 2 diabetes mellitus with diabetic neuropathy, with long-term current use of insulin ([place]) - Plan: Hemoglobin A1c  Immunization due - Plan: Pneumococcal conjugate vaccine 13-valent IM  Here today to check on her DM Will get an A1c for her today and follow-up with the results Prevnar today  Signed Lamar Blinks, MD  [id number] is certainly better- plan to repeat in 3 months

## 2016-05-30 NOTE — Progress Notes (Signed)
Pre visit review using our clinic review tool, if applicable. No additional management support is needed unless otherwise documented below in the visit note. 

## 2016-05-30 NOTE — Progress Notes (Signed)
Location of Breast Cancer: Right Breast  Histology per Pathology Report: 01/13/16  Diagnosis 1. 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 2. 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  2. Receptor Status: ER(100%), PR (100%), Her2-neu (NEG), Ki-(12%)  04/25/16 Diagnosis 1. Breast, lumpectomy, Right INVASIVE DUCTAL CARCINOMA, GRADE 2, SPANNING 2.7 CM DUCTAL CARCINOMA IN SITU, GRADE 1 ALL MARGINS OF RESECTION ARE NEGATIVE FOR CARCINOMA 2. Lymph node, sentinel, biopsy, Right axillary ONE BENIGN LYMPH NODE (0/1) 3. Breast, excision, Right additional anterior margin BENIGN BREAST TISSUE  Did patient present with symptoms or was this found on screening mammography?: She self palpated the mass.   Past/Anticipated interventions by surgeon, if any: Her breast surgery was postponed due to her uncontrolled hyperglycemia 04/25/16 Procedure: right  breast seed localized lumpectomy with left axillary sentinel lymph node mapping with methylene blue dye  Surgeon: Erroll Luna M.D  Past/Anticipated interventions by medical oncology, if any:  Dr. Burr Medico 05/18/16 PLAN -She will stop exemestane before starting adjuvant breast radiation and resume after she completes radiation. -I reviewed her surgical pathology and Oncotype today. She is appropriate to proceed with radiation. She will follow up with Dr. Isidore Moos on 06/04/16 to further discuss radiation and a possible referral to [facility] where the patient lives. -Lab and f/u in 3 months.  05/18/16 at 9:30  Lymphedema issues, if any:     Pain issues, if any:   SAFETY ISSUES:  Prior radiation? No  Pacemaker/ICD? No  Possible current pregnancy? No  Is the patient on methotrexate? No  Current Complaints / other  details:         Ceclia Koker, Stephani Police, RN 05/30/2016,10:23 AM

## 2016-05-30 NOTE — Patient Instructions (Addendum)
We will check your a1c today and see if we need to adjust your lantus.   Take care and I hope that all goes well with your radiation treatment I am requesting your old immunization records from [facility] - we also have you your pneumonia booster today

## 2016-05-31 ENCOUNTER — Other Ambulatory Visit: Payer: Self-pay | Admitting: Hematology

## 2016-05-31 NOTE — Progress Notes (Signed)
Location of Breast Cancer: Right Breast  Histology per Pathology Report: 01/13/16  Diagnosis 1. 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 2. 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  2. Receptor Status: ER(100%), PR (100%), Her2-neu (NEG), Ki-(12%)  04/25/16 Diagnosis 1. Breast, lumpectomy, Right INVASIVE DUCTAL CARCINOMA, GRADE 2, SPANNING 2.7 CM DUCTAL CARCINOMA IN SITU, GRADE 1 ALL MARGINS OF RESECTION ARE NEGATIVE FOR CARCINOMA 2. Lymph node, sentinel, biopsy, Right axillary ONE BENIGN LYMPH NODE (0/1) 3. Breast, excision, Right additional anterior margin BENIGN BREAST TISSUE  Did patient present with symptoms or was this found on screening mammography?: She self palpated the mass.   Past/Anticipated interventions by surgeon, if any: Her breast surgery was postponed due to her uncontrolled hyperglycemia 04/25/16 Procedure: right  breast seed localized lumpectomy with left axillary sentinel lymph node mapping with methylene blue dye  Surgeon: Erroll Luna M.D  Past/Anticipated interventions by medical oncology, if any:  Dr. Burr Medico 05/18/16 PLAN -She will stop exemestane before starting adjuvant breast radiation and resume after she completes radiation. -I reviewed her surgical pathology and Oncotype today. She is appropriate to proceed with radiation. She will follow up with Dr. Isidore Moos on 06/04/16 to further discuss radiation and a possible referral to [facility] where the patient lives. -Lab and f/u in 3 months.  05/18/16 at 9:30  Lymphedema issues, if any:   no  Pain issues, if any:  no  SAFETY ISSUES:  Prior radiation? No  Pacemaker/ICD? No  Possible current pregnancy? No  Is the patient on methotrexate? No  Current Complaints / other details: Pt  reports incision site is healed, doesn't bother her.  She reports she fell 2 times in the last year, due to getting unbalanced.  She did not hit her head or need medical assistance, just bruising.          [place], Stephani Police, RN 05/31/2016,12:52 PM

## 2016-06-03 ENCOUNTER — Encounter: Payer: Self-pay | Admitting: Family Medicine

## 2016-06-04 ENCOUNTER — Ambulatory Visit
Admission: RE | Admit: 2016-06-04 | Discharge: 2016-06-04 | Disposition: A | Discharge status: Home / Self Care | Payer: Medicare HMO | Source: Ambulatory Visit | Attending: Radiation Oncology | Admitting: Radiation Oncology

## 2016-06-04 ENCOUNTER — Inpatient Hospital Stay
Admission: RE | Admit: 2016-06-04 | Discharge: 2016-06-04 | Disposition: A | Discharge status: Home / Self Care | Payer: Self-pay | Source: Ambulatory Visit | Attending: Radiation Oncology | Admitting: Radiation Oncology

## 2016-06-04 ENCOUNTER — Encounter: Payer: Self-pay | Admitting: Radiation Oncology

## 2016-06-04 ENCOUNTER — Ambulatory Visit: Payer: Medicare HMO

## 2016-06-04 DIAGNOSIS — Z87891 Personal history of nicotine dependence: Secondary | ICD-10-CM | POA: Diagnosis not present

## 2016-06-04 DIAGNOSIS — Z9889 Other specified postprocedural states: Secondary | ICD-10-CM | POA: Diagnosis not present

## 2016-06-04 DIAGNOSIS — E1140 Type 2 diabetes mellitus with diabetic neuropathy, unspecified: Secondary | ICD-10-CM | POA: Diagnosis not present

## 2016-06-04 DIAGNOSIS — C50511 Malignant neoplasm of lower-outer quadrant of right female breast: Secondary | ICD-10-CM

## 2016-06-04 DIAGNOSIS — Z170 Estrogen receptor positive status [ER+]: Principal | ICD-10-CM

## 2016-06-04 DIAGNOSIS — Z7982 Long term (current) use of aspirin: Secondary | ICD-10-CM | POA: Diagnosis not present

## 2016-06-04 DIAGNOSIS — E7800 Pure hypercholesterolemia, unspecified: Secondary | ICD-10-CM | POA: Diagnosis not present

## 2016-06-04 DIAGNOSIS — F40240 Claustrophobia: Secondary | ICD-10-CM | POA: Diagnosis not present

## 2016-06-04 DIAGNOSIS — Z79899 Other long term (current) drug therapy: Secondary | ICD-10-CM | POA: Diagnosis not present

## 2016-06-04 DIAGNOSIS — D0512 Intraductal carcinoma in situ of left breast: Secondary | ICD-10-CM | POA: Diagnosis not present

## 2016-06-04 DIAGNOSIS — I10 Essential (primary) hypertension: Secondary | ICD-10-CM | POA: Diagnosis not present

## 2016-06-04 DIAGNOSIS — Z7984 Long term (current) use of oral hypoglycemic drugs: Secondary | ICD-10-CM | POA: Diagnosis not present

## 2016-06-04 DIAGNOSIS — F319 Bipolar disorder, unspecified: Secondary | ICD-10-CM | POA: Diagnosis not present

## 2016-06-04 NOTE — Progress Notes (Signed)
  Radiation Oncology         [phone] ________________________________  Name: Rhonda Berg MRN: [id number]  Date: 06/04/2016  DOB: 17-May-1940  SIMULATION AND TREATMENT PLANNING NOTE    Outpatient  DIAGNOSIS:     ICD-9-CM ICD-10-CM   1. Malignant neoplasm of lower-outer quadrant of right breast of female, estrogen receptor positive ([place]) 174.5 C50.511    V86.0 Z17.0     NARRATIVE:  The patient was brought to the [facility].  Identity was confirmed.  All relevant records and images related to the planned course of therapy were reviewed.  The patient freely provided informed written consent to proceed with treatment after reviewing the details related to the planned course of therapy. The consent form was witnessed and verified by the simulation staff.    Then, the patient was set-up in a stable reproducible supine position for radiation therapy with her ipsilateral arm over her head, and her upper body secured in a custom-made Vac-lok device.  CT images were obtained.  Surface markings were placed.  The CT images were loaded into the planning software.    TREATMENT PLANNING NOTE: Treatment planning then occurred.  The radiation prescription was entered and confirmed.     A total of 3 medically necessary complex treatment devices were fabricated and supervised by me: 2 fields with MLCs for custom blocks to protect heart, and lungs;  and, a Vac-lok. MORE COMPLEX DEVICES MAY BE MADE IN DOSIMETRY FOR FIELD IN FIELD BEAMS FOR DOSE HOMOGENEITY.  I have requested : 3D Simulation which is medically necessary to give adequate dose to at risk tissues while sparing lungs and heart.  I have requested a DVH of the following structures: lungs, heart, lumpectomy cavity.    The patient will receive 50 Gy in 25 fractions to the right breast with 2 tangential fields.  This will be followed by a boost.  Optical Surface Tracking Plan:  Since intensity modulated radiotherapy (IMRT) and 3D  conformal radiation treatment methods are predicated on accurate and precise positioning for treatment, intrafraction motion monitoring is medically necessary to ensure accurate and safe treatment delivery. The ability to quantify intrafraction motion without excessive ionizing radiation dose can only be performed with optical surface tracking. Accordingly, surface imaging offers the opportunity to obtain 3D measurements of patient position throughout IMRT and 3D treatments without excessive radiation exposure. I am ordering optical surface tracking for this patient's upcoming course of radiotherapy.  ________________________________   Reference:  Ursula Alert, J, et al. Surface imaging-based analysis of intrafraction motion for breast radiotherapy patients.Journal of [address], n. 6, nov. 2014. ISSN [id number].  Available at: <http://www.jacmp.org/index.php/jacmp/article/view/4957>.    -----------------------------------  Eppie Gibson, MD

## 2016-06-04 NOTE — Progress Notes (Signed)
Radiation Oncology         [phone] ________________________________  Name: Rhonda Berg MRN: [id number]  Date: 06/04/2016  DOB: 11-18-1940  Follow-Up Visit Note  Outpatient  CC: Lamar Blinks, MD  Erroll Luna, MD  Diagnosis:      ICD-9-CM ICD-10-CM   1. Malignant neoplasm of lower-outer quadrant of right breast of female, estrogen receptor positive ([place]) 174.5 C50.511    V86.0 Z17.0     Stage I  Multifocal T1cN0M0 Right Breast UOQ Invasive Ductal Carcinoma with DCIS, ER+ / PR+ / Her2-, Grade II  Stage II p T2N0M0  Right Breast UOQ Invasive Ductal Carcinoma with DCIS, ER+ / PR+ / Her2-, Grade II   CHIEF COMPLAINT: Here to discuss management of right breast cancer  Narrative:  The patient returns today for follow-up.     Since consultation, she underwent lumpectomy on 04-25-16 by Dr Brantley Stage.   This revealed INVASIVE DUCTAL CARCINOMA, GRADE 2, SPANNING 2.7 CM with DUCTAL CARCINOMA IN SITU, GRADE 1.   ALL MARGINS OF RESECTION ARE NEGATIVE FOR CARCINOMA.   Lymph node, sentinel, biopsy, Right axillary: ONE BENIGN LYMPH NODE (0/1).    Margins: Invasive carcinoma, distance from closest margin: 0.5 cm from inferior margins DCIS, distance from closest margin: 0.1 mm from lateral, posterior and inferior margin    Estrogen Receptor: 100% Progesterone Receptor: 100% Her2: Negative  She is healing well, denies pain, denies prior RT. She will not receive chemotherapy per med/onc.    al Information            ALLERGIES:  has No Known Allergies.  Meds: Current Outpatient Prescriptions  Medication Sig Dispense Refill  . albuterol (PROVENTIL HFA;VENTOLIN HFA) 108 (90 Base) MCG/ACT inhaler Inhale 2 puffs into the lungs every 6 (six) hours as needed for wheezing or shortness of breath. 1 Inhaler 6  . amantadine (SYMMETREL) 100 MG capsule Take 100 mg by mouth 2 (two) times daily.    [name] aspirin 81 MG tablet Take 81 mg by mouth daily.    . BD PEN NEEDLE NANO U/F 32G X 4 MM  MISC Check sugar 2-3 times daily    . Blood Glucose Monitoring Suppl MISC Pt uses one touch verio flex. Please dispense test strips #100 and lancets #100. She tests her sugar 2-3x daily 100 each 3  . clonazePAM (KLONOPIN) 1 MG tablet Take 0.5-1 mg by mouth 2 (two) times daily. 0.5 mg in the day and 1 mg at bedtime    . cyanocobalamin 2000 MCG tablet Take 2,000 mcg by mouth daily.    . divalproex (DEPAKOTE ER) 250 MG 24 hr tablet Take 1 tablet by mouth 2 (two) times daily.    [name] exemestane (AROMASIN) 25 MG tablet take 1 tablet by mouth AFTER BREAKFAST 30 tablet 2  . furosemide (LASIX) 40 MG tablet Take 1 tablet (40 mg total) by mouth daily. 30 tablet 6  . gabapentin (NEURONTIN) 600 MG tablet Take 1 tablet (600 mg total) by mouth 4 (four) times daily. 120 tablet 11  . HYDROcodone-acetaminophen (NORCO) 5-325 MG tablet Take 1 tablet by mouth every 6 (six) hours as needed for moderate pain. 30 tablet 0  . LANTUS SOLOSTAR 100 UNIT/ML Solostar Pen Take 48 units subcutaneously once daily.  Adjust as needed (Patient taking differently: Inject 40 Units into the skin daily before breakfast. Adjust as needed) 15 mL 5  . metFORMIN (GLUCOPHAGE) 1000 MG tablet Take 1 tablet (1,000 mg total) by mouth 2 (two) times daily with a meal.  180 tablet 3  . mirtazapine (REMERON) 15 MG tablet Take 15 mg by mouth at bedtime.    . propranolol (INDERAL) 10 MG tablet Take 1 tablet (10 mg total) by mouth 2 (two) times daily. 60 tablet 3  . QUEtiapine (SEROQUEL) 200 MG tablet Take 1 tablet by mouth at bedtime.    . simvastatin (ZOCOR) 40 MG tablet Take 1 tablet (40 mg total) by mouth every evening. 30 tablet 6  . tiZANidine (ZANAFLEX) 2 MG tablet Take 1 tablet (2 mg total) by mouth at bedtime as needed for muscle spasms (low back pain). 30 tablet 5   No current facility-administered medications for this encounter.     Physical Findings:  weight is 272 lb 3.2 oz (123.5 kg). Her oral temperature is 98.4 F (36.9 C). Her blood  pressure is 114/76 and her pulse is 73. Her respiration is 16 and oxygen saturation is 93%. .     General: Alert and oriented, in no acute distress Psychiatric: Judgment and insight are intact. Affect is appropriate. Breast exam reveals that her right breast is healing well post surgery  Lab Findings: Lab Results  Component Value Date   WBC 5.8 04/18/2016   HGB 14.4 04/18/2016   HCT 45.1 04/18/2016   MCV 89.0 04/18/2016   PLT 185 04/18/2016       Radiographic Findings: Mm Breast Surgical Specimen  Result Date: 04/25/2016 CLINICAL DATA:  Evaluate specimen EXAM: SPECIMEN RADIOGRAPH OF THE RIGHT BREAST COMPARISON:  Previous exam(s). FINDINGS: Status post excision of the right breast. The radioactive seed and both biopsy markers clip are present, completely intact, and were marked for pathology. IMPRESSION: Specimen radiograph of the right breast. Electronically Signed   By: Dorise Bullion III M.D   On: 04/25/2016 10:15    Impression/Plan: right breast cancer, T2N0M0 We discussed adjuvant radiotherapy today.  I recommend radiotherapy tot he right breast in order to reduce locoregional recurrence risk by 2/3.  The risks, benefits and side effects of this treatment were discussed in detail.  She understands that radiotherapy is associated with skin irritation and fatigue in the acute setting. Late effects can include cosmetic changes and rare injury to internal organs.   She is enthusiastic about proceeding with treatment. A consent form has been signed and placed in her chart.  We discussed treatment in [place] closer to her home but she desires treatment here.  Proceed with simulation today.  A total of 3 medically necessary complex treatment devices were fabricated and supervised by me: 2 fields with MLCs for custom blocks to protect heart, and lungs;  and, a Vac-lok. MORE COMPLEX DEVICES MAY BE MADE IN DOSIMETRY FOR FIELD IN FIELD BEAMS FOR DOSE HOMOGENEITY.  I have requested : 3D  Simulation which is medically necessary to give adequate dose to at risk tissues while sparing lungs and heart.  I have requested a DVH of the following structures: lungs, heart, lumpectomy cavity.     The patient will receive 50 Gy in 25 fractions to the right breast with at least 2 tangential fields.  This will be followed by a boost  I spent 20 minutes face to face with the patient and more than 50% of that time was spent in counseling and/or coordination of care. _____________________________________   Eppie Gibson, MD

## 2016-06-08 DIAGNOSIS — E7800 Pure hypercholesterolemia, unspecified: Secondary | ICD-10-CM | POA: Diagnosis not present

## 2016-06-08 DIAGNOSIS — Z87891 Personal history of nicotine dependence: Secondary | ICD-10-CM | POA: Diagnosis not present

## 2016-06-08 DIAGNOSIS — F319 Bipolar disorder, unspecified: Secondary | ICD-10-CM | POA: Diagnosis not present

## 2016-06-08 DIAGNOSIS — Z9889 Other specified postprocedural states: Secondary | ICD-10-CM | POA: Diagnosis not present

## 2016-06-08 DIAGNOSIS — I10 Essential (primary) hypertension: Secondary | ICD-10-CM | POA: Diagnosis not present

## 2016-06-08 DIAGNOSIS — E1140 Type 2 diabetes mellitus with diabetic neuropathy, unspecified: Secondary | ICD-10-CM | POA: Diagnosis not present

## 2016-06-08 DIAGNOSIS — C50511 Malignant neoplasm of lower-outer quadrant of right female breast: Secondary | ICD-10-CM | POA: Diagnosis not present

## 2016-06-08 DIAGNOSIS — Z170 Estrogen receptor positive status [ER+]: Secondary | ICD-10-CM | POA: Diagnosis not present

## 2016-06-08 DIAGNOSIS — F40240 Claustrophobia: Secondary | ICD-10-CM | POA: Diagnosis not present

## 2016-06-11 ENCOUNTER — Ambulatory Visit: Payer: Medicare HMO | Admitting: Radiation Oncology

## 2016-06-12 ENCOUNTER — Ambulatory Visit
Admission: RE | Admit: 2016-06-12 | Discharge: 2016-06-12 | Disposition: A | Discharge status: Home / Self Care | Payer: Medicare HMO | Source: Ambulatory Visit | Attending: Radiation Oncology | Admitting: Radiation Oncology

## 2016-06-12 DIAGNOSIS — E1140 Type 2 diabetes mellitus with diabetic neuropathy, unspecified: Secondary | ICD-10-CM | POA: Diagnosis not present

## 2016-06-12 DIAGNOSIS — F40240 Claustrophobia: Secondary | ICD-10-CM | POA: Diagnosis not present

## 2016-06-12 DIAGNOSIS — Z170 Estrogen receptor positive status [ER+]: Secondary | ICD-10-CM | POA: Diagnosis not present

## 2016-06-12 DIAGNOSIS — Z87891 Personal history of nicotine dependence: Secondary | ICD-10-CM | POA: Diagnosis not present

## 2016-06-12 DIAGNOSIS — Z9889 Other specified postprocedural states: Secondary | ICD-10-CM | POA: Diagnosis not present

## 2016-06-12 DIAGNOSIS — F319 Bipolar disorder, unspecified: Secondary | ICD-10-CM | POA: Diagnosis not present

## 2016-06-12 DIAGNOSIS — I10 Essential (primary) hypertension: Secondary | ICD-10-CM | POA: Diagnosis not present

## 2016-06-12 DIAGNOSIS — E7800 Pure hypercholesterolemia, unspecified: Secondary | ICD-10-CM | POA: Diagnosis not present

## 2016-06-12 DIAGNOSIS — C50511 Malignant neoplasm of lower-outer quadrant of right female breast: Secondary | ICD-10-CM | POA: Diagnosis not present

## 2016-06-13 ENCOUNTER — Ambulatory Visit
Admission: RE | Admit: 2016-06-13 | Discharge: 2016-06-13 | Disposition: A | Discharge status: Home / Self Care | Payer: Medicare HMO | Source: Ambulatory Visit | Attending: Radiation Oncology | Admitting: Radiation Oncology

## 2016-06-13 DIAGNOSIS — E1140 Type 2 diabetes mellitus with diabetic neuropathy, unspecified: Secondary | ICD-10-CM | POA: Diagnosis not present

## 2016-06-13 DIAGNOSIS — F319 Bipolar disorder, unspecified: Secondary | ICD-10-CM | POA: Diagnosis not present

## 2016-06-13 DIAGNOSIS — F40240 Claustrophobia: Secondary | ICD-10-CM | POA: Diagnosis not present

## 2016-06-13 DIAGNOSIS — Z9889 Other specified postprocedural states: Secondary | ICD-10-CM | POA: Diagnosis not present

## 2016-06-13 DIAGNOSIS — E7800 Pure hypercholesterolemia, unspecified: Secondary | ICD-10-CM | POA: Diagnosis not present

## 2016-06-13 DIAGNOSIS — I10 Essential (primary) hypertension: Secondary | ICD-10-CM | POA: Diagnosis not present

## 2016-06-13 DIAGNOSIS — Z170 Estrogen receptor positive status [ER+]: Principal | ICD-10-CM

## 2016-06-13 DIAGNOSIS — C50511 Malignant neoplasm of lower-outer quadrant of right female breast: Secondary | ICD-10-CM

## 2016-06-13 DIAGNOSIS — Z87891 Personal history of nicotine dependence: Secondary | ICD-10-CM | POA: Diagnosis not present

## 2016-06-13 MED ORDER — ALRA NON-METALLIC DEODORANT (RAD-ONC)
1.0000 "application " | Freq: Once | TOPICAL | Status: AC
  Administered 2016-06-13: 1 via TOPICAL

## 2016-06-13 MED ORDER — RADIAPLEXRX EX GEL
Freq: Once | CUTANEOUS | Status: AC
  Administered 2016-06-13: 18:00:00 via TOPICAL

## 2016-06-13 NOTE — Progress Notes (Signed)
Pt here for patient teaching.  Pt given Radiation and You booklet, skin care instructions, Alra deodorant and Radiaplex gel.  Reviewed areas of pertinence such as fatigue, hair loss, skin changes, breast tenderness and breast swelling . Pt able to give teach back of to pat skin, use unscented/gentle soap and drink plenty of water,apply Radiaplex bid, avoid applying anything to skin within 4 hours of treatment, avoid wearing an under wire bra and to use an electric razor if they must shave. Pt verbalizes understanding of information given and will contact nursing with any questions or concerns.     Http://rtanswers.org/treatmentinformation/whattoexpect/index      

## 2016-06-14 ENCOUNTER — Ambulatory Visit
Admission: RE | Admit: 2016-06-14 | Discharge: 2016-06-14 | Disposition: A | Discharge status: Home / Self Care | Payer: Medicare HMO | Source: Ambulatory Visit | Attending: Radiation Oncology | Admitting: Radiation Oncology

## 2016-06-14 DIAGNOSIS — F319 Bipolar disorder, unspecified: Secondary | ICD-10-CM | POA: Diagnosis not present

## 2016-06-14 DIAGNOSIS — F40240 Claustrophobia: Secondary | ICD-10-CM | POA: Diagnosis not present

## 2016-06-14 DIAGNOSIS — Z9889 Other specified postprocedural states: Secondary | ICD-10-CM | POA: Diagnosis not present

## 2016-06-14 DIAGNOSIS — Z170 Estrogen receptor positive status [ER+]: Secondary | ICD-10-CM | POA: Diagnosis not present

## 2016-06-14 DIAGNOSIS — Z87891 Personal history of nicotine dependence: Secondary | ICD-10-CM | POA: Diagnosis not present

## 2016-06-14 DIAGNOSIS — E7800 Pure hypercholesterolemia, unspecified: Secondary | ICD-10-CM | POA: Diagnosis not present

## 2016-06-14 DIAGNOSIS — I10 Essential (primary) hypertension: Secondary | ICD-10-CM | POA: Diagnosis not present

## 2016-06-14 DIAGNOSIS — E1140 Type 2 diabetes mellitus with diabetic neuropathy, unspecified: Secondary | ICD-10-CM | POA: Diagnosis not present

## 2016-06-14 DIAGNOSIS — C50511 Malignant neoplasm of lower-outer quadrant of right female breast: Secondary | ICD-10-CM | POA: Diagnosis not present

## 2016-06-15 ENCOUNTER — Ambulatory Visit
Admission: RE | Admit: 2016-06-15 | Discharge: 2016-06-15 | Disposition: A | Discharge status: Home / Self Care | Payer: Medicare HMO | Source: Ambulatory Visit | Attending: Radiation Oncology | Admitting: Radiation Oncology

## 2016-06-15 DIAGNOSIS — E7800 Pure hypercholesterolemia, unspecified: Secondary | ICD-10-CM | POA: Diagnosis not present

## 2016-06-15 DIAGNOSIS — I10 Essential (primary) hypertension: Secondary | ICD-10-CM | POA: Diagnosis not present

## 2016-06-15 DIAGNOSIS — Z9889 Other specified postprocedural states: Secondary | ICD-10-CM | POA: Diagnosis not present

## 2016-06-15 DIAGNOSIS — Z170 Estrogen receptor positive status [ER+]: Secondary | ICD-10-CM | POA: Diagnosis not present

## 2016-06-15 DIAGNOSIS — F319 Bipolar disorder, unspecified: Secondary | ICD-10-CM | POA: Diagnosis not present

## 2016-06-15 DIAGNOSIS — Z87891 Personal history of nicotine dependence: Secondary | ICD-10-CM | POA: Diagnosis not present

## 2016-06-15 DIAGNOSIS — E1140 Type 2 diabetes mellitus with diabetic neuropathy, unspecified: Secondary | ICD-10-CM | POA: Diagnosis not present

## 2016-06-15 DIAGNOSIS — C50511 Malignant neoplasm of lower-outer quadrant of right female breast: Secondary | ICD-10-CM | POA: Diagnosis not present

## 2016-06-15 DIAGNOSIS — F40240 Claustrophobia: Secondary | ICD-10-CM | POA: Diagnosis not present

## 2016-06-18 ENCOUNTER — Ambulatory Visit
Admission: RE | Admit: 2016-06-18 | Discharge: 2016-06-18 | Disposition: A | Discharge status: Home / Self Care | Payer: Medicare HMO | Source: Ambulatory Visit | Attending: Radiation Oncology | Admitting: Radiation Oncology

## 2016-06-18 ENCOUNTER — Encounter: Payer: Self-pay | Admitting: Radiation Oncology

## 2016-06-18 VITALS — BP 95/75 | HR 65 | Temp 99.0°F | Resp 18 | Wt 272.4 lb

## 2016-06-18 DIAGNOSIS — E1140 Type 2 diabetes mellitus with diabetic neuropathy, unspecified: Secondary | ICD-10-CM | POA: Diagnosis not present

## 2016-06-18 DIAGNOSIS — F40240 Claustrophobia: Secondary | ICD-10-CM | POA: Diagnosis not present

## 2016-06-18 DIAGNOSIS — C50511 Malignant neoplasm of lower-outer quadrant of right female breast: Secondary | ICD-10-CM

## 2016-06-18 DIAGNOSIS — E7800 Pure hypercholesterolemia, unspecified: Secondary | ICD-10-CM | POA: Diagnosis not present

## 2016-06-18 DIAGNOSIS — I10 Essential (primary) hypertension: Secondary | ICD-10-CM | POA: Diagnosis not present

## 2016-06-18 DIAGNOSIS — F319 Bipolar disorder, unspecified: Secondary | ICD-10-CM | POA: Diagnosis not present

## 2016-06-18 DIAGNOSIS — Z87891 Personal history of nicotine dependence: Secondary | ICD-10-CM | POA: Diagnosis not present

## 2016-06-18 DIAGNOSIS — Z170 Estrogen receptor positive status [ER+]: Secondary | ICD-10-CM | POA: Diagnosis not present

## 2016-06-18 DIAGNOSIS — Z9889 Other specified postprocedural states: Secondary | ICD-10-CM | POA: Diagnosis not present

## 2016-06-18 NOTE — Progress Notes (Signed)
Rhonda Berg completed 5th fraction to right breast.  Patient denies any pain but does report fatigue.  Reports taking more naps throughout day.  Mild Hyperpigmentation to right breast.  Patient states she uses radiaplex twice daily to area.   Vitals:   06/18/16 1523  BP: 95/75  Pulse: 65  Resp: 18  Temp: 99 F (37.2 C)  TempSrc: Oral  SpO2: 95%  Weight: 272 lb 6.4 oz (123.6 kg)    Wt Readings from Last 3 Encounters:  06/18/16 272 lb 6.4 oz (123.6 kg)  06/04/16 272 lb 3.2 oz (123.5 kg)  05/30/16 272 lb 6.4 oz (123.6 kg)

## 2016-06-18 NOTE — Progress Notes (Signed)
   Weekly Management Note:  Outpatient    ICD-9-CM ICD-10-CM   1. Malignant neoplasm of lower-outer quadrant of right breast of female, estrogen receptor positive ([facility]) 174.5 C50.511    V86.0 Z17.0     Current Dose:  10 Gy  Projected Dose: 60 Gy   Narrative:  The patient presents for routine under treatment assessment.  CBCT/MVCT images/Port film x-rays were reviewed.  The chart was checked. Doing well.   Physical Findings:  weight is 272 lb 6.4 oz (123.6 kg). Her oral temperature is 99 F (37.2 C). Her blood pressure is 95/75 and her pulse is 65. Her respiration is 18 and oxygen saturation is 95%.   Wt Readings from Last 3 Encounters:  06/18/16 272 lb 6.4 oz (123.6 kg)  06/04/16 272 lb 3.2 oz (123.5 kg)  05/30/16 272 lb 6.4 oz (123.6 kg)   NAD, mild erythema of right breast  Impression:  The patient is tolerating radiotherapy.  Plan:  Continue radiotherapy as planned. Patient instructed to apply Radiplex to intact skin in treatment fields.      ________________________________   Eppie Gibson, M.D.

## 2016-06-19 ENCOUNTER — Ambulatory Visit
Admission: RE | Admit: 2016-06-19 | Discharge: 2016-06-19 | Disposition: A | Discharge status: Home / Self Care | Payer: Medicare HMO | Source: Ambulatory Visit | Attending: Radiation Oncology | Admitting: Radiation Oncology

## 2016-06-19 DIAGNOSIS — Z170 Estrogen receptor positive status [ER+]: Secondary | ICD-10-CM | POA: Diagnosis not present

## 2016-06-19 DIAGNOSIS — F40240 Claustrophobia: Secondary | ICD-10-CM | POA: Diagnosis not present

## 2016-06-19 DIAGNOSIS — Z9889 Other specified postprocedural states: Secondary | ICD-10-CM | POA: Diagnosis not present

## 2016-06-19 DIAGNOSIS — C50511 Malignant neoplasm of lower-outer quadrant of right female breast: Secondary | ICD-10-CM | POA: Diagnosis not present

## 2016-06-19 DIAGNOSIS — E7800 Pure hypercholesterolemia, unspecified: Secondary | ICD-10-CM | POA: Diagnosis not present

## 2016-06-19 DIAGNOSIS — I10 Essential (primary) hypertension: Secondary | ICD-10-CM | POA: Diagnosis not present

## 2016-06-19 DIAGNOSIS — F319 Bipolar disorder, unspecified: Secondary | ICD-10-CM | POA: Diagnosis not present

## 2016-06-19 DIAGNOSIS — E1140 Type 2 diabetes mellitus with diabetic neuropathy, unspecified: Secondary | ICD-10-CM | POA: Diagnosis not present

## 2016-06-19 DIAGNOSIS — Z87891 Personal history of nicotine dependence: Secondary | ICD-10-CM | POA: Diagnosis not present

## 2016-06-20 ENCOUNTER — Ambulatory Visit
Admission: RE | Admit: 2016-06-20 | Discharge: 2016-06-20 | Disposition: A | Discharge status: Home / Self Care | Payer: Medicare HMO | Source: Ambulatory Visit | Attending: Radiation Oncology | Admitting: Radiation Oncology

## 2016-06-20 DIAGNOSIS — Z87891 Personal history of nicotine dependence: Secondary | ICD-10-CM | POA: Diagnosis not present

## 2016-06-20 DIAGNOSIS — E7800 Pure hypercholesterolemia, unspecified: Secondary | ICD-10-CM | POA: Diagnosis not present

## 2016-06-20 DIAGNOSIS — F319 Bipolar disorder, unspecified: Secondary | ICD-10-CM | POA: Diagnosis not present

## 2016-06-20 DIAGNOSIS — E1140 Type 2 diabetes mellitus with diabetic neuropathy, unspecified: Secondary | ICD-10-CM | POA: Diagnosis not present

## 2016-06-20 DIAGNOSIS — I10 Essential (primary) hypertension: Secondary | ICD-10-CM | POA: Diagnosis not present

## 2016-06-20 DIAGNOSIS — Z9889 Other specified postprocedural states: Secondary | ICD-10-CM | POA: Diagnosis not present

## 2016-06-20 DIAGNOSIS — Z170 Estrogen receptor positive status [ER+]: Secondary | ICD-10-CM | POA: Diagnosis not present

## 2016-06-20 DIAGNOSIS — F40240 Claustrophobia: Secondary | ICD-10-CM | POA: Diagnosis not present

## 2016-06-20 DIAGNOSIS — C50511 Malignant neoplasm of lower-outer quadrant of right female breast: Secondary | ICD-10-CM | POA: Diagnosis not present

## 2016-06-21 ENCOUNTER — Ambulatory Visit
Admission: RE | Admit: 2016-06-21 | Discharge: 2016-06-21 | Disposition: A | Discharge status: Home / Self Care | Payer: Medicare HMO | Source: Ambulatory Visit | Attending: Radiation Oncology | Admitting: Radiation Oncology

## 2016-06-21 DIAGNOSIS — Z87891 Personal history of nicotine dependence: Secondary | ICD-10-CM | POA: Diagnosis not present

## 2016-06-21 DIAGNOSIS — F319 Bipolar disorder, unspecified: Secondary | ICD-10-CM | POA: Diagnosis not present

## 2016-06-21 DIAGNOSIS — C50511 Malignant neoplasm of lower-outer quadrant of right female breast: Secondary | ICD-10-CM | POA: Diagnosis not present

## 2016-06-21 DIAGNOSIS — F330 Major depressive disorder, recurrent, mild: Secondary | ICD-10-CM | POA: Diagnosis not present

## 2016-06-21 DIAGNOSIS — E7800 Pure hypercholesterolemia, unspecified: Secondary | ICD-10-CM | POA: Diagnosis not present

## 2016-06-21 DIAGNOSIS — I10 Essential (primary) hypertension: Secondary | ICD-10-CM | POA: Diagnosis not present

## 2016-06-21 DIAGNOSIS — F40240 Claustrophobia: Secondary | ICD-10-CM | POA: Diagnosis not present

## 2016-06-21 DIAGNOSIS — Z170 Estrogen receptor positive status [ER+]: Secondary | ICD-10-CM | POA: Diagnosis not present

## 2016-06-21 DIAGNOSIS — E1140 Type 2 diabetes mellitus with diabetic neuropathy, unspecified: Secondary | ICD-10-CM | POA: Diagnosis not present

## 2016-06-21 DIAGNOSIS — Z9889 Other specified postprocedural states: Secondary | ICD-10-CM | POA: Diagnosis not present

## 2016-06-22 ENCOUNTER — Ambulatory Visit
Admission: RE | Admit: 2016-06-22 | Discharge: 2016-06-22 | Disposition: A | Discharge status: Home / Self Care | Payer: Medicare HMO | Source: Ambulatory Visit | Attending: Radiation Oncology | Admitting: Radiation Oncology

## 2016-06-22 DIAGNOSIS — F319 Bipolar disorder, unspecified: Secondary | ICD-10-CM | POA: Diagnosis not present

## 2016-06-22 DIAGNOSIS — I10 Essential (primary) hypertension: Secondary | ICD-10-CM | POA: Diagnosis not present

## 2016-06-22 DIAGNOSIS — F40240 Claustrophobia: Secondary | ICD-10-CM | POA: Diagnosis not present

## 2016-06-22 DIAGNOSIS — Z170 Estrogen receptor positive status [ER+]: Secondary | ICD-10-CM | POA: Diagnosis not present

## 2016-06-22 DIAGNOSIS — E7800 Pure hypercholesterolemia, unspecified: Secondary | ICD-10-CM | POA: Diagnosis not present

## 2016-06-22 DIAGNOSIS — E1140 Type 2 diabetes mellitus with diabetic neuropathy, unspecified: Secondary | ICD-10-CM | POA: Diagnosis not present

## 2016-06-22 DIAGNOSIS — Z87891 Personal history of nicotine dependence: Secondary | ICD-10-CM | POA: Diagnosis not present

## 2016-06-22 DIAGNOSIS — Z9889 Other specified postprocedural states: Secondary | ICD-10-CM | POA: Diagnosis not present

## 2016-06-22 DIAGNOSIS — C50511 Malignant neoplasm of lower-outer quadrant of right female breast: Secondary | ICD-10-CM | POA: Diagnosis not present

## 2016-06-25 ENCOUNTER — Ambulatory Visit
Admission: RE | Admit: 2016-06-25 | Discharge: 2016-06-25 | Disposition: A | Discharge status: Home / Self Care | Payer: Medicare HMO | Source: Ambulatory Visit | Attending: Radiation Oncology | Admitting: Radiation Oncology

## 2016-06-25 ENCOUNTER — Encounter: Payer: Self-pay | Admitting: Radiation Oncology

## 2016-06-25 VITALS — BP 101/74 | HR 88 | Temp 98.1°F | Ht 67.0 in | Wt 269.8 lb

## 2016-06-25 DIAGNOSIS — F40240 Claustrophobia: Secondary | ICD-10-CM | POA: Diagnosis not present

## 2016-06-25 DIAGNOSIS — Z170 Estrogen receptor positive status [ER+]: Secondary | ICD-10-CM | POA: Diagnosis not present

## 2016-06-25 DIAGNOSIS — F319 Bipolar disorder, unspecified: Secondary | ICD-10-CM | POA: Diagnosis not present

## 2016-06-25 DIAGNOSIS — Z9889 Other specified postprocedural states: Secondary | ICD-10-CM | POA: Diagnosis not present

## 2016-06-25 DIAGNOSIS — E1140 Type 2 diabetes mellitus with diabetic neuropathy, unspecified: Secondary | ICD-10-CM | POA: Diagnosis not present

## 2016-06-25 DIAGNOSIS — E7800 Pure hypercholesterolemia, unspecified: Secondary | ICD-10-CM | POA: Diagnosis not present

## 2016-06-25 DIAGNOSIS — Z87891 Personal history of nicotine dependence: Secondary | ICD-10-CM | POA: Diagnosis not present

## 2016-06-25 DIAGNOSIS — C50511 Malignant neoplasm of lower-outer quadrant of right female breast: Secondary | ICD-10-CM | POA: Diagnosis not present

## 2016-06-25 DIAGNOSIS — I10 Essential (primary) hypertension: Secondary | ICD-10-CM | POA: Diagnosis not present

## 2016-06-25 NOTE — Progress Notes (Signed)
   Weekly Management Note:  Outpatient    ICD-9-CM ICD-10-CM   1. Malignant neoplasm of lower-outer quadrant of right breast of female, estrogen receptor positive (HCC) 174.5 C50.511    V86.0 Z17.0     Current Dose:  20 Gy  Projected Dose: 60 Gy   Narrative:  The patient presents for routine under treatment assessment.  CBCT/MVCT images/Port film x-rays were reviewed.  The chart was checked. Doing well.   Physical Findings:  height is 5\' 7"  (1.702 m) and weight is 269 lb 12.8 oz (122.4 kg). Her temperature is 98.1 F (36.7 C). Her blood pressure is 101/74 and her pulse is 88. Her oxygen saturation is 100%.   Wt Readings from Last 3 Encounters:  06/25/16 269 lb 12.8 oz (122.4 kg)  06/18/16 272 lb 6.4 oz (123.6 kg)  06/04/16 272 lb 3.2 oz (123.5 kg)   NAD, mild erythema of right breast; skin intact  Impression:  The patient is tolerating radiotherapy.  Plan:  Continue radiotherapy as planned. Patient instructed to apply Radiplex to intact skin in treatment fields.    ________________________________   Eppie Gibson, M.D.

## 2016-06-25 NOTE — Progress Notes (Signed)
Rhonda Berg presents for her 10th fraction of radiation to her Right Breast. She denies current Right Breast pain, but reports occasional sharp pains to this area. She has some fatigue, and will rest during the day. Her Right Breast is slightly hyperpigmented. She is using Radiplex twice daily.   BP 101/74   Pulse 88   Temp 98.1 F (36.7 C)   Ht 5\' 7"  (1.702 m)   Wt 269 lb 12.8 oz (122.4 kg)   SpO2 100% Comment: room air  BMI 42.26 kg/m    Wt Readings from Last 3 Encounters:  06/25/16 269 lb 12.8 oz (122.4 kg)  06/18/16 272 lb 6.4 oz (123.6 kg)  06/04/16 272 lb 3.2 oz (123.5 kg)

## 2016-06-26 ENCOUNTER — Ambulatory Visit
Admission: RE | Admit: 2016-06-26 | Discharge: 2016-06-26 | Disposition: A | Discharge status: Home / Self Care | Payer: Medicare HMO | Source: Ambulatory Visit | Attending: Radiation Oncology | Admitting: Radiation Oncology

## 2016-06-26 DIAGNOSIS — Z87891 Personal history of nicotine dependence: Secondary | ICD-10-CM | POA: Diagnosis not present

## 2016-06-26 DIAGNOSIS — E7800 Pure hypercholesterolemia, unspecified: Secondary | ICD-10-CM | POA: Diagnosis not present

## 2016-06-26 DIAGNOSIS — E1140 Type 2 diabetes mellitus with diabetic neuropathy, unspecified: Secondary | ICD-10-CM | POA: Diagnosis not present

## 2016-06-26 DIAGNOSIS — Z9889 Other specified postprocedural states: Secondary | ICD-10-CM | POA: Diagnosis not present

## 2016-06-26 DIAGNOSIS — F319 Bipolar disorder, unspecified: Secondary | ICD-10-CM | POA: Diagnosis not present

## 2016-06-26 DIAGNOSIS — I10 Essential (primary) hypertension: Secondary | ICD-10-CM | POA: Diagnosis not present

## 2016-06-26 DIAGNOSIS — Z170 Estrogen receptor positive status [ER+]: Secondary | ICD-10-CM | POA: Diagnosis not present

## 2016-06-26 DIAGNOSIS — F40240 Claustrophobia: Secondary | ICD-10-CM | POA: Diagnosis not present

## 2016-06-26 DIAGNOSIS — C50511 Malignant neoplasm of lower-outer quadrant of right female breast: Secondary | ICD-10-CM | POA: Diagnosis not present

## 2016-06-26 NOTE — Addendum Note (Signed)
Encounter addended by: Ernst Spell, RN on: 06/26/2016  6:14 PM<BR>    Actions taken: Order list changed, Order Reconciliation Section accessed, Home Medications modified

## 2016-06-27 ENCOUNTER — Ambulatory Visit
Admission: RE | Admit: 2016-06-27 | Discharge: 2016-06-27 | Disposition: A | Discharge status: Home / Self Care | Payer: Medicare HMO | Source: Ambulatory Visit | Attending: Radiation Oncology | Admitting: Radiation Oncology

## 2016-06-27 DIAGNOSIS — F40240 Claustrophobia: Secondary | ICD-10-CM | POA: Diagnosis not present

## 2016-06-27 DIAGNOSIS — E7800 Pure hypercholesterolemia, unspecified: Secondary | ICD-10-CM | POA: Diagnosis not present

## 2016-06-27 DIAGNOSIS — Z87891 Personal history of nicotine dependence: Secondary | ICD-10-CM | POA: Diagnosis not present

## 2016-06-27 DIAGNOSIS — C50511 Malignant neoplasm of lower-outer quadrant of right female breast: Secondary | ICD-10-CM | POA: Diagnosis not present

## 2016-06-27 DIAGNOSIS — Z170 Estrogen receptor positive status [ER+]: Secondary | ICD-10-CM | POA: Diagnosis not present

## 2016-06-27 DIAGNOSIS — I10 Essential (primary) hypertension: Secondary | ICD-10-CM | POA: Diagnosis not present

## 2016-06-27 DIAGNOSIS — Z9889 Other specified postprocedural states: Secondary | ICD-10-CM | POA: Diagnosis not present

## 2016-06-27 DIAGNOSIS — F319 Bipolar disorder, unspecified: Secondary | ICD-10-CM | POA: Diagnosis not present

## 2016-06-27 DIAGNOSIS — E1140 Type 2 diabetes mellitus with diabetic neuropathy, unspecified: Secondary | ICD-10-CM | POA: Diagnosis not present

## 2016-06-28 ENCOUNTER — Ambulatory Visit
Admission: RE | Admit: 2016-06-28 | Discharge: 2016-06-28 | Disposition: A | Discharge status: Home / Self Care | Payer: Medicare HMO | Source: Ambulatory Visit | Attending: Radiation Oncology | Admitting: Radiation Oncology

## 2016-06-28 DIAGNOSIS — Z170 Estrogen receptor positive status [ER+]: Secondary | ICD-10-CM | POA: Diagnosis not present

## 2016-06-28 DIAGNOSIS — F40240 Claustrophobia: Secondary | ICD-10-CM | POA: Diagnosis not present

## 2016-06-28 DIAGNOSIS — E1140 Type 2 diabetes mellitus with diabetic neuropathy, unspecified: Secondary | ICD-10-CM | POA: Diagnosis not present

## 2016-06-28 DIAGNOSIS — I10 Essential (primary) hypertension: Secondary | ICD-10-CM | POA: Diagnosis not present

## 2016-06-28 DIAGNOSIS — F319 Bipolar disorder, unspecified: Secondary | ICD-10-CM | POA: Diagnosis not present

## 2016-06-28 DIAGNOSIS — C50511 Malignant neoplasm of lower-outer quadrant of right female breast: Secondary | ICD-10-CM | POA: Diagnosis not present

## 2016-06-28 DIAGNOSIS — Z87891 Personal history of nicotine dependence: Secondary | ICD-10-CM | POA: Diagnosis not present

## 2016-06-28 DIAGNOSIS — Z9889 Other specified postprocedural states: Secondary | ICD-10-CM | POA: Diagnosis not present

## 2016-06-28 DIAGNOSIS — E7800 Pure hypercholesterolemia, unspecified: Secondary | ICD-10-CM | POA: Diagnosis not present

## 2016-06-29 ENCOUNTER — Ambulatory Visit
Admission: RE | Admit: 2016-06-29 | Discharge: 2016-06-29 | Disposition: A | Discharge status: Home / Self Care | Payer: Medicare HMO | Source: Ambulatory Visit | Attending: Radiation Oncology | Admitting: Radiation Oncology

## 2016-06-29 DIAGNOSIS — F40240 Claustrophobia: Secondary | ICD-10-CM | POA: Diagnosis not present

## 2016-06-29 DIAGNOSIS — C50511 Malignant neoplasm of lower-outer quadrant of right female breast: Secondary | ICD-10-CM | POA: Diagnosis not present

## 2016-06-29 DIAGNOSIS — I10 Essential (primary) hypertension: Secondary | ICD-10-CM | POA: Diagnosis not present

## 2016-06-29 DIAGNOSIS — Z9889 Other specified postprocedural states: Secondary | ICD-10-CM | POA: Diagnosis not present

## 2016-06-29 DIAGNOSIS — F319 Bipolar disorder, unspecified: Secondary | ICD-10-CM | POA: Diagnosis not present

## 2016-06-29 DIAGNOSIS — Z170 Estrogen receptor positive status [ER+]: Secondary | ICD-10-CM | POA: Diagnosis not present

## 2016-06-29 DIAGNOSIS — E1140 Type 2 diabetes mellitus with diabetic neuropathy, unspecified: Secondary | ICD-10-CM | POA: Diagnosis not present

## 2016-06-29 DIAGNOSIS — E7800 Pure hypercholesterolemia, unspecified: Secondary | ICD-10-CM | POA: Diagnosis not present

## 2016-06-29 DIAGNOSIS — Z87891 Personal history of nicotine dependence: Secondary | ICD-10-CM | POA: Diagnosis not present

## 2016-07-02 ENCOUNTER — Ambulatory Visit
Admission: RE | Admit: 2016-07-02 | Discharge: 2016-07-02 | Disposition: A | Discharge status: Home / Self Care | Payer: Medicare HMO | Source: Ambulatory Visit | Attending: Radiation Oncology | Admitting: Radiation Oncology

## 2016-07-02 DIAGNOSIS — Z87891 Personal history of nicotine dependence: Secondary | ICD-10-CM | POA: Diagnosis not present

## 2016-07-02 DIAGNOSIS — I10 Essential (primary) hypertension: Secondary | ICD-10-CM | POA: Diagnosis not present

## 2016-07-02 DIAGNOSIS — C50511 Malignant neoplasm of lower-outer quadrant of right female breast: Secondary | ICD-10-CM | POA: Diagnosis not present

## 2016-07-02 DIAGNOSIS — F319 Bipolar disorder, unspecified: Secondary | ICD-10-CM | POA: Diagnosis not present

## 2016-07-02 DIAGNOSIS — Z9889 Other specified postprocedural states: Secondary | ICD-10-CM | POA: Diagnosis not present

## 2016-07-02 DIAGNOSIS — E7800 Pure hypercholesterolemia, unspecified: Secondary | ICD-10-CM | POA: Diagnosis not present

## 2016-07-02 DIAGNOSIS — E1140 Type 2 diabetes mellitus with diabetic neuropathy, unspecified: Secondary | ICD-10-CM | POA: Diagnosis not present

## 2016-07-02 DIAGNOSIS — Z170 Estrogen receptor positive status [ER+]: Secondary | ICD-10-CM | POA: Diagnosis not present

## 2016-07-02 DIAGNOSIS — F40240 Claustrophobia: Secondary | ICD-10-CM | POA: Diagnosis not present

## 2016-07-03 ENCOUNTER — Encounter: Payer: Self-pay | Admitting: Radiation Oncology

## 2016-07-03 ENCOUNTER — Ambulatory Visit
Admission: RE | Admit: 2016-07-03 | Discharge: 2016-07-03 | Disposition: A | Discharge status: Home / Self Care | Payer: Medicare HMO | Source: Ambulatory Visit | Attending: Radiation Oncology | Admitting: Radiation Oncology

## 2016-07-03 DIAGNOSIS — E7800 Pure hypercholesterolemia, unspecified: Secondary | ICD-10-CM | POA: Diagnosis not present

## 2016-07-03 DIAGNOSIS — Z170 Estrogen receptor positive status [ER+]: Secondary | ICD-10-CM | POA: Diagnosis not present

## 2016-07-03 DIAGNOSIS — F319 Bipolar disorder, unspecified: Secondary | ICD-10-CM | POA: Diagnosis not present

## 2016-07-03 DIAGNOSIS — C50511 Malignant neoplasm of lower-outer quadrant of right female breast: Secondary | ICD-10-CM | POA: Diagnosis not present

## 2016-07-03 DIAGNOSIS — Z87891 Personal history of nicotine dependence: Secondary | ICD-10-CM | POA: Diagnosis not present

## 2016-07-03 DIAGNOSIS — E1140 Type 2 diabetes mellitus with diabetic neuropathy, unspecified: Secondary | ICD-10-CM | POA: Diagnosis not present

## 2016-07-03 DIAGNOSIS — F40240 Claustrophobia: Secondary | ICD-10-CM | POA: Diagnosis not present

## 2016-07-03 DIAGNOSIS — Z9889 Other specified postprocedural states: Secondary | ICD-10-CM | POA: Diagnosis not present

## 2016-07-03 DIAGNOSIS — I10 Essential (primary) hypertension: Secondary | ICD-10-CM | POA: Diagnosis not present

## 2016-07-04 ENCOUNTER — Ambulatory Visit
Admission: RE | Admit: 2016-07-04 | Discharge: 2016-07-04 | Disposition: A | Discharge status: Home / Self Care | Payer: Medicare HMO | Source: Ambulatory Visit | Attending: Radiation Oncology | Admitting: Radiation Oncology

## 2016-07-04 DIAGNOSIS — E7800 Pure hypercholesterolemia, unspecified: Secondary | ICD-10-CM | POA: Diagnosis not present

## 2016-07-04 DIAGNOSIS — C50511 Malignant neoplasm of lower-outer quadrant of right female breast: Secondary | ICD-10-CM | POA: Diagnosis not present

## 2016-07-04 DIAGNOSIS — Z170 Estrogen receptor positive status [ER+]: Secondary | ICD-10-CM | POA: Diagnosis not present

## 2016-07-04 DIAGNOSIS — F40240 Claustrophobia: Secondary | ICD-10-CM | POA: Diagnosis not present

## 2016-07-04 DIAGNOSIS — E1140 Type 2 diabetes mellitus with diabetic neuropathy, unspecified: Secondary | ICD-10-CM | POA: Diagnosis not present

## 2016-07-04 DIAGNOSIS — Z87891 Personal history of nicotine dependence: Secondary | ICD-10-CM | POA: Diagnosis not present

## 2016-07-04 DIAGNOSIS — F319 Bipolar disorder, unspecified: Secondary | ICD-10-CM | POA: Diagnosis not present

## 2016-07-04 DIAGNOSIS — I10 Essential (primary) hypertension: Secondary | ICD-10-CM | POA: Diagnosis not present

## 2016-07-04 DIAGNOSIS — Z9889 Other specified postprocedural states: Secondary | ICD-10-CM | POA: Diagnosis not present

## 2016-07-04 NOTE — Progress Notes (Signed)
Photon Boost Complex Emergency planning/management officer Note Outpatient  Diagnosis:    ICD-9-CM ICD-10-CM   1. Malignant neoplasm of lower-outer quadrant of right breast of female, estrogen receptor positive ([place]) 174.5 C50.511    V86.0 Z17.0     The patient's CT images from her free-breathing simulation were reviewed to plan her boost treatment to her right breast  lumpectomy cavity.  The boost to the lumpectomy cavity will be delivered with 3 photon fields using MLCs for custom blocks again heart and lungs, with 10 and 6 MV photon energy.  This constitutes 3 complex treatment devices. Isodose plan was reviewed and approved. 10 Gy in 5 fractions prescribed.  -----------------------------------  Eppie Gibson, MD

## 2016-07-05 ENCOUNTER — Ambulatory Visit
Admission: RE | Admit: 2016-07-05 | Discharge: 2016-07-05 | Disposition: A | Discharge status: Home / Self Care | Payer: Medicare HMO | Source: Ambulatory Visit | Attending: Radiation Oncology | Admitting: Radiation Oncology

## 2016-07-05 DIAGNOSIS — Z170 Estrogen receptor positive status [ER+]: Secondary | ICD-10-CM | POA: Diagnosis not present

## 2016-07-05 DIAGNOSIS — C50511 Malignant neoplasm of lower-outer quadrant of right female breast: Secondary | ICD-10-CM | POA: Diagnosis not present

## 2016-07-05 DIAGNOSIS — Z9889 Other specified postprocedural states: Secondary | ICD-10-CM | POA: Diagnosis not present

## 2016-07-05 DIAGNOSIS — F40240 Claustrophobia: Secondary | ICD-10-CM | POA: Diagnosis not present

## 2016-07-05 DIAGNOSIS — I10 Essential (primary) hypertension: Secondary | ICD-10-CM | POA: Diagnosis not present

## 2016-07-05 DIAGNOSIS — Z87891 Personal history of nicotine dependence: Secondary | ICD-10-CM | POA: Diagnosis not present

## 2016-07-05 DIAGNOSIS — E1140 Type 2 diabetes mellitus with diabetic neuropathy, unspecified: Secondary | ICD-10-CM | POA: Diagnosis not present

## 2016-07-05 DIAGNOSIS — E7800 Pure hypercholesterolemia, unspecified: Secondary | ICD-10-CM | POA: Diagnosis not present

## 2016-07-05 DIAGNOSIS — F319 Bipolar disorder, unspecified: Secondary | ICD-10-CM | POA: Diagnosis not present

## 2016-07-06 ENCOUNTER — Ambulatory Visit
Admission: RE | Admit: 2016-07-06 | Discharge: 2016-07-06 | Disposition: A | Discharge status: Home / Self Care | Payer: Medicare HMO | Source: Ambulatory Visit | Attending: Radiation Oncology | Admitting: Radiation Oncology

## 2016-07-06 DIAGNOSIS — C50511 Malignant neoplasm of lower-outer quadrant of right female breast: Secondary | ICD-10-CM | POA: Diagnosis not present

## 2016-07-06 DIAGNOSIS — E1140 Type 2 diabetes mellitus with diabetic neuropathy, unspecified: Secondary | ICD-10-CM | POA: Diagnosis not present

## 2016-07-06 DIAGNOSIS — Z87891 Personal history of nicotine dependence: Secondary | ICD-10-CM | POA: Diagnosis not present

## 2016-07-06 DIAGNOSIS — Z170 Estrogen receptor positive status [ER+]: Secondary | ICD-10-CM | POA: Diagnosis not present

## 2016-07-06 DIAGNOSIS — F40240 Claustrophobia: Secondary | ICD-10-CM | POA: Diagnosis not present

## 2016-07-06 DIAGNOSIS — E7800 Pure hypercholesterolemia, unspecified: Secondary | ICD-10-CM | POA: Diagnosis not present

## 2016-07-06 DIAGNOSIS — Z9889 Other specified postprocedural states: Secondary | ICD-10-CM | POA: Diagnosis not present

## 2016-07-06 DIAGNOSIS — F319 Bipolar disorder, unspecified: Secondary | ICD-10-CM | POA: Diagnosis not present

## 2016-07-06 DIAGNOSIS — I10 Essential (primary) hypertension: Secondary | ICD-10-CM | POA: Diagnosis not present

## 2016-07-09 ENCOUNTER — Other Ambulatory Visit: Payer: Self-pay | Admitting: Family Medicine

## 2016-07-09 ENCOUNTER — Ambulatory Visit
Admission: RE | Admit: 2016-07-09 | Discharge: 2016-07-09 | Disposition: A | Discharge status: Home / Self Care | Payer: Medicare HMO | Source: Ambulatory Visit | Attending: Radiation Oncology | Admitting: Radiation Oncology

## 2016-07-09 VITALS — BP 111/75 | HR 83 | Resp 18 | Wt 274.8 lb

## 2016-07-09 DIAGNOSIS — F319 Bipolar disorder, unspecified: Secondary | ICD-10-CM | POA: Diagnosis not present

## 2016-07-09 DIAGNOSIS — E7800 Pure hypercholesterolemia, unspecified: Secondary | ICD-10-CM | POA: Diagnosis not present

## 2016-07-09 DIAGNOSIS — C50511 Malignant neoplasm of lower-outer quadrant of right female breast: Secondary | ICD-10-CM

## 2016-07-09 DIAGNOSIS — I10 Essential (primary) hypertension: Secondary | ICD-10-CM | POA: Diagnosis not present

## 2016-07-09 DIAGNOSIS — E1140 Type 2 diabetes mellitus with diabetic neuropathy, unspecified: Secondary | ICD-10-CM | POA: Diagnosis not present

## 2016-07-09 DIAGNOSIS — Z9889 Other specified postprocedural states: Secondary | ICD-10-CM | POA: Diagnosis not present

## 2016-07-09 DIAGNOSIS — F40240 Claustrophobia: Secondary | ICD-10-CM | POA: Diagnosis not present

## 2016-07-09 DIAGNOSIS — Z170 Estrogen receptor positive status [ER+]: Secondary | ICD-10-CM | POA: Diagnosis not present

## 2016-07-09 DIAGNOSIS — Z87891 Personal history of nicotine dependence: Secondary | ICD-10-CM | POA: Diagnosis not present

## 2016-07-09 MED ORDER — RADIAPLEXRX EX GEL
Freq: Once | CUTANEOUS | Status: AC
  Administered 2016-07-09: 12:00:00 via TOPICAL

## 2016-07-09 NOTE — Progress Notes (Signed)
Weight and vitals stable. Denies pain. No evidence of lymphedema noted. Faint hyperpigmentation of right breast/treatment field without desquamation noted. Reports using radiaplex bid as directed. Second tube of radiaplex provided to patient today. Reports moderate fatigue.   BP 111/75 (BP Location: Left Arm, Patient Position: Sitting, Cuff Size: Normal)   Pulse 83   Resp 18   Wt 274 lb 12.8 oz (124.6 kg)   SpO2 100%   BMI 43.04 kg/m  Wt Readings from Last 3 Encounters:  07/09/16 274 lb 12.8 oz (124.6 kg)  06/25/16 269 lb 12.8 oz (122.4 kg)  06/18/16 272 lb 6.4 oz (123.6 kg)

## 2016-07-09 NOTE — Progress Notes (Signed)
   Weekly Management Note:  Outpatient    ICD-9-CM ICD-10-CM   1. Malignant neoplasm of lower-outer quadrant of right female breast, unspecified estrogen receptor status (HCC) 174.5 C50.511 hyaluronate sodium (RADIAPLEXRX) gel    Current Dose:  40 Gy  Projected Dose: 60 Gy   Narrative:  The patient presents for routine under treatment assessment.  CBCT/MVCT images/Port film x-rays were reviewed.  The chart was checked. Patient denies pain. She reports moderate fatigue. Per nursing, no evidence of lymphedema noted, slight hyperpigmentation of the right breast without desquamation. Patient is using radiaplex bid.  Physical Findings:  weight is 274 lb 12.8 oz (124.6 kg). Her blood pressure is 111/75 and her pulse is 83. Her respiration is 18 and oxygen saturation is 100%.   Wt Readings from Last 3 Encounters:  07/09/16 274 lb 12.8 oz (124.6 kg)  06/25/16 269 lb 12.8 oz (122.4 kg)  06/18/16 272 lb 6.4 oz (123.6 kg)   Hyperpigmentation changes and erythema in the inframammary fold region of right breast. NO skin breakdown.  Impression:  The patient is tolerating radiotherapy.  Plan:  Continue radiotherapy as planned. Patient instructed to apply Radiplex to intact skin in treatment fields. Patient was given a refill of Radiaplex.    ________________________________   This document serves as a record of services personally performed by Gery Pray, MD. It was created on his behalf by Bethann Humble, a trained medical scribe. The creation of this record is based on the scribe's personal observations and the provider's statements to them. This document has been checked and approved by the attending provider.

## 2016-07-10 ENCOUNTER — Ambulatory Visit
Admission: RE | Admit: 2016-07-10 | Discharge: 2016-07-10 | Disposition: A | Discharge status: Home / Self Care | Payer: Medicare HMO | Source: Ambulatory Visit | Attending: Radiation Oncology | Admitting: Radiation Oncology

## 2016-07-10 DIAGNOSIS — F319 Bipolar disorder, unspecified: Secondary | ICD-10-CM | POA: Diagnosis not present

## 2016-07-10 DIAGNOSIS — C50511 Malignant neoplasm of lower-outer quadrant of right female breast: Secondary | ICD-10-CM | POA: Diagnosis not present

## 2016-07-10 DIAGNOSIS — E1140 Type 2 diabetes mellitus with diabetic neuropathy, unspecified: Secondary | ICD-10-CM | POA: Diagnosis not present

## 2016-07-10 DIAGNOSIS — E7800 Pure hypercholesterolemia, unspecified: Secondary | ICD-10-CM | POA: Diagnosis not present

## 2016-07-10 DIAGNOSIS — I10 Essential (primary) hypertension: Secondary | ICD-10-CM | POA: Diagnosis not present

## 2016-07-10 DIAGNOSIS — Z87891 Personal history of nicotine dependence: Secondary | ICD-10-CM | POA: Diagnosis not present

## 2016-07-10 DIAGNOSIS — F40240 Claustrophobia: Secondary | ICD-10-CM | POA: Diagnosis not present

## 2016-07-10 DIAGNOSIS — Z170 Estrogen receptor positive status [ER+]: Secondary | ICD-10-CM | POA: Diagnosis not present

## 2016-07-10 DIAGNOSIS — Z9889 Other specified postprocedural states: Secondary | ICD-10-CM | POA: Diagnosis not present

## 2016-07-11 ENCOUNTER — Ambulatory Visit
Admission: RE | Admit: 2016-07-11 | Discharge: 2016-07-11 | Disposition: A | Discharge status: Home / Self Care | Payer: Medicare HMO | Source: Ambulatory Visit | Attending: Radiation Oncology | Admitting: Radiation Oncology

## 2016-07-11 DIAGNOSIS — Z9889 Other specified postprocedural states: Secondary | ICD-10-CM | POA: Diagnosis not present

## 2016-07-11 DIAGNOSIS — E1140 Type 2 diabetes mellitus with diabetic neuropathy, unspecified: Secondary | ICD-10-CM | POA: Diagnosis not present

## 2016-07-11 DIAGNOSIS — E7800 Pure hypercholesterolemia, unspecified: Secondary | ICD-10-CM | POA: Diagnosis not present

## 2016-07-11 DIAGNOSIS — F40240 Claustrophobia: Secondary | ICD-10-CM | POA: Diagnosis not present

## 2016-07-11 DIAGNOSIS — C50511 Malignant neoplasm of lower-outer quadrant of right female breast: Secondary | ICD-10-CM | POA: Diagnosis not present

## 2016-07-11 DIAGNOSIS — I10 Essential (primary) hypertension: Secondary | ICD-10-CM | POA: Diagnosis not present

## 2016-07-11 DIAGNOSIS — Z170 Estrogen receptor positive status [ER+]: Secondary | ICD-10-CM | POA: Diagnosis not present

## 2016-07-11 DIAGNOSIS — F319 Bipolar disorder, unspecified: Secondary | ICD-10-CM | POA: Diagnosis not present

## 2016-07-11 DIAGNOSIS — Z87891 Personal history of nicotine dependence: Secondary | ICD-10-CM | POA: Diagnosis not present

## 2016-07-12 ENCOUNTER — Ambulatory Visit
Admission: RE | Admit: 2016-07-12 | Discharge: 2016-07-12 | Disposition: A | Discharge status: Home / Self Care | Payer: Medicare HMO | Source: Ambulatory Visit | Attending: Radiation Oncology | Admitting: Radiation Oncology

## 2016-07-12 DIAGNOSIS — F40240 Claustrophobia: Secondary | ICD-10-CM | POA: Diagnosis not present

## 2016-07-12 DIAGNOSIS — Z9889 Other specified postprocedural states: Secondary | ICD-10-CM | POA: Diagnosis not present

## 2016-07-12 DIAGNOSIS — I10 Essential (primary) hypertension: Secondary | ICD-10-CM | POA: Diagnosis not present

## 2016-07-12 DIAGNOSIS — Z170 Estrogen receptor positive status [ER+]: Secondary | ICD-10-CM | POA: Diagnosis not present

## 2016-07-12 DIAGNOSIS — E7800 Pure hypercholesterolemia, unspecified: Secondary | ICD-10-CM | POA: Diagnosis not present

## 2016-07-12 DIAGNOSIS — Z87891 Personal history of nicotine dependence: Secondary | ICD-10-CM | POA: Diagnosis not present

## 2016-07-12 DIAGNOSIS — C50511 Malignant neoplasm of lower-outer quadrant of right female breast: Secondary | ICD-10-CM | POA: Diagnosis not present

## 2016-07-12 DIAGNOSIS — E1140 Type 2 diabetes mellitus with diabetic neuropathy, unspecified: Secondary | ICD-10-CM | POA: Diagnosis not present

## 2016-07-12 DIAGNOSIS — F319 Bipolar disorder, unspecified: Secondary | ICD-10-CM | POA: Diagnosis not present

## 2016-07-13 ENCOUNTER — Ambulatory Visit
Admission: RE | Admit: 2016-07-13 | Discharge: 2016-07-13 | Disposition: A | Discharge status: Home / Self Care | Payer: Medicare HMO | Source: Ambulatory Visit | Attending: Radiation Oncology | Admitting: Radiation Oncology

## 2016-07-13 DIAGNOSIS — E1140 Type 2 diabetes mellitus with diabetic neuropathy, unspecified: Secondary | ICD-10-CM | POA: Diagnosis not present

## 2016-07-13 DIAGNOSIS — I10 Essential (primary) hypertension: Secondary | ICD-10-CM | POA: Diagnosis not present

## 2016-07-13 DIAGNOSIS — Z87891 Personal history of nicotine dependence: Secondary | ICD-10-CM | POA: Diagnosis not present

## 2016-07-13 DIAGNOSIS — C50511 Malignant neoplasm of lower-outer quadrant of right female breast: Secondary | ICD-10-CM | POA: Diagnosis not present

## 2016-07-13 DIAGNOSIS — Z9889 Other specified postprocedural states: Secondary | ICD-10-CM | POA: Diagnosis not present

## 2016-07-13 DIAGNOSIS — E7800 Pure hypercholesterolemia, unspecified: Secondary | ICD-10-CM | POA: Diagnosis not present

## 2016-07-13 DIAGNOSIS — F319 Bipolar disorder, unspecified: Secondary | ICD-10-CM | POA: Diagnosis not present

## 2016-07-13 DIAGNOSIS — Z170 Estrogen receptor positive status [ER+]: Secondary | ICD-10-CM | POA: Diagnosis not present

## 2016-07-13 DIAGNOSIS — F40240 Claustrophobia: Secondary | ICD-10-CM | POA: Diagnosis not present

## 2016-07-16 ENCOUNTER — Ambulatory Visit: Payer: Medicare HMO

## 2016-07-16 ENCOUNTER — Ambulatory Visit
Admission: RE | Admit: 2016-07-16 | Discharge: 2016-07-16 | Disposition: A | Discharge status: Home / Self Care | Payer: Medicare HMO | Source: Ambulatory Visit | Attending: Radiation Oncology | Admitting: Radiation Oncology

## 2016-07-16 DIAGNOSIS — F319 Bipolar disorder, unspecified: Secondary | ICD-10-CM | POA: Diagnosis not present

## 2016-07-16 DIAGNOSIS — Z87891 Personal history of nicotine dependence: Secondary | ICD-10-CM | POA: Diagnosis not present

## 2016-07-16 DIAGNOSIS — I10 Essential (primary) hypertension: Secondary | ICD-10-CM | POA: Diagnosis not present

## 2016-07-16 DIAGNOSIS — E7800 Pure hypercholesterolemia, unspecified: Secondary | ICD-10-CM | POA: Diagnosis not present

## 2016-07-16 DIAGNOSIS — Z9889 Other specified postprocedural states: Secondary | ICD-10-CM | POA: Diagnosis not present

## 2016-07-16 DIAGNOSIS — C50511 Malignant neoplasm of lower-outer quadrant of right female breast: Secondary | ICD-10-CM | POA: Diagnosis not present

## 2016-07-16 DIAGNOSIS — Z170 Estrogen receptor positive status [ER+]: Secondary | ICD-10-CM | POA: Diagnosis not present

## 2016-07-16 DIAGNOSIS — E1140 Type 2 diabetes mellitus with diabetic neuropathy, unspecified: Secondary | ICD-10-CM | POA: Diagnosis not present

## 2016-07-16 DIAGNOSIS — F40240 Claustrophobia: Secondary | ICD-10-CM | POA: Diagnosis not present

## 2016-07-17 ENCOUNTER — Ambulatory Visit
Admission: RE | Admit: 2016-07-17 | Discharge: 2016-07-17 | Disposition: A | Discharge status: Home / Self Care | Payer: Medicare HMO | Source: Ambulatory Visit | Attending: Radiation Oncology | Admitting: Radiation Oncology

## 2016-07-17 ENCOUNTER — Ambulatory Visit: Payer: Medicare HMO

## 2016-07-17 DIAGNOSIS — I10 Essential (primary) hypertension: Secondary | ICD-10-CM | POA: Diagnosis not present

## 2016-07-17 DIAGNOSIS — F40240 Claustrophobia: Secondary | ICD-10-CM | POA: Diagnosis not present

## 2016-07-17 DIAGNOSIS — C50511 Malignant neoplasm of lower-outer quadrant of right female breast: Secondary | ICD-10-CM | POA: Diagnosis not present

## 2016-07-17 DIAGNOSIS — E1140 Type 2 diabetes mellitus with diabetic neuropathy, unspecified: Secondary | ICD-10-CM | POA: Diagnosis not present

## 2016-07-17 DIAGNOSIS — E7800 Pure hypercholesterolemia, unspecified: Secondary | ICD-10-CM | POA: Diagnosis not present

## 2016-07-17 DIAGNOSIS — Z87891 Personal history of nicotine dependence: Secondary | ICD-10-CM | POA: Diagnosis not present

## 2016-07-17 DIAGNOSIS — Z9889 Other specified postprocedural states: Secondary | ICD-10-CM | POA: Diagnosis not present

## 2016-07-17 DIAGNOSIS — Z170 Estrogen receptor positive status [ER+]: Secondary | ICD-10-CM | POA: Diagnosis not present

## 2016-07-17 DIAGNOSIS — F319 Bipolar disorder, unspecified: Secondary | ICD-10-CM | POA: Diagnosis not present

## 2016-07-18 ENCOUNTER — Ambulatory Visit
Admission: RE | Admit: 2016-07-18 | Discharge: 2016-07-18 | Disposition: A | Discharge status: Home / Self Care | Payer: Medicare HMO | Source: Ambulatory Visit | Attending: Radiation Oncology | Admitting: Radiation Oncology

## 2016-07-18 DIAGNOSIS — C50511 Malignant neoplasm of lower-outer quadrant of right female breast: Secondary | ICD-10-CM | POA: Diagnosis not present

## 2016-07-18 DIAGNOSIS — F319 Bipolar disorder, unspecified: Secondary | ICD-10-CM | POA: Diagnosis not present

## 2016-07-18 DIAGNOSIS — E1140 Type 2 diabetes mellitus with diabetic neuropathy, unspecified: Secondary | ICD-10-CM | POA: Diagnosis not present

## 2016-07-18 DIAGNOSIS — I10 Essential (primary) hypertension: Secondary | ICD-10-CM | POA: Diagnosis not present

## 2016-07-18 DIAGNOSIS — Z170 Estrogen receptor positive status [ER+]: Secondary | ICD-10-CM | POA: Diagnosis not present

## 2016-07-18 DIAGNOSIS — Z87891 Personal history of nicotine dependence: Secondary | ICD-10-CM | POA: Diagnosis not present

## 2016-07-18 DIAGNOSIS — Z9889 Other specified postprocedural states: Secondary | ICD-10-CM | POA: Diagnosis not present

## 2016-07-18 DIAGNOSIS — E7800 Pure hypercholesterolemia, unspecified: Secondary | ICD-10-CM | POA: Diagnosis not present

## 2016-07-18 DIAGNOSIS — F40240 Claustrophobia: Secondary | ICD-10-CM | POA: Diagnosis not present

## 2016-07-19 ENCOUNTER — Ambulatory Visit
Admission: RE | Admit: 2016-07-19 | Discharge: 2016-07-19 | Disposition: A | Discharge status: Home / Self Care | Payer: Medicare HMO | Source: Ambulatory Visit | Attending: Radiation Oncology | Admitting: Radiation Oncology

## 2016-07-19 DIAGNOSIS — F40240 Claustrophobia: Secondary | ICD-10-CM | POA: Diagnosis not present

## 2016-07-19 DIAGNOSIS — Z9889 Other specified postprocedural states: Secondary | ICD-10-CM | POA: Diagnosis not present

## 2016-07-19 DIAGNOSIS — Z170 Estrogen receptor positive status [ER+]: Secondary | ICD-10-CM | POA: Diagnosis not present

## 2016-07-19 DIAGNOSIS — C50511 Malignant neoplasm of lower-outer quadrant of right female breast: Secondary | ICD-10-CM | POA: Diagnosis not present

## 2016-07-19 DIAGNOSIS — I10 Essential (primary) hypertension: Secondary | ICD-10-CM | POA: Diagnosis not present

## 2016-07-19 DIAGNOSIS — E1140 Type 2 diabetes mellitus with diabetic neuropathy, unspecified: Secondary | ICD-10-CM | POA: Diagnosis not present

## 2016-07-19 DIAGNOSIS — F319 Bipolar disorder, unspecified: Secondary | ICD-10-CM | POA: Diagnosis not present

## 2016-07-19 DIAGNOSIS — Z87891 Personal history of nicotine dependence: Secondary | ICD-10-CM | POA: Diagnosis not present

## 2016-07-19 DIAGNOSIS — E7800 Pure hypercholesterolemia, unspecified: Secondary | ICD-10-CM | POA: Diagnosis not present

## 2016-07-20 ENCOUNTER — Ambulatory Visit: Payer: Medicare HMO

## 2016-07-20 ENCOUNTER — Ambulatory Visit
Admission: RE | Admit: 2016-07-20 | Discharge: 2016-07-20 | Disposition: A | Discharge status: Home / Self Care | Payer: Medicare HMO | Source: Ambulatory Visit | Attending: Radiation Oncology | Admitting: Radiation Oncology

## 2016-07-20 DIAGNOSIS — Z170 Estrogen receptor positive status [ER+]: Secondary | ICD-10-CM | POA: Diagnosis not present

## 2016-07-20 DIAGNOSIS — F319 Bipolar disorder, unspecified: Secondary | ICD-10-CM | POA: Diagnosis not present

## 2016-07-20 DIAGNOSIS — Z87891 Personal history of nicotine dependence: Secondary | ICD-10-CM | POA: Diagnosis not present

## 2016-07-20 DIAGNOSIS — F40240 Claustrophobia: Secondary | ICD-10-CM | POA: Diagnosis not present

## 2016-07-20 DIAGNOSIS — E7800 Pure hypercholesterolemia, unspecified: Secondary | ICD-10-CM | POA: Diagnosis not present

## 2016-07-20 DIAGNOSIS — Z9889 Other specified postprocedural states: Secondary | ICD-10-CM | POA: Diagnosis not present

## 2016-07-20 DIAGNOSIS — E1140 Type 2 diabetes mellitus with diabetic neuropathy, unspecified: Secondary | ICD-10-CM | POA: Diagnosis not present

## 2016-07-20 DIAGNOSIS — C50511 Malignant neoplasm of lower-outer quadrant of right female breast: Secondary | ICD-10-CM | POA: Diagnosis not present

## 2016-07-20 DIAGNOSIS — I10 Essential (primary) hypertension: Secondary | ICD-10-CM | POA: Diagnosis not present

## 2016-07-23 ENCOUNTER — Ambulatory Visit
Admission: RE | Admit: 2016-07-23 | Discharge: 2016-07-23 | Disposition: A | Discharge status: Home / Self Care | Payer: Medicare HMO | Source: Ambulatory Visit | Attending: Radiation Oncology | Admitting: Radiation Oncology

## 2016-07-23 ENCOUNTER — Telehealth: Payer: Self-pay | Admitting: *Deleted

## 2016-07-23 DIAGNOSIS — I10 Essential (primary) hypertension: Secondary | ICD-10-CM | POA: Diagnosis not present

## 2016-07-23 DIAGNOSIS — E1140 Type 2 diabetes mellitus with diabetic neuropathy, unspecified: Secondary | ICD-10-CM | POA: Diagnosis not present

## 2016-07-23 DIAGNOSIS — Z170 Estrogen receptor positive status [ER+]: Secondary | ICD-10-CM | POA: Diagnosis not present

## 2016-07-23 DIAGNOSIS — F40240 Claustrophobia: Secondary | ICD-10-CM | POA: Diagnosis not present

## 2016-07-23 DIAGNOSIS — Z9889 Other specified postprocedural states: Secondary | ICD-10-CM | POA: Diagnosis not present

## 2016-07-23 DIAGNOSIS — E7800 Pure hypercholesterolemia, unspecified: Secondary | ICD-10-CM | POA: Diagnosis not present

## 2016-07-23 DIAGNOSIS — F319 Bipolar disorder, unspecified: Secondary | ICD-10-CM | POA: Diagnosis not present

## 2016-07-23 DIAGNOSIS — Z87891 Personal history of nicotine dependence: Secondary | ICD-10-CM | POA: Diagnosis not present

## 2016-07-23 DIAGNOSIS — C50511 Malignant neoplasm of lower-outer quadrant of right female breast: Secondary | ICD-10-CM | POA: Diagnosis not present

## 2016-07-23 NOTE — Telephone Encounter (Signed)
  Oncology Nurse Navigator Documentation  Navigator Location: [facility] (07/23/16 1100)   )Navigator Encounter Type: Telephone (07/23/16 1100) Telephone: [place] [facility] (07/23/16 1100)  Left vm to congratulate on completion of xrt. Contact information provided for questions.   Surgery Date: 04/25/16 (07/23/16 1100)           Treatment Initiated Date: 03/08/16 (07/23/16 1100) Patient Visit Type: HSVEXO (07/23/16 1100) Treatment Phase: Final Radiation Tx (07/23/16 1100)     Interventions: Referrals (07/23/16 1100) Referrals: Survivorship (07/23/16 1100)          Acuity: Level 1 (07/23/16 1100)         Time Spent with Patient: 15 (07/23/16 1100)

## 2016-07-24 ENCOUNTER — Telehealth: Payer: Self-pay | Admitting: Hematology

## 2016-07-24 NOTE — Telephone Encounter (Signed)
lvm to inform pt of SCP in July per LOS

## 2016-07-25 ENCOUNTER — Encounter: Payer: Self-pay | Admitting: Radiation Oncology

## 2016-07-25 NOTE — Progress Notes (Signed)
  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 She denied pain or fatigue throughout treatment. She developed radiation related skin changes including erythema and dryness to the skin over the right breast. Overall the patient was without complaint.  Plan: The patient has completed radiation treatment. The patient will try using Neosporin to the skin of the inframammary fold if peeling occurs. The patient will return to radiation oncology clinic for routine followup in one month. I advised them to call or return sooner if they have any questions or concerns related to their recovery or treatment.  -----------------------------------  Eppie Gibson, MD  This document serves as a record of services personally performed by Eppie Gibson, MD. It was created on her behalf by Maryla Morrow, a trained medical scribe. The creation of this record is based on the scribe's personal observations and the provider's statements to them. This document has been checked and approved by the attending provider.

## 2016-08-02 ENCOUNTER — Ambulatory Visit (INDEPENDENT_AMBULATORY_CARE_PROVIDER_SITE_OTHER): Payer: Medicare HMO | Admitting: Family Medicine

## 2016-08-02 VITALS — BP 110/76 | HR 84 | Temp 98.4°F | Ht 67.0 in | Wt 285.4 lb

## 2016-08-02 DIAGNOSIS — E1140 Type 2 diabetes mellitus with diabetic neuropathy, unspecified: Secondary | ICD-10-CM | POA: Diagnosis not present

## 2016-08-02 DIAGNOSIS — E785 Hyperlipidemia, unspecified: Secondary | ICD-10-CM

## 2016-08-02 DIAGNOSIS — Z794 Long term (current) use of insulin: Secondary | ICD-10-CM | POA: Diagnosis not present

## 2016-08-02 DIAGNOSIS — F3170 Bipolar disorder, currently in remission, most recent episode unspecified: Secondary | ICD-10-CM

## 2016-08-02 DIAGNOSIS — E1165 Type 2 diabetes mellitus with hyperglycemia: Secondary | ICD-10-CM | POA: Diagnosis not present

## 2016-08-02 DIAGNOSIS — IMO0002 Reserved for concepts with insufficient information to code with codable children: Secondary | ICD-10-CM

## 2016-08-02 LAB — HEMOGLOBIN A1C: HEMOGLOBIN A1C: 9.9 % — AB (ref 4.6–6.5)

## 2016-08-02 NOTE — Patient Instructions (Addendum)
It was very nice to see you today- take care and continue to watch your sugars. I am going to check an A1c today to see if we need to adjust your medications since your sugars have been high.    Please do get an eye exam soon- have your eye doctor send me a report I found your last colonoscopy report from [place] they wanted you to have a repeat in 8- 10 years.  Your last colonosocpy was done in 2015 so you are ok for now!

## 2016-08-02 NOTE — Progress Notes (Addendum)
[place] [facility] [address] [place], [place] [address] [phone]  Date:  08/02/2016   Name:  Rhonda Berg   DOB:  [birth year]   MRN:  [id number]  PCP:  Lamar Blinks, MD    Chief Complaint: Follow-up (Pt here for 2 month f/u. )   History of Present Illness:  Rhonda Berg is a 76 y.o. very pleasant female patient who presents with the following:  Follow up visit today. Last seen by myself in February of this year; partial HPI from that visit:  History of DM, neuropathy, bipolar disorder, breast cancer Taking B12, still waiting for increased energy from this.  Not taking Vicodin anymore, that was for surgery.  Energy level about the same.  Zanaflex does not seem to help for her back pain.  Is taking 20 units of Lantus daily.  CBGs have been 110-140.  Checking CBGs BID, in the AM and bedtime.  CBGs have been lower, since she had surgery.    Probably will start radiation 06-11-2016 for her breast cancer.  She had lumpectomy in January She is not on any chemo but is taking an anti-estrogen; aromasin  She is feeling well from a breast cancer standpoint and has healed well from her operation She is a former pt of mine from [facility]- we may not have all of her immunizations and have called to get these records She does report that she got an unknown pneumonia shot several years ago but needs her booster.  Suspect that she has not yet had her prevnar and will update this for her today She declines a flu shot  Due for eye exam, bone density  She finished her radiation already- she did 6 weeks of treatment.    She last had a colonoscopy in 2015- she is not sure of when she was supposed to have a follow- up screening   Her glucose is under 200; she is trying to watch her diet but admits she has been eating too much cake The aromasin also causes weight gain; she is not sure how long she will need to be on this Wt Readings  from Last 3 Encounters:  08/02/16 285 lb 6.4 oz (129.5 kg)  07/09/16 274 lb 12.8 oz (124.6 kg)  06/25/16 269 lb 12.8 oz (122.4 kg)   She does have a psychiatrist who manages her bipolar disease   She lost her dentures- seems that she threw them away by accident.  She is trying to get a new set   Lab Results  Component Value Date   HGBA1C 8.9 (H) 05/30/2016    Patient Active Problem List   Diagnosis Date Noted  . Breast cancer of lower-outer quadrant of right female breast ([place]) 01/30/2016  . Uncontrolled diabetes mellitus type 2 without complications ([place]) 41/28/7867  . Peripheral edema 11/24/2015  . Diabetic polyneuropathy associated with type 2 diabetes mellitus ([place]) 11/24/2015  . Chronic back pain 11/24/2015  . Bipolar affective disorder in remission ([place]) 11/24/2015  . Hyperlipidemia 11/24/2015  . Wheezing 11/24/2015    Past Medical History:  Diagnosis Date  . Arthritis   . Bipolar 1 disorder ([place])   . Cancer ([place])    breast  . Depression   . Diabetes mellitus without complication ([place])   . Diabetic neuropathy ([place])   . Dyspnea   . Hypercholesteremia   . Hypertension   . Peripheral edema   . Proteinuria   . Sleep  apnea    has CPAP but doesnt wear it    Past Surgical History:  Procedure Laterality Date  . ABDOMINAL HYSTERECTOMY    . BREAST LUMPECTOMY WITH RADIOACTIVE SEED AND SENTINEL LYMPH NODE BIOPSY Right 04/25/2016   Procedure: RIGHT BREAST LUMPECTOMY WITH RADIOACTIVE SEED AND SENTINEL LYMPH NODE BIOPSY;  Surgeon: Erroll Luna, MD;  Location: [facility];  Service: General;  Laterality: Right;  . COLONOSCOPY    . KNEE SURGERY     bilateral partial knee replacements    Social History  Substance Use Topics  . Smoking status: Former Smoker    Packs/day: 0.25    Years: 40.00    Quit date: 04/09/2002  . Smokeless tobacco: Never Used  . Alcohol use No    Family History  Problem Relation Age of Onset  . Cancer Mother     breast cancer   . Cancer Sister      breast cancer   . Cancer Maternal Aunt 63    breast cancer   . Cancer Cousin     colon cancer     No Known Allergies  Medication list has been reviewed and updated.  Current Outpatient Prescriptions on File Prior to Visit  Medication Sig Dispense Refill  . albuterol (PROVENTIL HFA;VENTOLIN HFA) 108 (90 Base) MCG/ACT inhaler Inhale 2 puffs into the lungs every 6 (six) hours as needed for wheezing or shortness of breath. 1 Inhaler 6  . amantadine (SYMMETREL) 100 MG capsule Take 100 mg by mouth 2 (two) times daily.    [name] aspirin 81 MG tablet Take 81 mg by mouth daily.    . BD PEN NEEDLE NANO U/F 32G X 4 MM MISC Check sugar 2-3 times daily    . Blood Glucose Monitoring Suppl MISC Pt uses one touch verio flex. Please dispense test strips #100 and lancets #100. She tests her sugar 2-3x daily 100 each 3  . clonazePAM (KLONOPIN) 1 MG tablet Take 0.5-1 mg by mouth 2 (two) times daily. 0.5 mg in the day and 1 mg at bedtime    . divalproex (DEPAKOTE ER) 250 MG 24 hr tablet Take 250 mg by mouth daily.    [name] exemestane (AROMASIN) 25 MG tablet take 1 tablet by mouth AFTER BREAKFAST (Patient not taking: Reported on 06/18/2016) 30 tablet 2  . furosemide (LASIX) 40 MG tablet Take 1 tablet (40 mg total) by mouth daily. 30 tablet 6  . gabapentin (NEURONTIN) 600 MG tablet Take 1 tablet (600 mg total) by mouth 4 (four) times daily. 120 tablet 11  . HYDROcodone-acetaminophen (NORCO) 5-325 MG tablet Take 1 tablet by mouth every 6 (six) hours as needed for moderate pain. (Patient not taking: Reported on 06/18/2016) 30 tablet 0  . LANTUS SOLOSTAR 100 UNIT/ML Solostar Pen Take 48 units subcutaneously once daily.  Adjust as needed (Patient taking differently: Inject 40 Units into the skin daily before breakfast. Adjust as needed) 15 mL 5  . metFORMIN (GLUCOPHAGE) 1000 MG tablet Take 1 tablet (1,000 mg total) by mouth 2 (two) times daily with a meal. 180 tablet 3  . mirtazapine (REMERON) 15 MG tablet Take 15 mg by  mouth at bedtime.    . propranolol (INDERAL) 10 MG tablet take 1 tablet by mouth twice a day 60 tablet 3  . QUEtiapine (SEROQUEL) 200 MG tablet Take 1 tablet by mouth at bedtime.    . simvastatin (ZOCOR) 40 MG tablet Take 1 tablet (40 mg total) by mouth every evening. 30 tablet 6  .  tiZANidine (ZANAFLEX) 2 MG tablet Take 1 tablet (2 mg total) by mouth at bedtime as needed for muscle spasms (low back pain). (Patient not taking: Reported on 06/26/2016) 30 tablet 5   No current facility-administered medications on file prior to visit.     Review of Systems:  As per HPI- otherwise negative.   Physical Examination: Vitals:   08/02/16 1007  BP: 110/76  Pulse: 84  Temp: 98.4 F (36.9 C)   Vitals:   08/02/16 1007  Weight: 285 lb 6.4 oz (129.5 kg)  Height: 5\' 7"  (1.702 m)   Body mass index is 44.7 kg/m. Ideal Body Weight: Weight in (lb) to have BMI = 25: 159.3  GEN: WDWN, NAD, Non-toxic, A & O x 3, obese, her speech is harder to understand as she does not have her teeth HEENT: Atraumatic, Normocephalic. Neck supple. No masses, No LAD. Ears and Nose: No external deformity. CV: RRR, No M/G/R. No JVD. No thrill. No extra heart sounds. PULM: CTA B, no wheezes, crackles, rhonchi. No retractions. No resp. distress. No accessory muscle use. EXTR: No c/c/e NEURO Normal gait.  PSYCH: Normally interactive. Conversant. Not depressed or anxious appearing.  Calm demeanor.    Assessment and Plan: Uncontrolled type 2 diabetes mellitus with diabetic neuropathy, with long-term current use of insulin ([place]) - Plan: Hemoglobin A1c  Dyslipidemia  Bipolar affective disorder in remission [facility])  It has been less than 3 months, but will check an A1c today as she reports poor diet and her weight is up.  May need to adjust medications soon Encouraged an eye exam Her mood is under ok control currently  Reviewed her last colonoscopy- it requested 8- 10 year follow-up Will plan further follow- up pending  labs.  Signed Lamar Blinks, MD  Received her labs 5/1- called but no answer,.  Will have to try back later  Results for orders placed or performed in visit on 08/02/16  Hemoglobin A1c  Result Value Ref Range   Hgb A1c MFr Bld 9.9 (H) 4.6 - 6.5 %   Her A1c is well above goal Metformin 1000 BID, lantus  Called her back 5/7-  LMOM

## 2016-08-07 ENCOUNTER — Other Ambulatory Visit: Payer: Self-pay | Admitting: Family Medicine

## 2016-08-07 DIAGNOSIS — R609 Edema, unspecified: Secondary | ICD-10-CM

## 2016-08-16 ENCOUNTER — Other Ambulatory Visit: Payer: Medicare HMO

## 2016-08-16 ENCOUNTER — Ambulatory Visit: Payer: Medicare HMO | Admitting: Adult Health

## 2016-08-16 ENCOUNTER — Encounter: Payer: Self-pay | Admitting: Adult Health

## 2016-08-17 ENCOUNTER — Encounter: Payer: Self-pay | Admitting: Radiation Oncology

## 2016-08-17 ENCOUNTER — Encounter: Payer: Self-pay | Admitting: Family Medicine

## 2016-08-24 ENCOUNTER — Telehealth: Payer: Self-pay | Admitting: Adult Health

## 2016-08-24 ENCOUNTER — Ambulatory Visit
Admission: RE | Admit: 2016-08-24 | Discharge: 2016-08-24 | Disposition: A | Discharge status: Home / Self Care | Payer: Medicare HMO | Source: Ambulatory Visit | Attending: Radiation Oncology | Admitting: Radiation Oncology

## 2016-08-24 ENCOUNTER — Encounter: Payer: Self-pay | Admitting: Radiation Oncology

## 2016-08-24 DIAGNOSIS — F40240 Claustrophobia: Secondary | ICD-10-CM | POA: Diagnosis not present

## 2016-08-24 DIAGNOSIS — I10 Essential (primary) hypertension: Secondary | ICD-10-CM | POA: Insufficient documentation

## 2016-08-24 DIAGNOSIS — Z87891 Personal history of nicotine dependence: Secondary | ICD-10-CM | POA: Insufficient documentation

## 2016-08-24 DIAGNOSIS — Z7982 Long term (current) use of aspirin: Secondary | ICD-10-CM | POA: Diagnosis not present

## 2016-08-24 DIAGNOSIS — F319 Bipolar disorder, unspecified: Secondary | ICD-10-CM | POA: Diagnosis not present

## 2016-08-24 DIAGNOSIS — C50511 Malignant neoplasm of lower-outer quadrant of right female breast: Secondary | ICD-10-CM | POA: Diagnosis not present

## 2016-08-24 DIAGNOSIS — Z79899 Other long term (current) drug therapy: Secondary | ICD-10-CM | POA: Diagnosis not present

## 2016-08-24 DIAGNOSIS — Z170 Estrogen receptor positive status [ER+]: Secondary | ICD-10-CM | POA: Diagnosis not present

## 2016-08-24 DIAGNOSIS — E1140 Type 2 diabetes mellitus with diabetic neuropathy, unspecified: Secondary | ICD-10-CM | POA: Diagnosis not present

## 2016-08-24 DIAGNOSIS — E7800 Pure hypercholesterolemia, unspecified: Secondary | ICD-10-CM | POA: Insufficient documentation

## 2016-08-24 DIAGNOSIS — Z7984 Long term (current) use of oral hypoglycemic drugs: Secondary | ICD-10-CM | POA: Insufficient documentation

## 2016-08-24 DIAGNOSIS — Z9889 Other specified postprocedural states: Secondary | ICD-10-CM | POA: Diagnosis not present

## 2016-08-24 HISTORY — DX: Personal history of irradiation: Z92.3

## 2016-08-24 NOTE — Progress Notes (Signed)
Radiation Oncology         [phone]) [phone] ________________________________  Name: Rhonda Berg MRN: [id number]  Date: 08/24/2016  DOB: Dec 04, 1940  Follow-Up Visit Note  Outpatient  CC: Copland, Gay Filler, MD  Erroll Luna, MD  Diagnosis and Prior Radiotherapy:    ICD-9-CM ICD-10-CM   1. 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  CHIEF COMPLAINT: Here for follow-up and surveillance of right breast cancer  Narrative:  The patient returns today for routine follow-up of radiation completed on 07/23/16.  On review of systems, she denies pain or fatigue. The patient reports she uses Vitamin E cream and occasionally Neosporin to areas of peeling around the right nipple area.   She is currently taking Exemestane, which she restarted once RT was completed. She is scheduled to follow up with Dr. Burr Medico in June. She has a Survivorship appointment on 10/23/16.  ALLERGIES:  has No Known Allergies.  Meds: Current Outpatient Prescriptions  Medication Sig Dispense Refill  . albuterol (PROVENTIL HFA;VENTOLIN HFA) 108 (90 Base) MCG/ACT inhaler Inhale 2 puffs into the lungs every 6 (six) hours as needed for wheezing or shortness of breath. 1 Inhaler 6  . amantadine (SYMMETREL) 100 MG capsule Take 100 mg by mouth 2 (two) times daily.    [name] aspirin 81 MG tablet Take 81 mg by mouth daily.    . BD PEN NEEDLE NANO U/F 32G X 4 MM MISC Check sugar 2-3 times daily    . Blood Glucose Monitoring Suppl MISC Pt uses one touch verio flex. Please dispense test strips #100 and lancets #100. She tests her sugar 2-3x daily 100 each 3  . divalproex (DEPAKOTE ER) 250 MG 24 hr tablet Take 250 mg by mouth daily.     [name] exemestane (AROMASIN) 25 MG tablet take 1 tablet by mouth AFTER BREAKFAST 30 tablet 2  . furosemide (LASIX) 40 MG tablet take 1 tablet by mouth once daily 30 tablet 6  . gabapentin (NEURONTIN) 600 MG tablet Take 1 tablet (600 mg total) by mouth 4 (four) times daily. 120 tablet 11  . LANTUS SOLOSTAR 100 UNIT/ML Solostar Pen Take 48 units subcutaneously once daily.  Adjust as needed (Patient taking differently: Inject 40 Units into the skin daily before breakfast. Adjust as needed) 15 mL 5  . metFORMIN (GLUCOPHAGE) 1000 MG tablet Take 1 tablet (1,000 mg total) by mouth 2 (two) times daily with a meal. 180 tablet 3  . mirtazapine (REMERON) 15 MG tablet Take 15 mg by mouth at bedtime.    . propranolol (INDERAL) 10 MG tablet take 1 tablet by mouth twice a day 60 tablet 3  . simvastatin (ZOCOR) 40 MG tablet Take 1 tablet (40 mg total) by mouth every evening. 30 tablet 6  . clonazePAM (KLONOPIN) 1 MG tablet      No current facility-administered medications for this encounter.     Physical Findings: The patient is in no acute distress. Patient is alert and oriented.  height is 5' 7"  (1.702 m) and weight is 277 lb 3.2 oz (125.7 kg). Her temperature is 98.9 F (37.2 C). Her blood pressure is 114/83 and her pulse is 74. Her oxygen saturation  is 98%.    Skin is healed well, with some central hyperpigmentation which is continuing to resolve.   Lab Findings: Lab Results  Component Value Date   WBC 5.8 04/18/2016   HGB 14.4 04/18/2016   HCT 45.1 04/18/2016   MCV 89.0 04/18/2016   PLT 185 04/18/2016    Radiographic Findings: No results found.  Impression/Plan:  Patient will continue to apply Vitamin E cream twice a day; she may also choose to use Vitamin E oil to the skin within the treatment field for accelerated healing.  The patient will continue to follow with Dr. Burr Medico as indicated. She will continue with routine yearly mammograms, and monthly self breast exams.  Follow up with me  prn.      Eppie Gibson, MD  This document serves as a record of services personally performed by Eppie Gibson, MD. It was created on her behalf by Maryla Morrow, a trained medical scribe. The creation of this record is based on the scribe's personal observations and the provider's statements to them. This document has been checked and approved by the attending provider.

## 2016-08-24 NOTE — Telephone Encounter (Signed)
LMOM with patient.  Patient has f/u scheduled on Monday that she was confused about.  I spoke with Dr. Burr Medico, and patient is due for follow up regarding her aromatase inhibitors at this point.  I had hoped to discuss this with patient as I can still see her, she can reschedule with Dr. Burr Medico, or we can move up her SCP visit and talk about her anti estrogen therapy as well (must have a few days notice for this option).  Will discuss with patient when she calls back.    Wilber Bihari, NP

## 2016-08-24 NOTE — Progress Notes (Signed)
Ms. Cass is here for follow up of radiation completed 07/23/16 to her Right Breast. She denies pain or fatigue. She is taking exemestane currently, which she started again after radiation completed. She has a survivorship appointment on 10/23/16 and will see Dr. Burr Medico in June. The skin to her Right Breast has healed. She has hyperpigmentation present to her Right Breast. She does have peeling visible to her Right nipple area. She is using vitamin E cream and occasionally neosporin.   BP 114/83   Pulse 74   Temp 98.9 F (37.2 C)   Ht 5\' 7"  (1.702 m)   Wt 277 lb 3.2 oz (125.7 kg)   SpO2 98% Comment: room air  BMI 43.42 kg/m    Wt Readings from Last 3 Encounters:  08/24/16 277 lb 3.2 oz (125.7 kg)  08/02/16 285 lb 6.4 oz (129.5 kg)  07/09/16 274 lb 12.8 oz (124.6 kg)

## 2016-08-27 ENCOUNTER — Ambulatory Visit: Payer: Medicare HMO | Admitting: Adult Health

## 2016-08-27 ENCOUNTER — Other Ambulatory Visit: Payer: Medicare HMO

## 2016-08-27 DIAGNOSIS — Z853 Personal history of malignant neoplasm of breast: Secondary | ICD-10-CM | POA: Diagnosis not present

## 2016-08-28 ENCOUNTER — Telehealth: Payer: Self-pay | Admitting: Family Medicine

## 2016-08-28 NOTE — Telephone Encounter (Signed)
Caller name: Relationship to patient: Self Can be reached: [phone] Pharmacy:  Reason for call: Patient called to speak with provider about letter she received.

## 2016-08-30 MED ORDER — SITAGLIPTIN PHOSPHATE 100 MG PO TABS: 100.0000 mg | ORAL_TABLET | Freq: Every day | ORAL | 5 refills | Status: DC

## 2016-08-30 NOTE — Telephone Encounter (Signed)
Pt called back- she is on metformin and 48 units of lantus.  However, her A1c is much too high  Lab Results  Component Value Date   HGBA1C 9.9 (H) 08/02/2016    She tried invokanna, but was concerned about SE. otherwise she has not really tried any other medications for her DM Will have her try adding Januvia Her renal function is fine and she has no history of pancreatitis   She will add this medication to her regimen and then see [place] in 3 months

## 2016-08-31 ENCOUNTER — Other Ambulatory Visit: Payer: Self-pay | Admitting: Hematology

## 2016-09-05 ENCOUNTER — Telehealth: Payer: Self-pay | Admitting: Hematology

## 2016-09-05 MED ORDER — SITAGLIPTIN PHOSPHATE 100 MG PO TABS: 100.0000 mg | ORAL_TABLET | Freq: Every day | ORAL | 5 refills | Status: AC

## 2016-09-05 NOTE — Telephone Encounter (Addendum)
Patient states she would like to discuss new medication prescribed and would like Rx please send to  [facility], [place] [phone] (Phone) [phone] (Fax)   Best #  [phone]

## 2016-09-05 NOTE — Telephone Encounter (Signed)
Looks like rx sent to mail away- resent to her [facility] on [address].  That is all that she needed

## 2016-09-05 NOTE — Telephone Encounter (Signed)
lvm to inform pt of lab/MD appt 6/8 at 1500 per sch msg

## 2016-09-05 NOTE — Addendum Note (Signed)
Addended by: Lamar Blinks C on: 09/05/2016 05:21 PM   Modules accepted: Orders

## 2016-09-10 NOTE — Anesthesia Postprocedure Evaluation (Signed)
Anesthesia Post Note  Patient: Rhonda Berg  Procedure(s) Performed: Procedure(s) (LRB): RIGHT BREAST LUMPECTOMY WITH RADIOACTIVE SEED AND SENTINEL LYMPH NODE BIOPSY (Right)     Anesthesia Post Evaluation  Last Vitals:  Vitals:   04/25/16 1315 04/25/16 1344  BP: 101/82   Pulse: 66   Resp: 18   Temp:  36.7 C    Last Pain:  Vitals:   04/25/16 1244  TempSrc:   PainSc: 0-No pain                 Dewel Lotter S

## 2016-09-10 NOTE — Addendum Note (Signed)
Addendum  created 09/10/16 1009 by Myrtie Soman, MD   Sign clinical note

## 2016-09-13 NOTE — Progress Notes (Signed)
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 10 year risk of distant recurrence of 4% on Tamoxifen alone.      01/11/2016 Mammogram    Diagnostic mammogram and ultrasound showed multiple small masses in the lower outer quadrant of the right breast, 2 representative mass at 8:00 position measuring 1.6 and the 0.9 cm. The axillary adenopathy.       01/13/2016 Initial Diagnosis    Breast cancer of lower-outer quadrant of right female breast ([place])      01/13/2016 Initial Biopsy    Right breast 8:00 position two biopsies showed invasive ductal carcinoma, and intermediate grade DCIS.      01/13/2016 Receptors her2    ER 100% positive, PR 100% positive, HER-2 negative, Ki-67 12%       03/08/2016 -  Neo-Adjuvant Anti-estrogen oral therapy    Exemestane 25 mg daily      04/25/2016 Pathology Results    Right Lumpectomy and Right SLN Biopsy 04/25/2016 1. Breast, lumpectomy, Right INVASIVE DUCTAL CARCINOMA, GRADE 2, SPANNING 2.7 CM  DUCTAL CARCINOMA IN SITU, GRADE 1 ALL MARGINS OF RESECTION ARE NEGATIVE FOR CARCINOMA 2. Lymph node, sentinel, biopsy,  Right axillary ONE BENIGN LYMPH NODE (0/1) 3. Breast, excision, Right additional anterior margin BENIGN BREAST TISSUE      04/25/2016 Oncotype testing    Oncotype DX score of 2, low risk. 10 year risk of distant recurrence of 4% on Tamoxifen alone.      04/25/2016 Surgery    Right breast lumpectomy with radioactive seed and sentinel lymph node biopsy by Dr. Brantley Stage      06/12/2016 - 07/23/2016 Radiation Therapy    Site/dose:   Right Breast treated to 50 Gy in 25 fractions, then Boosted an additional 10 Gy in 5 fractions      HISTORY OF PRESENTING ILLNESS:  Rhonda Berg 76 y.o. female with past medical history of diabetes, arthritis, bipolar, is here because of her recently diagnosed right breast cancer. She presents to my clinic by herself today.  She has been having routine annual mammogram which has been normal. she felt a right breast lump about one month ago, no significant tenderness, no skin change or nipple discharge. she was seen by her PCP and was sent for diagnostic mammogram and ultrasound, which showed multiple masses in the lower outer quadrant of right breast, the largest one 1.6cm. no axillary adenopathy. 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 She was referred to breast surgeon Dr. Brantley Stage, and was offered lumpectomy and sentinel lymph  node biopsy.   She has chronic back pain, uses a crane to get around. She is married, lives with her husband and daughter, her husband has health issue also, so they move to their daughter's house in 06/2015. She is able to do housework, cooking, laundry, etc. she does not exercise regularly, but remains to be moderately physically active.  She has history of bipolar, depression and anxiety, on 3 medications, and her mood disorder seems to be controlled. She also has type 2 diabetes,  used to be on insulin, now on metformin alone. She has strong family history of breast cancer.   GYN HISTORY  Menarchal: 10 LMP: 30's (she had hysterectomy for fibroids) Contraceptive: none  HRT: none  G2P1: one miscarriage, one daughter who is 23 yo   CURRENT THERAPY: Exemestane since 03/08/16. Held during radiation, and restarted on 07/23/2016  INTERIM HISTORY:  Ms Jupin returns for follow-up. She is one month post radiation and she reports her color is coming back. She denies any other problems from radiation. She has restarted Exemestane and denies any problems.  She denies any joint problems.    MEDICAL HISTORY:  Past Medical History:  Diagnosis Date  . Arthritis   . Bipolar 1 disorder ([place])   . Cancer ([place])    breast  . Depression   . Diabetes mellitus without complication ([place])   . Diabetic neuropathy ([place])   . Dyspnea   . History of radiation therapy 06/12/16- 07/23/16   Right Breast 50 Gy in 25 fractions. Right Breast boost 10Gy in 5 fractions  . Hypercholesteremia   . Hypertension   . Peripheral edema   . Proteinuria   . Sleep apnea    has CPAP but doesnt wear it    SURGICAL HISTORY: Past Surgical History:  Procedure Laterality Date  . ABDOMINAL HYSTERECTOMY    . BREAST LUMPECTOMY WITH RADIOACTIVE SEED AND SENTINEL LYMPH NODE BIOPSY Right 04/25/2016   Procedure: RIGHT BREAST LUMPECTOMY WITH RADIOACTIVE SEED AND SENTINEL LYMPH NODE BIOPSY;  Surgeon: Erroll Luna, MD;  Location: [place];  Service: General;  Laterality: Right;  . COLONOSCOPY    . KNEE SURGERY     bilateral partial knee replacements    SOCIAL HISTORY: Social History   Social History  . Marital status: Married    Spouse name: N/A  . Number of children: N/A  . Years of education: N/A   Occupational History  . Not on file.   Social History Main Topics  . Smoking status: Former Smoker    Packs/day: 0.25    Years: 40.00    Quit date: 04/09/2002  . Smokeless tobacco: Never Used  . Alcohol  use No  . Drug use: No     Comment: when she was in [age]   . Sexual activity: Not on file   Other Topics Concern  . Not on file   Social History Narrative   Retired [employer].     Highest education:  Master's degree   She lives with her daughter and husband.     FAMILY HISTORY: Family History  Problem Relation Age of Onset  . Cancer Mother        breast cancer   . Cancer Sister        breast cancer   . Cancer Maternal Aunt 63       breast cancer   . Cancer Cousin        colon cancer     ALLERGIES:  has No Known  Allergies.  MEDICATIONS:  Current Outpatient Prescriptions  Medication Sig Dispense Refill  . albuterol (PROVENTIL HFA;VENTOLIN HFA) 108 (90 Base) MCG/ACT inhaler Inhale 2 puffs into the lungs every 6 (six) hours as needed for wheezing or shortness of breath. 1 Inhaler 6  . amantadine (SYMMETREL) 100 MG capsule Take 100 mg by mouth 2 (two) times daily.    [name] aspirin 81 MG tablet Take 81 mg by mouth daily.    . BD PEN NEEDLE NANO U/F 32G X 4 MM MISC Check sugar 2-3 times daily    . Blood Glucose Monitoring Suppl MISC Pt uses one touch verio flex. Please dispense test strips #100 and lancets #100. She tests her sugar 2-3x daily 100 each 3  . clonazePAM (KLONOPIN) 1 MG tablet     . divalproex (DEPAKOTE ER) 250 MG 24 hr tablet Take 250 mg by mouth daily.    [name] exemestane (AROMASIN) 25 MG tablet take 1 tablet by mouth once daily AFTER BREAKFAST 30 tablet 2  . furosemide (LASIX) 40 MG tablet take 1 tablet by mouth once daily 30 tablet 6  . gabapentin (NEURONTIN) 600 MG tablet Take 1 tablet (600 mg total) by mouth 4 (four) times daily. 120 tablet 11  . LANTUS SOLOSTAR 100 UNIT/ML Solostar Pen Take 48 units subcutaneously once daily.  Adjust as needed (Patient taking differently: Inject 40 Units into the skin daily before breakfast. Adjust as needed) 15 mL 5  . metFORMIN (GLUCOPHAGE) 1000 MG tablet Take 1 tablet (1,000 mg total) by mouth 2 (two) times daily with a  meal. 180 tablet 3  . mirtazapine (REMERON) 15 MG tablet Take 15 mg by mouth at bedtime.    . propranolol (INDERAL) 10 MG tablet take 1 tablet by mouth twice a day 60 tablet 3  . simvastatin (ZOCOR) 40 MG tablet Take 1 tablet (40 mg total) by mouth every evening. 30 tablet 6  . sitaGLIPtin (JANUVIA) 100 MG tablet Take 1 tablet (100 mg total) by mouth daily. 30 tablet 5   No current facility-administered medications for this visit.     REVIEW OF SYSTEMS:   Constitutional: Denies fevers, chills or abnormal night sweats Eyes: Denies blurriness of vision, double vision or watery eyes Ears, nose, mouth, throat, and face: Denies mucositis or sore throat Respiratory: Denies cough (+) dyspnea on exertion Cardiovascular: Denies palpitation, chest discomfort or lower extremity swelling Gastrointestinal:  Denies nausea, heartburn or change in bowel habits Skin: Denies abnormal skin rashes Lymphatics: Denies new lymphadenopathy or easy bruising Neurological:Denies numbness, tingling or new weaknesses Behavioral/Psych: Mood is stable, no new changes  All other systems were reviewed with the patient and are negative.  PHYSICAL EXAMINATION:  ECOG PERFORMANCE STATUS: 1 - Symptomatic but completely ambulatory  Vitals:   09/14/16 1516  BP: 109/66  Pulse: 91  Resp: 18  Temp: 98.4 F (36.9 C)  TempSrc: Oral  SpO2: 93%  Weight: 284 lb 8 oz (129 kg)  Height: 5' 7"  (1.702 m)    GENERAL:alert, no distress and comfortable SKIN: skin color, texture, turgor are normal, no rashes or significant lesions EYES: normal, conjunctiva are pink and non-injected, sclera clear OROPHARYNX:no exudate, no erythema and lips, buccal mucosa, and tongue normal  NECK: supple, thyroid normal size, non-tender, without nodularity LYMPH:  no palpable lymphadenopathy in the cervical, axillary or inguinal LUNGS: clear to auscultation and percussion with normal breathing effort HEART: regular rate & rhythm and no murmurs  and no lower extremity edema ABDOMEN:abdomen soft, non-tender and normal bowel  sounds Musculoskeletal:no cyanosis of digits and no clubbing  PSYCH: alert & oriented x 3 with fluent speech NEURO: no focal motor/sensory deficits Breasts: Breast inspection showed a right axillary incision and right breast incision, both have healed well, palpitation of bilateral breast and axilla show no mass or adenopathy.  LABORATORY DATA:  I have reviewed the data as listed CBC Latest Ref Rng & Units 09/14/2016 04/18/2016 04/13/2016  WBC 3.9 - 10.3 10e3/uL 5.5 5.8 6.3  Hemoglobin 11.6 - 15.9 g/dL 14.6 14.4 14.2  Hematocrit 34.8 - 46.6 % 44.4 45.1 43.9  Platelets 145 - 400 10e3/uL 186 185 195   CMP Latest Ref Rng & Units 09/14/2016 04/18/2016 04/13/2016  Glucose 70 - 140 mg/dl 249(H) 87 142(H)  BUN 7.0 - 26.0 mg/dL 11.7 14 13.6  Creatinine 0.6 - 1.1 mg/dL 0.9 0.88 0.9  Sodium 136 - 145 mEq/L 142 142 144  Potassium 3.5 - 5.1 mEq/L 4.7 4.1 4.4  Chloride 101 - 111 mmol/L - 103 -  CO2 22 - 29 mEq/L 30(H) 28 30(H)  Calcium 8.4 - 10.4 mg/dL 9.8 9.3 9.6  Total Protein 6.4 - 8.3 g/dL 7.2 - 7.5  Total Bilirubin 0.20 - 1.20 mg/dL 0.23 - 0.22  Alkaline Phos 40 - 150 U/L 81 - 78  AST 5 - 34 U/L 15 - 16  ALT 0 - 55 U/L 16 - 17   Pathology Report Oncotype DX 04/25/2016   Diagnosis 04/25/2016 1. Breast, lumpectomy, Right INVASIVE DUCTAL CARCINOMA, GRADE 2, SPANNING 2.7 CM DUCTAL CARCINOMA IN SITU, GRADE 1 ALL MARGINS OF RESECTION ARE NEGATIVE FOR CARCINOMA 2. Lymph node, sentinel, biopsy, Right axillary ONE BENIGN LYMPH NODE (0/1) 3. Breast, excision, Right additional anterior margin BENIGN BREAST TISSUE Microscopic Comment 1. BREAST, INVASIVE TUMOR Procedure: Lumpectomy Laterality: Right Tumor Size: 2.7 cm Histologic Type: Ductal carcinoma Grade: 2 Tubular Differentiation: 2 Nuclear Pleomorphism: 2 Mitotic Count: 2 Ductal Carcinoma in Situ (DCIS): Present Extent of Tumor: Skin: Negative Nipple:  Negative Skeletal muscle: Negative Margins: Invasive carcinoma, distance from closest margin: 0.5 cm from inferior margins DCIS, distance from closest margin: 0.1 mm from lateral, posterior and inferior margin Regional Lymph Nodes: Number of Lymph Nodes Examined: 1 Number of Sentinel Lymph Nodes Examined: 1 Lymph Nodes with Macrometastases: 0 Lymph Nodes with Micrometastases: 0 1 of 3 FINAL for JESIKA, MEN ([id number]) Microscopic Comment(continued) Lymph Nodes with Isolated Tumor Cells: 0 Breast Prognostic Profile: [id number] Estrogen Receptor: 100% Progesterone Receptor: 100% Her2: Negative Ki-67: 12% Pathologic Stage Classification (pTNM, AJCC 8th Edition): Primary Tumor (pT): pT2 Regional Lymph Nodes (pN): pN0 Distant Metastases (pM): x Casimer Lanius MD Pathologist, Electronic Signature (Case signed 04/26/2016) IAGNOSIS Diagnosis 01/13/2016 1. 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 2. 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 Microscopic Comment 1. and 2. While grading is best performed one the resection specimen, the invasive and in situ carcinoma appear grade 2. Prognostic markers will be ordered. Dr. Saralyn Pilar has reviewed the case. The case was called to [facility] on 01/16/2016. Results: IMMUNOHISTOCHEMICAL AND MORPHOMETRIC ANALYSIS PERFORMED MANUALLY Estrogen Receptor: 100%, POSITIVE, STRONG STAINING INTENSITY Progesterone Receptor: 100%, POSITIVE, STRONG STAINING INTENSITY Proliferation Marker Ki67: 12% Results: HER2 - NEGATIVE RATIO OF HER2/CEP17 SIGNALS 1.62 AVERAGE HER2 COPY NUMBER PER CELL 3.65    RADIOGRAPHIC STUDIES: I have personally  reviewed the radiological images as listed and agreed with the findings in the  report. No results found.  ASSESSMENT & PLAN:  76 y.o. post menopause Caucasian female, with past medical history of diabetes, arthritis, bipolar, presented with a palpable right breast masses  1. 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 The margins were clear. 1 biopsied right axillary sentinel lymph node was negative.  -the Oncotype Dx result was reviewed with her in details. She has low risk based on the recurrence score, which predicts 10 year distant recurrence after 5 years of tamoxifen 4%. There is no benefit of adjuvant chemotherapy in low risk disease, I do not recommend adjuvant chemotherapy. She is quite happy to hear the news. -She has completed adjuvant breast radiation -She is on adjuvant Exemestane, which was held during radiation. She has completed radiation and restarted exemestane, she is tolerating well, we'll continue for 5 years. -We discussed breast cancer surveillance, she will continue annual screening mammogram, I encouraged her to do self exam, and follow-up with [place] routinely. - she is due for mammogram and DEXA scan in 01/2017 - f/u in 3 months   2. DM -Much improved lately, she will continue follow-up with her primary care physician - Glucose 249 today (6/8). I strongly encouraged her to watch her diet   3. Bipolar disorder -Seem to be well controlled, continue medication  4. Arthritis -She has mild chronic back pain and knee pain, tolerable, she uses a cane  5. Dyspnea -Previously encouraged her to follow up with her PCP regarding this.   PLAN -Lab reviewed. She will continue exemestane - request mammogram and DEXA scan to be done in 01/2017 - f/u in 3 months    All questions were  answered. The patient knows to call the clinic with any problems, questions or concerns.  I spent 25 minutes counseling the patient face to face. The total time spent in the appointment was 30 minutes and more than 50% was on counseling.     Truitt Merle, MD 09/14/2016   This document serves as a record of services personally performed by Truitt Merle, MD. It was created on her behalf by Brandt Loosen, a trained medical scribe. The creation of this record is based on the scribe's personal observations and the provider's statements to them. This document has been checked and approved by the attending provider.

## 2016-09-14 ENCOUNTER — Other Ambulatory Visit (HOSPITAL_BASED_OUTPATIENT_CLINIC_OR_DEPARTMENT_OTHER): Payer: Medicare HMO

## 2016-09-14 ENCOUNTER — Ambulatory Visit (HOSPITAL_BASED_OUTPATIENT_CLINIC_OR_DEPARTMENT_OTHER): Payer: Medicare HMO | Admitting: Hematology

## 2016-09-14 ENCOUNTER — Telehealth: Payer: Self-pay | Admitting: Hematology

## 2016-09-14 VITALS — BP 109/66 | HR 91 | Temp 98.4°F | Resp 18 | Ht 67.0 in | Wt 284.5 lb

## 2016-09-14 DIAGNOSIS — G8929 Other chronic pain: Secondary | ICD-10-CM

## 2016-09-14 DIAGNOSIS — E1142 Type 2 diabetes mellitus with diabetic polyneuropathy: Secondary | ICD-10-CM

## 2016-09-14 DIAGNOSIS — Z170 Estrogen receptor positive status [ER+]: Secondary | ICD-10-CM

## 2016-09-14 DIAGNOSIS — C50511 Malignant neoplasm of lower-outer quadrant of right female breast: Secondary | ICD-10-CM | POA: Diagnosis not present

## 2016-09-14 DIAGNOSIS — M549 Dorsalgia, unspecified: Secondary | ICD-10-CM | POA: Diagnosis not present

## 2016-09-14 DIAGNOSIS — F319 Bipolar disorder, unspecified: Secondary | ICD-10-CM

## 2016-09-14 DIAGNOSIS — E119 Type 2 diabetes mellitus without complications: Secondary | ICD-10-CM | POA: Diagnosis not present

## 2016-09-14 DIAGNOSIS — R0600 Dyspnea, unspecified: Secondary | ICD-10-CM

## 2016-09-14 DIAGNOSIS — M1990 Unspecified osteoarthritis, unspecified site: Secondary | ICD-10-CM

## 2016-09-14 DIAGNOSIS — Z79811 Long term (current) use of aromatase inhibitors: Secondary | ICD-10-CM | POA: Diagnosis not present

## 2016-09-14 LAB — COMPREHENSIVE METABOLIC PANEL
ALT: 16 U/L (ref 0–55)
AST: 15 U/L (ref 5–34)
Albumin: 3.7 g/dL (ref 3.5–5.0)
Alkaline Phosphatase: 81 U/L (ref 40–150)
Anion Gap: 12 mEq/L — ABNORMAL HIGH (ref 3–11)
BILIRUBIN TOTAL: 0.23 mg/dL (ref 0.20–1.20)
BUN: 11.7 mg/dL (ref 7.0–26.0)
CHLORIDE: 100 meq/L (ref 98–109)
CO2: 30 meq/L — AB (ref 22–29)
CREATININE: 0.9 mg/dL (ref 0.6–1.1)
Calcium: 9.8 mg/dL (ref 8.4–10.4)
EGFR: 70 mL/min/{1.73_m2} — ABNORMAL LOW (ref >90)
Glucose: 249 mg/dl — ABNORMAL HIGH (ref 70–140)
Potassium: 4.7 mEq/L (ref 3.5–5.1)
Sodium: 142 mEq/L (ref 136–145)
TOTAL PROTEIN: 7.2 g/dL (ref 6.4–8.3)

## 2016-09-14 LAB — CBC WITH DIFFERENTIAL/PLATELET
BASO%: 0.8 % (ref 0.0–2.0)
Basophils Absolute: 0 10*3/uL (ref 0.0–0.1)
EOS%: 1.3 % (ref 0.0–7.0)
Eosinophils Absolute: 0.1 10*3/uL (ref 0.0–0.5)
HEMATOCRIT: 44.4 % (ref 34.8–46.6)
HGB: 14.6 g/dL (ref 11.6–15.9)
LYMPH#: 2.3 10*3/uL (ref 0.9–3.3)
LYMPH%: 41.7 % (ref 14.0–49.7)
MCH: 29.2 pg (ref 25.1–34.0)
MCHC: 32.8 g/dL (ref 31.5–36.0)
MCV: 89 fL (ref 79.5–101.0)
MONO#: 0.6 10*3/uL (ref 0.1–0.9)
MONO%: 10.7 % (ref 0.0–14.0)
NEUT%: 45.5 % (ref 38.4–76.8)
NEUTROS ABS: 2.5 10*3/uL (ref 1.5–6.5)
PLATELETS: 186 10*3/uL (ref 145–400)
RBC: 4.99 10*6/uL (ref 3.70–5.45)
RDW: 14.9 % — ABNORMAL HIGH (ref 11.2–14.5)
WBC: 5.5 10*3/uL (ref 3.9–10.3)

## 2016-09-14 NOTE — Telephone Encounter (Signed)
Appointments scheduled per 09/14/16 los. Patient was given a copy of the AVS report and appointment schedule, per 09/14/16 los.

## 2016-09-16 ENCOUNTER — Encounter: Payer: Self-pay | Admitting: Hematology

## 2016-10-11 DIAGNOSIS — F330 Major depressive disorder, recurrent, mild: Secondary | ICD-10-CM | POA: Diagnosis not present

## 2016-10-15 ENCOUNTER — Ambulatory Visit (HOSPITAL_COMMUNITY)
Admission: RE | Admit: 2016-10-15 | Discharge: 2016-10-15 | Disposition: A | Discharge status: Home / Self Care | Payer: Medicare HMO | Attending: Psychiatry | Admitting: Psychiatry

## 2016-10-15 DIAGNOSIS — E1140 Type 2 diabetes mellitus with diabetic neuropathy, unspecified: Secondary | ICD-10-CM | POA: Diagnosis not present

## 2016-10-15 DIAGNOSIS — I10 Essential (primary) hypertension: Secondary | ICD-10-CM | POA: Insufficient documentation

## 2016-10-15 DIAGNOSIS — E7800 Pure hypercholesterolemia, unspecified: Secondary | ICD-10-CM | POA: Diagnosis not present

## 2016-10-15 DIAGNOSIS — F319 Bipolar disorder, unspecified: Secondary | ICD-10-CM | POA: Diagnosis not present

## 2016-10-15 DIAGNOSIS — Z803 Family history of malignant neoplasm of breast: Secondary | ICD-10-CM | POA: Insufficient documentation

## 2016-10-15 DIAGNOSIS — Z853 Personal history of malignant neoplasm of breast: Secondary | ICD-10-CM | POA: Diagnosis not present

## 2016-10-15 DIAGNOSIS — G4730 Sleep apnea, unspecified: Secondary | ICD-10-CM | POA: Diagnosis not present

## 2016-10-15 DIAGNOSIS — M1990 Unspecified osteoarthritis, unspecified site: Secondary | ICD-10-CM | POA: Insufficient documentation

## 2016-10-15 DIAGNOSIS — Z923 Personal history of irradiation: Secondary | ICD-10-CM | POA: Insufficient documentation

## 2016-10-15 DIAGNOSIS — Z90710 Acquired absence of both cervix and uterus: Secondary | ICD-10-CM | POA: Insufficient documentation

## 2016-10-15 DIAGNOSIS — Z87891 Personal history of nicotine dependence: Secondary | ICD-10-CM | POA: Diagnosis not present

## 2016-10-15 NOTE — H&P (Signed)
Behavioral Health Medical Screening Exam  Rhonda Berg is an 76 y.o. female who arrived voluntarily to [facility] with complaints of depression over family dymamics.   Total Time spent with patient: 30 minutes  Psychiatric Specialty Exam: Physical Exam  Constitutional: She is oriented to person, place, and time. She appears well-developed and well-nourished.  HENT:  Head: Normocephalic and atraumatic.  Eyes: Pupils are equal, round, and reactive to light.  Neck: Normal range of motion.  Cardiovascular: Normal rate and regular rhythm.   Respiratory: Effort normal and breath sounds normal.  GI: Soft. Bowel sounds are normal.  Genitourinary:  Genitourinary Comments: Deferred  Musculoskeletal: Normal range of motion.  Neurological: She is alert and oriented to person, place, and time.  Skin: Skin is warm and dry.    Review of Systems  Psychiatric/Behavioral: Positive for depression. Negative for hallucinations, memory loss, substance abuse and suicidal ideas. The patient is not nervous/anxious and does not have insomnia.   All other systems reviewed and are negative.   Blood pressure 132/76, pulse 75, temperature 99.5 F (37.5 C), temperature source Oral, resp. rate 18, SpO2 97 %.There is no height or weight on file to calculate BMI.  General Appearance: Neat and Well Groomed  Eye Contact:  Fair  Speech:  Clear and Coherent and Normal Rate  Volume:  Normal  Mood:  Angry, Depressed and Tearful  Affect:  Depressed and Tearful  Thought Process:  Coherent and Goal Directed  Orientation:  Full (Time, Place, and Person)  Thought Content:  WDL and Logical  Suicidal Thoughts:  No  Homicidal Thoughts:  No  Memory:  Immediate;   Good Recent;   Good Remote;   Fair  Judgement:  Intact  Insight:  Present  Psychomotor Activity:  Normal  Concentration: Concentration: Good and Attention Span: Good  Recall:  Good  Fund of Knowledge:Good  Language: Good  Akathisia:  Negative  Handed:  Right   AIMS (if indicated):     Assets:  Communication Skills Desire for Improvement Financial Resources/Insurance Housing Leisure Time  Sleep:       Musculoskeletal: Strength & Muscle Tone: within normal limits Gait & Station: steady with cane Patient leans: N/A  Blood pressure 132/76, pulse 75, temperature 99.5 F (37.5 C), temperature source Oral, resp. rate 18, SpO2 97 %.  Recommendations: Based on my evaluation the patient does not appear to have an emergency medical condition.  Patient was provided with OP resources and she agrees to follow up as recommended.  Vicenta Aly, NP 10/15/2016, 12:27 PM

## 2016-10-15 NOTE — BH Assessment (Signed)
Assessment Note  Rhonda Berg is a 76 y.o. female who presents voluntarily to [facility] as a walk in, referred by her psychiatrist, Dr. Casimiro Needle. Pt shares that she and husband moved to [place] from [place] @ 15 months ago b/c her husband is really sick and she needed help to care for him. They are living with their daughter, son-in-law, and grandson. Pt reports everything starting to be "overwhelming". Pt reports not receiving any help with her husband and feeling attacked by family. Pt reports having had thoughts to just "take them out" and also thoughts to just leave them all and go somewhere on her own. Pt denies any hx of violence and any real intent to harm others or herself. Pt is scheduled to see a therapist next week.    Diagnosis: Bipolar I, by hx  Past Medical History:  Past Medical History:  Diagnosis Date  . Arthritis   . Bipolar 1 disorder ([place])   . Cancer ([place])    breast  . Depression   . Diabetes mellitus without complication ([place])   . Diabetic neuropathy ([place])   . Dyspnea   . History of radiation therapy 06/12/16- 07/23/16   Right Breast 50 Gy in 25 fractions. Right Breast boost 10Gy in 5 fractions  . Hypercholesteremia   . Hypertension   . Peripheral edema   . Proteinuria   . Sleep apnea    has CPAP but doesnt wear it    Past Surgical History:  Procedure Laterality Date  . ABDOMINAL HYSTERECTOMY    . BREAST LUMPECTOMY WITH RADIOACTIVE SEED AND SENTINEL LYMPH NODE BIOPSY Right 04/25/2016   Procedure: RIGHT BREAST LUMPECTOMY WITH RADIOACTIVE SEED AND SENTINEL LYMPH NODE BIOPSY;  Surgeon: Erroll Luna, MD;  Location: [facility];  Service: General;  Laterality: Right;  . COLONOSCOPY    . KNEE SURGERY     bilateral partial knee replacements    Family History:  Family History  Problem Relation Age of Onset  . Cancer Mother        breast cancer   . Cancer Sister        breast cancer   . Cancer Maternal Aunt 63       breast cancer   . Cancer Cousin        colon  cancer     Social History:  reports that she quit smoking about 14 years ago. She has a 10.00 pack-year smoking history. She has never used smokeless tobacco. She reports that she does not drink alcohol or use drugs.  Additional Social History:  Alcohol / Drug Use Pain Medications: see PTA meds Prescriptions: see PTA meds Over the Counter: see PTA meds History of alcohol / drug use?: No history of alcohol / drug abuse  CIWA: CIWA-Ar BP: 132/76 Pulse Rate: 75 COWS:    Allergies: No Known Allergies  Home Medications:  (Not in a hospital admission)  OB/GYN Status:  No LMP recorded. Patient has had a hysterectomy.  General Assessment Data Location of Assessment: [facility] Assessment Services TTS Assessment: In system Is this a Tele or Face-to-Face Assessment?: Face-to-Face Is this an Initial Assessment or a Re-assessment for this encounter?: Initial Assessment Marital status: Married Is patient pregnant?: No Pregnancy Status: No Living Arrangements: Spouse/significant other, Children, Other relatives Can pt return to current living arrangement?: Yes Admission Status: Voluntary Is patient capable of signing voluntary admission?: Yes Referral Source: Self/Family/Friend Insurance type: [employer] Medicare  Medical Screening Exam ([place]) Medical Exam completed: Yes  Crisis  Care Plan Living Arrangements: Spouse/significant other, Children, Other relatives Name of Psychiatrist: Dr. Remi Haggard Name of Therapist: [facility] (appt pending)  Education Status Is patient currently in school?: No  Risk to self with the past 6 months Suicidal Ideation: No Has patient been a risk to self within the past 6 months prior to admission? : No Suicidal Intent: No Has patient had any suicidal intent within the past 6 months prior to admission? : No Is patient at risk for suicide?: No Suicidal Plan?: No Has patient had any suicidal plan within the past 6 months prior to  admission? : No Access to Means: No What has been your use of drugs/alcohol within the last 12 months?: pt denies Previous Attempts/Gestures: Yes How many times?: 1 Triggers for Past Attempts: Unknown Intentional Self Injurious Behavior: None Family Suicide History: Unknown Recent stressful life event(s): Conflict (Comment) Persecutory voices/beliefs?: No Depression: Yes Depression Symptoms: Feeling angry/irritable, Feeling worthless/self pity, Guilt, Insomnia Substance abuse history and/or treatment for substance abuse?: No Suicide prevention information given to non-admitted patients: Not applicable  Risk to Others within the past 6 months Homicidal Ideation: No Does patient have any lifetime risk of violence toward others beyond the six months prior to admission? : No Thoughts of Harm to Others: No Current Homicidal Intent: No Current Homicidal Plan: No Access to Homicidal Means: No History of harm to others?: No Assessment of Violence: None Noted Does patient have access to weapons?: No Criminal Charges Pending?: No Does patient have a court date: No Is patient on probation?: No  Psychosis Hallucinations: None noted Delusions: None noted  Mental Status Report Appearance/Hygiene: Unremarkable Eye Contact: Poor Motor Activity: Unremarkable Speech: Logical/coherent Level of Consciousness: Alert Mood: Sad, Irritable, Helpless Affect: Sad, Irritable Anxiety Level: None Thought Processes: Coherent, Relevant Judgement: Partial Orientation: Person, Place, Time, Situation Obsessive Compulsive Thoughts/Behaviors: None  Cognitive Functioning Concentration: Normal Memory: Recent Intact, Remote Intact IQ: Average Insight: Fair Impulse Control: Unable to Assess Appetite: Good Sleep: Decreased Total Hours of Sleep: 0 Vegetative Symptoms: None  ADLScreening [facility] Assessment Services) Patient's cognitive ability adequate to safely complete daily activities?: Yes Patient  able to express need for assistance with ADLs?: Yes Independently performs ADLs?: Yes (appropriate for developmental age)  Prior Inpatient Therapy Prior Inpatient Therapy: Yes Prior Therapy Dates: "years ago" Reason for Treatment: suicide attempt  Prior Outpatient Therapy Prior Outpatient Therapy: No Does patient have an ACCT team?: No Does patient have Intensive In-House Services?  : No Does patient have [facility] services? : No Does patient have P4CC services?: No  ADL Screening (condition at time of admission) Patient's cognitive ability adequate to safely complete daily activities?: Yes Is the patient deaf or have difficulty hearing?: No Does the patient have difficulty seeing, even when wearing glasses/contacts?: No Does the patient have difficulty concentrating, remembering, or making decisions?: No Patient able to express need for assistance with ADLs?: Yes Does the patient have difficulty dressing or bathing?: No Independently performs ADLs?: Yes (appropriate for developmental age) Does the patient have difficulty walking or climbing stairs?: Yes Weakness of Legs: None Weakness of Arms/Hands: None  Home Assistive Devices/Equipment Home Assistive Devices/Equipment: Cane (specify quad or straight)    Abuse/Neglect Assessment (Assessment to be complete while patient is alone) Physical Abuse: Denies Verbal Abuse: Denies Sexual Abuse: Denies Exploitation of patient/patient's resources: Denies Self-Neglect: Denies Values / Beliefs Cultural Requests During Hospitalization: None Spiritual Requests During Hospitalization: None Consults Spiritual Care Consult Needed: No Social Work Consult Needed: No Regulatory affairs officer (  For Healthcare) Does Patient Have a Medical Advance Directive?: No Would patient like information on creating a medical advance directive?: No - Patient declined Nutrition Screen- [facility] Adult/WL/AP Patient's home diet: Regular Has the patient recently lost  weight without trying?: No Has the patient been eating poorly because of a decreased appetite?: No Malnutrition Screening Tool Score: 0  Additional Information 1:1 In Past 12 Months?: No CIRT Risk: No Elopement Risk: No Does patient have medical clearance?: Yes     Disposition:  Disposition Initial Assessment Completed for this Encounter: Yes (consulted with Dr. Dwyane Dee) Disposition of Patient: Outpatient treatment Type of outpatient treatment: Adult (Pt recommended to follow up with OP therapy.)  On Site Evaluation by:   Reviewed with Physician:    Rexene Edison 10/15/2016 1:15 PM

## 2016-10-22 NOTE — Progress Notes (Signed)
CLINIC:  Survivorship   REASON FOR VISIT:  Routine follow-up post-treatment for a recent history of breast cancer.  BRIEF ONCOLOGIC HISTORY:  Oncology History   Cancer Staging Breast cancer of lower-outer quadrant of right female breast [facility]) Staging form: Breast, AJCC 7th Edition - Clinical stage from 01/13/2016: Stage IA (T1c(m), N0, M0) - Signed by Truitt Merle, MD on 01/30/2016 - Pathologic stage from 04/25/2016: Stage IIA (T2, N0, cM0) - Signed by Truitt Merle, MD on 05/18/2016       Breast cancer of lower-outer quadrant of right female breast ([place])   05/27/2015 Oncotype testing    Oncotype DX score of 2, low risk. 10 year risk of distant recurrence of 4% on Tamoxifen alone.      01/11/2016 Mammogram    Diagnostic mammogram and ultrasound showed multiple small masses in the lower outer quadrant of the right breast, 2 representative mass at 8:00 position measuring 1.6 and the 0.9 cm. The axillary adenopathy.       01/13/2016 Initial Diagnosis    Breast cancer of lower-outer quadrant of right female breast ([place])      01/13/2016 Initial Biopsy    Right breast 8:00 position two biopsies showed invasive ductal carcinoma, and intermediate grade DCIS.      01/13/2016 Receptors her2    ER 100% positive, PR 100% positive, HER-2 negative, Ki-67 12%       03/08/2016 -  Neo-Adjuvant Anti-estrogen oral therapy    Exemestane 25 mg daily      04/25/2016 Pathology Results    Right Lumpectomy and Right SLN Biopsy 04/25/2016 1. Breast, lumpectomy, Right INVASIVE DUCTAL CARCINOMA, GRADE 2, SPANNING 2.7 CM  DUCTAL CARCINOMA IN SITU, GRADE 1 ALL MARGINS OF RESECTION ARE NEGATIVE FOR CARCINOMA 2. Lymph node, sentinel, biopsy, Right axillary ONE BENIGN LYMPH NODE (0/1) 3. Breast, excision, Right additional anterior margin BENIGN BREAST TISSUE      04/25/2016 Oncotype testing    Oncotype DX score of 2, low risk. 10 year risk of distant recurrence of 4% on Tamoxifen alone.      04/25/2016  Surgery    Right breast lumpectomy with radioactive seed and sentinel lymph node biopsy by Dr. Brantley Stage      06/12/2016 - 07/23/2016 Radiation Therapy    Site/dose:   Right Breast treated to 50 Gy in 25 fractions, then Boosted an additional 10 Gy in 5 fractions       INTERVAL HISTORY:  Ms. Maclachlan presents to the [facility] today for our initial meeting to review her survivorship care plan detailing her treatment course for breast cancer, as well as monitoring long-term side effects of that treatment, education regarding health maintenance, screening, and overall wellness and health promotion.     Overall, Ms. Kohler reports feeling quite well.  She is taking Exemestane daily and other than hot flashes she is tolerating it well.  She has a h/o bipolar disorder but denies any current issues or recent issues with this.  She sees a psychiatrist and tells me she is stable on her medication.     REVIEW OF SYSTEMS:  Review of Systems  Constitutional: Negative for appetite change, chills, fatigue, fever and unexpected weight change.  HENT:   Negative for hearing loss and lump/mass.   Eyes: Negative for eye problems and icterus.  Respiratory: Negative for chest tightness, cough and shortness of breath.   Cardiovascular: Negative for chest pain, leg swelling and palpitations.  Gastrointestinal: Negative for abdominal distention and abdominal pain.  Endocrine: Positive  for hot flashes.  Genitourinary: Negative for difficulty urinating.   Musculoskeletal: Negative for arthralgias.  Skin: Negative for itching and rash.  Neurological: Negative for dizziness, extremity weakness, headaches and numbness.  Hematological: Negative for adenopathy. Does not bruise/bleed easily.  Psychiatric/Behavioral: Positive for depression (chonic and managed). The patient is not nervous/anxious.    Breast: Denies any new nodularity, masses, tenderness, nipple changes, or nipple discharge.      ONCOLOGY  TREATMENT TEAM:  1. Surgeon:  Dr[facility] Br[facility]ley Stage at [facility] 2. Medical Oncologist: Dr. Burr Medico  3. Radiation Oncologist: Dr. Isidore Moos    PAST MEDICAL/SURGICAL HISTORY:  Past Medical History:  Diagnosis Date  . Arthritis   . Bipolar 1 disorder ([place])   . Cancer ([place])    breast  . Depression   . Diabetes mellitus without complication ([place])   . Diabetic neuropathy ([place])   . Dyspnea   . History of radiation therapy 06/12/16- 07/23/16   Right Breast 50 Gy in 25 fractions. Right Breast boost 10Gy in 5 fractions  . Hypercholesteremia   . Hypertension   . Peripheral edema   . Proteinuria   . Sleep apnea    has CPAP but doesnt wear it   Past Surgical History:  Procedure Laterality Date  . ABDOMINAL HYSTERECTOMY    . BREAST LUMPECTOMY WITH RADIOACTIVE SEED AND SENTINEL LYMPH NODE BIOPSY Right 04/25/2016   Procedure: RIGHT BREAST LUMPECTOMY WITH RADIOACTIVE SEED AND SENTINEL LYMPH NODE BIOPSY;  Surgeon: Erroll Luna, MD;  Location: [place];  Service: General;  Laterality: Right;  . COLONOSCOPY    . KNEE SURGERY     bilateral partial knee replacements     ALLERGIES:  No Known Allergies   CURRENT MEDICATIONS:  Outpatient Encounter Prescriptions as of 10/23/2016  Medication Sig Note  . albuterol (PROVENTIL HFA;VENTOLIN HFA) 108 (90 Base) MCG/ACT inhaler Inhale 2 puffs into the lungs every 6 (six) hours as needed for wheezing or shortness of breath.   [name] amantadine (SYMMETREL) 100 MG capsule Take 100 mg by mouth 2 (two) times daily.   [name] aspirin 81 MG tablet Take 81 mg by mouth daily.   . BD PEN NEEDLE NANO U/F 32G X 4 MM MISC Check sugar 2-3 times daily 02/20/2016: Received from: External Pharmacy  . Blood Glucose Monitoring Suppl MISC Pt uses one touch verio flex. Please dispense test strips #100 and lancets #100. She tests her sugar 2-3x daily   . clonazePAM (KLONOPIN) 1 MG tablet    . divalproex (DEPAKOTE ER) 250 MG 24 hr tablet Take 250 mg by mouth daily.   [name] exemestane  (AROMASIN) 25 MG tablet take 1 tablet by mouth once daily AFTER BREAKFAST   . furosemide (LASIX) 40 MG tablet take 1 tablet by mouth once daily   . gabapentin (NEURONTIN) 600 MG tablet Take 1 tablet (600 mg total) by mouth 4 (four) times daily.   [name] LANTUS SOLOSTAR 100 UNIT/ML Solostar Pen Take 48 units subcutaneously once daily.  Adjust as needed (Patient taking differently: Inject 40 Units into the skin daily before breakfast. Adjust as needed)   . metFORMIN (GLUCOPHAGE) 1000 MG tablet Take 1 tablet (1,000 mg total) by mouth 2 (two) times daily with a meal.   . mirtazapine (REMERON) 15 MG tablet Take 15 mg by mouth at bedtime.   . propranolol (INDERAL) 10 MG tablet take 1 tablet by mouth twice a day   . simvastatin (ZOCOR) 40 MG tablet Take 1 tablet (40 mg total) by mouth every evening.   [name]  sitaGLIPtin (JANUVIA) 100 MG tablet Take 1 tablet (100 mg total) by mouth daily.    No facility-administered encounter medications on file as of 10/23/2016.      ONCOLOGIC FAMILY HISTORY:  Family History  Problem Relation Age of Onset  . Cancer Mother        breast cancer   . Cancer Sister        breast cancer   . Cancer Maternal Aunt 63       breast cancer   . Cancer Cousin        colon cancer      GENETIC COUNSELING/TESTING: Sent referral today  SOCIAL HISTORY:  KYNA BLAHNIK is married and lives with her husband and her daughter in [place], [place]. Her husband is ill and they live with their daughter so she can have assistance in taking care of him.  Ms. Burd is currently retired.  She denies any current or history of tobacco, alcohol, or illicit drug use.     PHYSICAL EXAMINATION:  Vital Signs:   Vitals:   10/23/16 1006  BP: 138/89  Pulse: 88  Resp: 20  Temp: 98.2 F (36.8 C)   Filed Weights   10/23/16 1006  Weight: 288 lb 1.6 oz (130.7 kg)   General: Well-nourished, well-appearing female in no acute distress.  She is unaccompanied today.   HEENT: Head is  normocephalic.  Pupils equal and reactive to light. Conjunctivae clear without exudate.  Sclerae anicteric. Oral mucosa is pink, moist.  Oropharynx is pink without lesions or erythema.  Lymph: No cervical, supraclavicular, or infraclavicular lymphadenopathy noted on palpation.  Cardiovascular: Regular rate and rhythm.[name] Respiratory: Clear to auscultation bilaterally. Chest expansion symmetric; breathing non-labored.  GI: Abdomen soft and round; non-tender, non-distended. Bowel sounds normoactive.  GU: Deferred.  Neuro: No focal deficits. Steady gait.  Psych: Mood and affect normal and appropriate for situation.  Extremities: No edema. MSK: No focal spinal tenderness to palpation.  Full range of motion in bilateral upper extremities Skin: Warm and dry.  LABORATORY DATA:  None for this visit.  DIAGNOSTIC IMAGING:  None for this visit.      ASSESSMENT AND PLAN:  Ms.. Lien is a pleasant 76 y.o. female with Stage IIA right breast invasive ductal carcinoma, ER+/PR+/HER2-, diagnosed in 01/2016, treated with lumpectomy, adjuvant radiation therapy, and anti-estrogen therapy with Exemestane beginning in 02/2016.  She presents to the Survivorship Clinic for our initial meeting and routine follow-up post-completion of treatment for breast cancer.    1. Stage II right breast cancer:  Ms. Leitzke is continuing to recover from definitive treatment for breast cancer. She will follow-up with her medical oncologist, Dr. Burr Medico in 12/2016 with history and physical exam per surveillance protocol.  She will continue her anti-estrogen therapy with Exemestane. Thus far, she is tolerating the Exemestane well, with minimal side effects. She was instructed to make Dr. Burr Medico or myself aware if she begins to experience any worsening side effects of the medication and I could see her back in clinic to help manage those side effects, as needed. Today, a comprehensive survivorship care plan and treatment summary was reviewed  with the patient today detailing her breast cancer diagnosis, treatment course, potential late/long-term effects of treatment, appropriate follow-up care with recommendations for the future, and patient education resources.  A copy of this summary, along with a letter will be sent to the patient's primary care provider via mail/fax/In Basket message after today's visit.    2. Strong family  history of breast cancer: I reviewed her family history with her, and recommended she see one of our genetics counselors as she has two first degree relatives with breast cancer history.    3. Bone health:  Given Ms. Winterrowd's age/history of breast cancer and her current treatment regimen including anti-estrogen therapy with Exemestane, she is at risk for bone demineralization.  She is overdue for DEXA and I requested this be scheduled today.  In the meantime, she was encouraged to increase her consumption of foods rich in calcium, as well as increase her weight-bearing activities.  She was given education on specific activities to promote bone health.  4. Cancer screening:  Due to Ms. Demarest's history and her age, she should receive screening for skin cancers, colon cancer, and gynecologic cancers.  The information and recommendations are listed on the patient's comprehensive care plan/treatment summary and were reviewed in detail with the patient.    5. Health maintenance and wellness promotion: Ms. Marcoux was encouraged to consume 5-7 servings of fruits and vegetables per day. We reviewed the "Nutrition Rainbow" handout, as well as the handout "Take Control of Your Health and Reduce Your Cancer Risk" from the [place].  She was also encouraged to engage in moderate to vigorous exercise for 30 minutes per day most days of the week. We discussed the [employer] fitness program, which is designed for cancer survivors to help them become more physically fit after cancer treatments.  She was instructed to  limit her alcohol consumption and continue to abstain from tobacco use.     6. Support services/counseling: It is not uncommon for this period of the patient's cancer care trajectory to be one of many emotions and stressors.  We discussed an opportunity for her to participate in the next session of [facility] ("Finding Your New Normal") support group series designed for patients after they have completed treatment.   Ms. Burack was encouraged to take advantage of our many other support services programs, support groups, and/or counseling in coping with her new life as a cancer survivor after completing anti-cancer treatment.  She was offered support today through active listening and expressive supportive counseling.  She was given information regarding our available services and encouraged to contact me with any questions or for help enrolling in any of our support group/programs.    Dispo:   -Return to cancer center in 12/2016 for follow up with Dr. Burr Medico -Mammogram due in 01/2017 -Bone density overdue -Follow up with surgery, Dr. Brantley Stage in January -Consider annual breast MRI surveillance -She is welcome to return back to the Survivorship Clinic at any time; no additional follow-up needed at this time.  -Consider referral back to survivorship as a long-term survivor for continued surveillance  A total of (30) minutes of face-to-face time was spent with this patient with greater than 50% of that time in counseling and care-coordination.   Gardenia Phlegm, NP Survivorship Program [facility] [phone]   Note: PRIMARY CARE PROVIDER Darreld Mclean, [place] [phone]

## 2016-10-23 ENCOUNTER — Ambulatory Visit (HOSPITAL_BASED_OUTPATIENT_CLINIC_OR_DEPARTMENT_OTHER): Payer: Medicare HMO | Admitting: Adult Health

## 2016-10-23 VITALS — BP 138/89 | HR 88 | Temp 98.2°F | Resp 20 | Ht 67.0 in | Wt 288.1 lb

## 2016-10-23 DIAGNOSIS — N951 Menopausal and female climacteric states: Secondary | ICD-10-CM | POA: Diagnosis not present

## 2016-10-23 DIAGNOSIS — C50511 Malignant neoplasm of lower-outer quadrant of right female breast: Secondary | ICD-10-CM

## 2016-10-23 DIAGNOSIS — Z79811 Long term (current) use of aromatase inhibitors: Secondary | ICD-10-CM

## 2016-10-23 DIAGNOSIS — Z803 Family history of malignant neoplasm of breast: Secondary | ICD-10-CM | POA: Diagnosis not present

## 2016-10-23 DIAGNOSIS — Z170 Estrogen receptor positive status [ER+]: Secondary | ICD-10-CM | POA: Diagnosis not present

## 2016-10-23 DIAGNOSIS — F319 Bipolar disorder, unspecified: Secondary | ICD-10-CM | POA: Diagnosis not present

## 2016-10-24 ENCOUNTER — Encounter: Payer: Self-pay | Admitting: Adult Health

## 2016-10-26 DIAGNOSIS — F330 Major depressive disorder, recurrent, mild: Secondary | ICD-10-CM | POA: Diagnosis not present

## 2016-11-01 ENCOUNTER — Ambulatory Visit (HOSPITAL_BASED_OUTPATIENT_CLINIC_OR_DEPARTMENT_OTHER): Payer: Medicare HMO | Admitting: Genetics

## 2016-11-01 ENCOUNTER — Encounter: Payer: Self-pay | Admitting: Genetics

## 2016-11-01 ENCOUNTER — Other Ambulatory Visit (HOSPITAL_BASED_OUTPATIENT_CLINIC_OR_DEPARTMENT_OTHER): Payer: Medicare HMO

## 2016-11-01 DIAGNOSIS — Z800 Family history of malignant neoplasm of digestive organs: Secondary | ICD-10-CM | POA: Insufficient documentation

## 2016-11-01 DIAGNOSIS — Z809 Family history of malignant neoplasm, unspecified: Secondary | ICD-10-CM | POA: Diagnosis not present

## 2016-11-01 DIAGNOSIS — C50511 Malignant neoplasm of lower-outer quadrant of right female breast: Secondary | ICD-10-CM

## 2016-11-01 DIAGNOSIS — Z803 Family history of malignant neoplasm of breast: Secondary | ICD-10-CM

## 2016-11-01 DIAGNOSIS — Z170 Estrogen receptor positive status [ER+]: Principal | ICD-10-CM

## 2016-11-01 LAB — CBC WITH DIFFERENTIAL/PLATELET
BASO%: 0.6 % (ref 0.0–2.0)
BASOS ABS: 0 10*3/uL (ref 0.0–0.1)
EOS%: 1.7 % (ref 0.0–7.0)
Eosinophils Absolute: 0.1 10*3/uL (ref 0.0–0.5)
HEMATOCRIT: 45 % (ref 34.8–46.6)
HGB: 14.7 g/dL (ref 11.6–15.9)
LYMPH#: 2 10*3/uL (ref 0.9–3.3)
LYMPH%: 41.4 % (ref 14.0–49.7)
MCH: 29.1 pg (ref 25.1–34.0)
MCHC: 32.7 g/dL (ref 31.5–36.0)
MCV: 88.7 fL (ref 79.5–101.0)
MONO#: 0.5 10*3/uL (ref 0.1–0.9)
MONO%: 11 % (ref 0.0–14.0)
NEUT#: 2.2 10*3/uL (ref 1.5–6.5)
NEUT%: 45.3 % (ref 38.4–76.8)
PLATELETS: 177 10*3/uL (ref 145–400)
RBC: 5.07 10*6/uL (ref 3.70–5.45)
RDW: 14.5 % (ref 11.2–14.5)
WBC: 4.8 10*3/uL (ref 3.9–10.3)

## 2016-11-01 LAB — COMPREHENSIVE METABOLIC PANEL
ALT: 18 U/L (ref 0–55)
ANION GAP: 13 meq/L — AB (ref 3–11)
AST: 11 U/L (ref 5–34)
Albumin: 3.8 g/dL (ref 3.5–5.0)
Alkaline Phosphatase: 68 U/L (ref 40–150)
BUN: 10.1 mg/dL (ref 7.0–26.0)
CALCIUM: 9.6 mg/dL (ref 8.4–10.4)
CHLORIDE: 100 meq/L (ref 98–109)
CO2: 27 mEq/L (ref 22–29)
CREATININE: 0.9 mg/dL (ref 0.6–1.1)
EGFR: 70 mL/min/{1.73_m2} — AB (ref >90)
Glucose: 293 mg/dl — ABNORMAL HIGH (ref 70–140)
POTASSIUM: 4.3 meq/L (ref 3.5–5.1)
Sodium: 140 mEq/L (ref 136–145)
Total Bilirubin: 0.27 mg/dL (ref 0.20–1.20)
Total Protein: 7.4 g/dL (ref 6.4–8.3)

## 2016-11-01 NOTE — Progress Notes (Signed)
REFERRING PROVIDER: Gardenia Phlegm, NP [place], [place] [address]  PRIMARY PROVIDER:  Darreld Mclean, MD  PRIMARY REASON FOR VISIT:  1. Malignant neoplasm of lower-outer quadrant of right breast of female, estrogen receptor positive ([facility])   2. Family history of breast cancer   3. Family history of pancreatic cancer     HISTORY OF PRESENT ILLNESS:   Ms. Rhonda Berg, a 76 y.o. female, was seen for a [facility] cancer genetics consultation at the request of Dr. Delice Bison due to a personal and family history of cancer.  Rhonda Berg presents to clinic today to discuss the possibility of a hereditary predisposition to cancer, genetic testing, and to further clarify her future cancer risks, as well as potential cancer risks for family members.   In 2017, at the age of 44, Rhonda Berg was diagnosed with invasive ductal carcinoma of the right breast. This was treated with neoadjuvant anti-estrogen therapy, lumpectomy, and adjuvant radiation.      CANCER HISTORY:  Oncology History   Cancer Staging Breast cancer of lower-outer quadrant of right female breast [facility]) Staging form: Breast, AJCC 7th Edition - Clinical stage from 01/13/2016: Stage IA (T1c(m), N0, M0) - Signed by Truitt Merle, MD on 01/30/2016 - Pathologic stage from 04/25/2016: Stage IIA (T2, N0, cM0) - Signed by Truitt Merle, MD on 05/18/2016       Breast cancer of lower-outer quadrant of right female breast ([place])   01/11/2016 Mammogram    Diagnostic mammogram and ultrasound showed multiple small masses in the lower outer quadrant of the right breast, 2 representative mass at 8:00 position measuring 1.6 and the 0.9 cm. The axillary adenopathy.       01/13/2016 Initial Diagnosis    Breast cancer of lower-outer quadrant of right female breast ([place])      01/13/2016 Initial Biopsy    Right breast 8:00 position two biopsies showed invasive ductal carcinoma, and intermediate grade DCIS.      01/13/2016 Receptors her2     ER 100% positive, PR 100% positive, HER-2 negative, Ki-67 12%       03/08/2016 -  Neo-Adjuvant Anti-estrogen oral therapy    Exemestane 25 mg daily      04/25/2016 Pathology Results    Right Lumpectomy and Right SLN Biopsy 04/25/2016 1. Breast, lumpectomy, Right INVASIVE DUCTAL CARCINOMA, GRADE 2, SPANNING 2.7 CM  DUCTAL CARCINOMA IN SITU, GRADE 1 ALL MARGINS OF RESECTION ARE NEGATIVE FOR CARCINOMA 2. Lymph node, sentinel, biopsy, Right axillary ONE BENIGN LYMPH NODE (0/1) 3. Breast, excision, Right additional anterior margin BENIGN BREAST TISSUE      04/25/2016 Oncotype testing    Oncotype DX score of 2, low risk. 10 year risk of distant recurrence of 4% on Tamoxifen alone.      04/25/2016 Surgery    Right breast lumpectomy with radioactive seed and sentinel lymph node biopsy by Dr. Brantley Stage      06/12/2016 - 07/23/2016 Radiation Therapy    Site/dose:   Right Breast treated to 50 Gy in 25 fractions, then Boosted an additional 10 Gy in 5 fractions        HORMONAL RISK FACTORS:  Menarche was at age 19.  Ovaries intact: unsure.  Hysterectomy: patient reports a partial hysterectomy was performed whe she was 64.  She is not sure if her ovaries/fallopian tubes, etc were removed or not. Menopausal status: postmenopausal.  Colonoscopy: yes; last colonocopy in 2015.  Some hyperplastic polyps found.  Patient says she was having colonoscopies every  3 years, then every5, and now is every 8 years.  . Mammogram within the last year: yes.  Past Medical History:  Diagnosis Date  . Arthritis   . Bipolar 1 disorder ([place])   . Cancer ([place])    breast  . Depression   . Diabetes mellitus without complication ([place])   . Diabetic neuropathy ([place])   . Dyspnea   . Family history of breast cancer   . Family history of pancreatic cancer   . History of radiation therapy 06/12/16- 07/23/16   Right Breast 50 Gy in 25 fractions. Right Breast boost 10Gy in 5 fractions  . Hypercholesteremia   .  Hypertension   . Peripheral edema   . Proteinuria   . Sleep apnea    has CPAP but doesnt wear it    Past Surgical History:  Procedure Laterality Date  . ABDOMINAL HYSTERECTOMY    . BREAST LUMPECTOMY WITH RADIOACTIVE SEED AND SENTINEL LYMPH NODE BIOPSY Right 04/25/2016   Procedure: RIGHT BREAST LUMPECTOMY WITH RADIOACTIVE SEED AND SENTINEL LYMPH NODE BIOPSY;  Surgeon: Erroll Luna, MD;  Location: [facility];  Service: General;  Laterality: Right;  . COLONOSCOPY    . KNEE SURGERY     bilateral partial knee replacements    Social History   Social History  . Marital status: Married    Spouse name: N/A  . Number of children: N/A  . Years of education: N/A   Social History Main Topics  . Smoking status: Former Smoker    Packs/day: 0.25    Years: 40.00    Quit date: 04/09/2002  . Smokeless tobacco: Never Used  . Alcohol use No  . Drug use: No     Comment: when she was in [age]   . Sexual activity: Not Asked   Other Topics Concern  . None   Social History Narrative   Retired [employer].     Highest education:  Master's degree   She lives with her daughter and husband.      FAMILY HISTORY:  We obtained a detailed, 4-generation family history.  Significant diagnoses are listed below: Family History  Problem Relation Age of Onset  . Breast cancer Mother 56       early 31's, lived to 56  . Breast cancer Sister        first dx in 14's.  breast cancer 'came back' recently in her early 42's  . Breast cancer Maternal Aunt 66       She is now 42  . Cancer Cousin        colon cancer   . Cancer Maternal Uncle        type of cancer unknown, died from cancer in 37's  . Pneumonia Maternal Grandmother 86       died at 44  . Cancer Maternal Uncle        type of cancer unknown, died from cancer in 45's  . Cancer Maternal Uncle        type of cancer unknown, died from cancer in 27's   Rhonda Berg has a 67 year-old daughter who has no history of cancer.  She has 2 grandsons and  1 granddaughter (ages 67, 63, and 23) who are healthy.  Rhonda Berg has 3 sisters and 2 brothers listed below: -1 sister died at 81 due to AIDS.  She had 1 daughter who is in her 5's and has no history of cancer.  -1 sister was diagnosed with breast cancer in  her early 10's.  It has recently come back now in her early 55's.  She has 4 sons and a daughter who are in their 37's with no history of cancer.  -1 sister is 42 years old with no history of cancer.  She has 1 daughter with no history of cancer.  She has a son who has had cancer.  The details about the type, age of onset, and age of this nephew are unknown.  This sister also has 2-3 other children, with no known history of cancer.    -1 brother died at 20 due to AIDS.  He had 1 daughter who is in her 84's/50's with no known history of cancer.    -1 brother died at 19 due to AIDS.  He had no children or any history of cancer.   Ms. Cumberland's mother had breast cancer in her early 24's.  She died at the age of 21.  Ms. Toole has 53 maternal aunts and uncles. The aunts/uncles who have a history of cancer are listed below: -1 maternal aunt is 19 and was diagnosed with breast cancer at 26.  She has 2 daughters and a son.  One of the daughters (patient's cousin) was recently diagnosed with pancreatic cancer at 79.  She has no history of smoking or alcohol abuse. This cousin has 3 daughters with no history of cancer.   -1 maternal uncle died from cancer at 34.  The type of cancer and further details are unknown.  -1 maternal uncle died from cancer in his 78's.  The type of cancer and other details are unknown.  -1 maternal uncle died from cancer in his 62's. The type of cancer and other details are unknown.  Ms. Kristensen has several maternal cousins.  She has limited information about these relatives, but is not aware of any other cancer history for these relatives.  Ms. Faulkner's maternal grandfather died in his 52's/80's due to unknown causes.   Ms.  Damian's maternal grandmother died from pneumonia at 20.  There is no information about any other extended relatives.    Ms. Trevizo's father died in his 89's and had no history of cancer.  He had a sister who is no in her 22's with no known history of cancer. Ms. Leifheit does not know any other information about her paternal side of the family.   Ms. Naser reports that she did not have much contact with her relatives for many years, and only recently learned about her family history of cancer.  Her knowledge of her relatives and their medical history is limited.   Ms. Zbikowski is unaware of previous family history of genetic testing for hereditary cancer risks. Patient's maternal ancestors are of African American descent, and paternal ancestors are of African American/Cherokee descent. There is no reported Ashkenazi Jewish ancestry. There is no known consanguinity.  GENETIC COUNSELING ASSESSMENT: TANEIKA CHOI is a 76 y.o. female with a personal and family history which is somewhat suggestive of a Hereditary Cancer Predisposition Syndrome.  We, therefore, discussed and recommended the following at today's visit.   DISCUSSION: We reviewed the characteristics, features and inheritance patterns of hereditary cancer syndromes. We also discussed genetic testing, including the appropriate family members to test, the process of testing, insurance coverage and turn-around-time for results. We discussed the implications of a negative, positive and/or variant of uncertain significant result. We recommended Ms. Grigg pursue genetic testing for the [facility] Multi-Cancer gene panel.The Multi-Cancer Panel offered by [facility] includes  sequencing and/or deletion duplication testing of the following 83 genes: ALK, APC, ATM, AXIN2,BAP1,  BARD1, BLM, BMPR1A, BRCA1, BRCA2, BRIP1, CASR, CDC73, CDH1, CDK4, CDKN1B, CDKN1C, CDKN2A (p14ARF), CDKN2A (p16INK4a), CEBPA, CHEK2, CTNNA1, DICER1, DIS3L2, EGFR (c.2369C>T, p.Thr790Met  variant only), EPCAM (Deletion/duplication testing only), FH, FLCN, GATA2, GPC3, GREM1 (Promoter region deletion/duplication testing only), HOXB13 (c.251G>A, p.Gly84Glu), HRAS, KIT, MAX, MEN1, MET, MITF (c.952G>A, p.Glu318Lys variant only), MLH1, MSH2, MSH3, MSH6, MUTYH, NBN, NF1, NF2, NTHL1, PALB2, PDGFRA, PHOX2B, PMS2, POLD1, POLE, POT1, PRKAR1A, PTCH1, PTEN, RAD50, RAD51C, RAD51D, RB1, RECQL4, RET, RUNX1, SDHAF2, SDHA (sequence changes only), SDHB, SDHC, SDHD, SMAD4, SMARCA4, SMARCB1, SMARCE1, STK11, SUFU, TERC, TERT, TMEM127, TP53, TSC1, TSC2, VHL, WRN and WT1.    We discussed that only 5-10% of cancers are associated with a Hereditary cancer predisposition syndrome.  One of the most common hereditary cancer syndromes that increases breast cancer risk is called Hereditary Breast and Ovarian Cancer (HBOC) syndrome.  This syndrome is caused by mutations in the BRCA1 and BRCA2 genes.  This syndrome increases an individual's lifetime risk to develop breast, ovarian, pancreatic, and other types of cancer.  There are also many other cancer predisposition syndromes caused by mutations in several other genes.  We discussed that if she is found to have a mutation in one of these genes, it may impact future medical management recommendations such as increased cancer screenings and consideration of risk reducing surgeries.  A positive result could also have implications for the patient's family members.  A Negative result would mean we were unable to identify a hereditary component to her cancer, but does not rule out the possibility of a hereditary basis for her cancer.  There could be mutations that are undetectable by current technology, or in genes not yet tested or identified to increase cancer risk.    We discussed the potential to find a Variant of Uncertain Significance or VUS.  These are variants that have not yet been identified as pathogenic or benign, and it is unknown if this variant is associated with  increased cancer risk or if this is a normal finding.  Most VUS's are reclassified to benign or likely benign.   It should not be used to make medical management decisions. With time, we suspect the lab will determine the significance of any VUS's identified if any.   Based on Ms. Kleinert's personal and family history of cancer, she meets medical criteria for genetic testing. Despite that she meets criteria, she may still have an out of pocket cost. We discussed that if her out of pocket cost for testing is over $100, the laboratory will call and confirm whether she wants to proceed with testing.  If the out of pocket cost of testing is less than $100 she will be billed by the genetic testing laboratory.   PLAN: After considering the risks, benefits, and limitations, Ms. Tabar  provided informed consent to pursue genetic testing and the blood sample was sent to [facility] for analysis of the Multi-Cancer Panel. Results should be available within approximately 2-3 weeks' time, at which point they will be disclosed by telephone to Ms. Twardowski, as will any additional recommendations warranted by these results. Ms. Lathon will receive a summary of her genetic counseling visit and a copy of her results once available. This information will also be available in Epic. We encouraged Ms. Remington to remain in contact with cancer genetics annually so that we can continuously update the family history and inform her of any changes in  cancer genetics and testing that may be of benefit for her family. Ms. Muckle's questions were answered to her satisfaction today. Our contact information was provided should additional questions or concerns arise.  Based on Ms. Silvestri's family history, her maternal cousin with pancreatic cancer as well as her sister and maternal aunt with breast cancer meet medical criteria and are recommended to have genetic counseling and genetic testing. Ms. Eppard will let us know if we can be of  any assistance in coordinating genetic counseling and/or testing for this family member.   Lastly, we encouraged Ms. Ausmus to remain in contact with cancer genetics annually so that we can continuously update the family history and inform her of any changes in cancer genetics and testing that may be of benefit for this family.   Ms.  Pendergraph's questions were answered to her satisfaction today. Our contact information was provided should additional questions or concerns arise. Thank you for the referral and allowing [place] to share in the care of your patient.   Tana Felts, MS Genetic Counselor [email or web address] phone: [phone]  The patient was seen for a total of 50 minutes in face-to-face genetic counseling.

## 2016-11-02 ENCOUNTER — Other Ambulatory Visit: Payer: Self-pay | Admitting: Family Medicine

## 2016-11-02 DIAGNOSIS — E785 Hyperlipidemia, unspecified: Secondary | ICD-10-CM

## 2016-11-02 DIAGNOSIS — E118 Type 2 diabetes mellitus with unspecified complications: Secondary | ICD-10-CM

## 2016-11-05 ENCOUNTER — Ambulatory Visit (INDEPENDENT_AMBULATORY_CARE_PROVIDER_SITE_OTHER): Payer: Medicare HMO | Admitting: Neurology

## 2016-11-05 ENCOUNTER — Encounter: Payer: Self-pay | Admitting: Neurology

## 2016-11-05 VITALS — BP 110/70 | HR 86 | Ht 67.0 in | Wt 290.6 lb

## 2016-11-05 DIAGNOSIS — G8929 Other chronic pain: Secondary | ICD-10-CM

## 2016-11-05 DIAGNOSIS — E1142 Type 2 diabetes mellitus with diabetic polyneuropathy: Secondary | ICD-10-CM

## 2016-11-05 DIAGNOSIS — R251 Tremor, unspecified: Secondary | ICD-10-CM | POA: Diagnosis not present

## 2016-11-05 DIAGNOSIS — E538 Deficiency of other specified B group vitamins: Secondary | ICD-10-CM | POA: Diagnosis not present

## 2016-11-05 DIAGNOSIS — M545 Low back pain: Secondary | ICD-10-CM | POA: Diagnosis not present

## 2016-11-05 MED ORDER — CYCLOBENZAPRINE HCL 5 MG PO TABS: 5.0000 mg | ORAL_TABLET | Freq: Every evening | ORAL | 5 refills | Status: AC | PRN

## 2016-11-05 NOTE — Progress Notes (Signed)
Follow-up Visit   Date: 11/05/16    Rhonda Berg MRN: [id number] DOB: 1940-10-03   Interim History: Rhonda Berg is a 76 y.o. right-handed African American female with bipolar disorder, right breast cancer (Oct 2017), hyperlipidemia, hypertension, insulin dependent diabetes mellitus, chronic low back pain, and former smoker  returning to the clinic for follow-up of neuropathy.  The patient was accompanied to the clinic by self.  History of present illness: Starting around 2014, she began experiencing burning pain of the legs, below the knees.  Pain is constant and worse when she is not on gabapentin.  Since starting gabapentin 600mg  four times daily, she has excellent pain relief.  Unfortunately, she ran out of her medication a few days ago and her pain has been much worse.  There is no numbness or similar pain the hands.  She denies any weakness of the legs. She had NCS/EMG of the legs in the spring of 2017, but does not know the results of that test.   She walks with a cane for the past 2 years because of low back pain.  Pain is worse with activity and walking and improved with rest.  She denies any radicular pain into her legs. She has had a number of MRIs of the lumbar spine which I do not have to reviews, but patient states she has "arthritis".  She has never had back surgery or has steroid injections.  She has tried physical therapy several times, but denies any improvement.    She was previously seeing neurologist, Dr. Modena Morrow, in [place], [place] and moved to [place] in 2017. Unfortunately, I do not have any of these records and will be requesting them to be faxed.   She also takes amantadine 100mg  twice daily for tardive dyskinesia.    UPDATE 11/05/2016:  She is here for 6 month follow-up visit.  She continues to have low back pain which did not respond to zanaflex and is not interested in physical therapy.  She complains of intermittent tremors, especially when  trying to sign papers. She was started on propranolol 10mg  BID in April and has not noticed a marked improvement.   She has been on low dose depakote 250mg  for bipolar disease for sometime and sees Dr. Casimiro Needle who also manages her dyskinesias. Neuropathy is well-controlled on gabapentin 600mg  QID.    Medications:  Current Outpatient Prescriptions on File Prior to Visit  Medication Sig Dispense Refill  . albuterol (PROVENTIL HFA;VENTOLIN HFA) 108 (90 Base) MCG/ACT inhaler Inhale 2 puffs into the lungs every 6 (six) hours as needed for wheezing or shortness of breath. 1 Inhaler 6  . amantadine (SYMMETREL) 100 MG capsule Take 100 mg by mouth 2 (two) times daily.    [name] aspirin 81 MG tablet Take 81 mg by mouth daily.    . BD PEN NEEDLE NANO U/F 32G X 4 MM MISC Check sugar 2-3 times daily    . Blood Glucose Monitoring Suppl MISC Pt uses one touch verio flex. Please dispense test strips #100 and lancets #100. She tests her sugar 2-3x daily 100 each 3  . clonazePAM (KLONOPIN) 1 MG tablet     . divalproex (DEPAKOTE ER) 250 MG 24 hr tablet Take 250 mg by mouth daily.    [name] exemestane (AROMASIN) 25 MG tablet take 1 tablet by mouth once daily AFTER BREAKFAST 30 tablet 2  . furosemide (LASIX) 40 MG tablet take 1 tablet by mouth once daily 30 tablet 6  .  gabapentin (NEURONTIN) 600 MG tablet Take 1 tablet (600 mg total) by mouth 4 (four) times daily. 120 tablet 11  . LANTUS SOLOSTAR 100 UNIT/ML Solostar Pen Take 48 units subcutaneously once daily.  Adjust as needed (Patient taking differently: Inject 40 Units into the skin daily before breakfast. Adjust as needed) 15 mL 5  . metFORMIN (GLUCOPHAGE) 1000 MG tablet Take 1 tablet (1,000 mg total) by mouth 2 (two) times daily with a meal. 180 tablet 3  . mirtazapine (REMERON) 15 MG tablet Take 15 mg by mouth at bedtime.    . propranolol (INDERAL) 10 MG tablet take 1 tablet by mouth twice a day 60 tablet 3  . simvastatin (ZOCOR) 40 MG tablet take 1 tablet by mouth every  evening 30 tablet 6  . sitaGLIPtin (JANUVIA) 100 MG tablet Take 1 tablet (100 mg total) by mouth daily. 30 tablet 5   No current facility-administered medications on file prior to visit.     Allergies: No Known Allergies  Review of Systems:  CONSTITUTIONAL: No fevers, chills, night sweats, or weight loss.  EYES: No visual changes or eye pain ENT: No hearing changes.  No history of nose bleeds.   RESPIRATORY: No cough, wheezing and shortness of breath.   CARDIOVASCULAR: Negative for chest pain, and palpitations.   GI: Negative for abdominal discomfort, blood in stools or black stools.  No recent change in bowel habits.   GU:  No history of incontinence.   MUSCLOSKELETAL: +history of joint pain or swelling.  No myalgias.   SKIN: Negative for lesions, rash, and itching.   ENDOCRINE: Negative for cold or heat intolerance, polydipsia or goiter.   PSYCH:  + depression or anxiety symptoms.   NEURO: As Above.   Vital Signs:  BP 110/70   Pulse 86   Ht 5\' 7"  (1.702 m)   Wt 290 lb 9 oz (131.8 kg)   BMI 45.51 kg/m   Neurological Exam: MENTAL STATUS including orientation to time, place, person, recent and remote memory, attention span and concentration, language, and fund of knowledge is normal.  Speech is not dysarthric.  CRANIAL NERVES:  Pupils equal round and reactive to light.  Normal conjugate, extra-ocular eye movements in all directions of gaze.  Face is symmetric. Palate elevates symmetrically.  Tongue is midline.  MOTOR:  Motor strength is 5/5 in all extremities.  Mild intention tremor of the hands bilaterally.  No pronator drift.  Tone is normal.   MSRs:  Reflexes are 2+/4 throughout, except absent patella and achilles reflexes.   SENSORY: Vibration is mildly reduced at the great toe bilaterally.    COORDINATION/GAIT: Mild bradykinesias with finger tapping with reduced amplitude bilaterally.  She is unable to rise from a chair without using arms.  Gait is wide-based and stable,  assisted with cane.  Data: Labs 05/09/2016:  TSH 0.67, vitamin B12, 258 SPEP with IFE no M protein Lab Results  Component Value Date   HGBA1C 9.9 (H) 08/02/2016   MRI lumbar spine 02/04/2015 at [facility], [place], [place]:  Mild multilevel degenerative changes and foraminal stenosis, broad-based discu bulge at L4-5 with mild canal stenosis and foraminal stenosis.  Stable as compared to 2015.   IMPRESSION/PLAN: 1.  Diabetic polyneuropathy affecting the legs, poorly controlled diabetes with last HbA1c 9.9- clinically stable.   - Continue gabapentin 600mg  QID  - Secondary causes for neuropathy were unrevealing.  She has mildly low vitamin B12 and was recommended to start OTC B12 1053mcg daily  - Encouraged her  to maintain tight control of her diabetes  2.  Chronic low back pain due to multilevel degenerative changes and disc bulge at L4-5 - worsening  - No benefit with zanaflex  - Start flexeril 5mg  at bedtime  - PT declined  3.  Gait abnormality due to #1, #2, and morbid obesity  4.  Tremor, ?essential vs. side effects from depakote although it is very low dose  - Continue propranolol 10mg  twice daily and add extra dose as needed  5.  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    5.  Right breast cancer s/p lumpectomy and radiation  Return to clinic in 6 months  The duration of this appointment visit was 30 minutes of face-to-face time with the patient.  Greater than 50% of this time was spent in counseling, explanation of diagnosis, planning of further management, and coordination of care.   Thank you for allowing me to participate in patient's care.  If I can answer any additional questions, I would be pleased to do so.    Sincerely,    Aurielle Slingerland K. Posey Pronto, DO

## 2016-11-05 NOTE — Patient Instructions (Signed)
Continue propranolol 10mg  twice daily and okay to take extra tablet as needed for tremors Continue gabapentin 600mg  four times daily Start flexeril 5mg  at bedtime Continue vitamin B12 1070mcg daily Please call my office if you would like to start physical therapy  Return to clinic 6 month

## 2016-11-07 ENCOUNTER — Ambulatory Visit: Payer: Medicare HMO | Admitting: Neurology

## 2016-11-08 ENCOUNTER — Telehealth: Payer: Self-pay | Admitting: Neurology

## 2016-11-08 NOTE — Telephone Encounter (Signed)
Please advise 

## 2016-11-08 NOTE — Telephone Encounter (Signed)
PT called and said her insurance company will not pay for her flexeril and wants a prescription for something her insurance will pay for

## 2016-11-09 ENCOUNTER — Ambulatory Visit: Payer: Medicare HMO | Admitting: Neurology

## 2016-11-12 ENCOUNTER — Other Ambulatory Visit: Payer: Self-pay | Admitting: *Deleted

## 2016-11-12 MED ORDER — BACLOFEN 10 MG PO TABS: 10.0000 mg | ORAL_TABLET | Freq: Every day | ORAL | 3 refills | Status: AC

## 2016-11-12 NOTE — Telephone Encounter (Signed)
OK to try baclofen 10mg  at bedtime.

## 2016-11-12 NOTE — Telephone Encounter (Signed)
Patient notified and Rx sent

## 2016-11-14 ENCOUNTER — Encounter: Payer: Self-pay | Admitting: Genetics

## 2016-11-14 DIAGNOSIS — Z1379 Encounter for other screening for genetic and chromosomal anomalies: Secondary | ICD-10-CM | POA: Insufficient documentation

## 2016-11-16 ENCOUNTER — Telehealth: Payer: Self-pay | Admitting: Genetics

## 2016-11-16 DIAGNOSIS — F330 Major depressive disorder, recurrent, mild: Secondary | ICD-10-CM | POA: Diagnosis not present

## 2016-11-17 DIAGNOSIS — S99922A Unspecified injury of left foot, initial encounter: Secondary | ICD-10-CM | POA: Diagnosis not present

## 2016-11-20 ENCOUNTER — Other Ambulatory Visit: Payer: Self-pay | Admitting: Neurology

## 2016-11-23 ENCOUNTER — Ambulatory Visit: Payer: Self-pay | Admitting: Genetics

## 2016-11-23 ENCOUNTER — Encounter: Payer: Self-pay | Admitting: Genetics

## 2016-11-23 DIAGNOSIS — C50511 Malignant neoplasm of lower-outer quadrant of right female breast: Secondary | ICD-10-CM

## 2016-11-23 DIAGNOSIS — Z800 Family history of malignant neoplasm of digestive organs: Secondary | ICD-10-CM

## 2016-11-23 DIAGNOSIS — Z803 Family history of malignant neoplasm of breast: Secondary | ICD-10-CM

## 2016-11-23 DIAGNOSIS — Z170 Estrogen receptor positive status [ER+]: Principal | ICD-10-CM

## 2016-11-23 NOTE — Telephone Encounter (Signed)
Revealed negative genetic testing.  Revealed VUS's in TERT and in RECQL4. Discussed that we do not know why she has breast cancer or why there is cancer in the family. It could be due to a different gene that we are not testing, or maybe our current technology may not be able to pick something up.  It will be important for her to keep in contact with genetics to keep up with whether additional testing may be needed.

## 2016-11-23 NOTE — Progress Notes (Signed)
HPI: Ms. Topete was previously seen in the [place] clinic on 11/01/2016 due to a personal and family history of breast and pancreatic cancer and concerns regarding a hereditary predisposition to cancer. Please refer to our prior cancer genetics clinic note for more information regarding Ms. Awan's medical, social and family histories, and our assessment and recommendations, at the time. Ms. Joslin's recent genetic test results were disclosed to her, as well as recommendations warranted by these results. These results and recommendations are discussed in more detail below.  CANCER HISTORY:  Oncology History   Cancer Staging Breast cancer of lower-outer quadrant of right female breast [facility]) Staging form: Breast, AJCC 7th Edition - Clinical stage from 01/13/2016: Stage IA (T1c(m), N0, M0) - Signed by Truitt Merle, MD on 01/30/2016 - Pathologic stage from 04/25/2016: Stage IIA (T2, N0, cM0) - Signed by Truitt Merle, MD on 05/18/2016       Breast cancer of lower-outer quadrant of right female breast ([place])   01/11/2016 Mammogram    Diagnostic mammogram and ultrasound showed multiple small masses in the lower outer quadrant of the right breast, 2 representative mass at 8:00 position measuring 1.6 and the 0.9 cm. The axillary adenopathy.       01/13/2016 Initial Diagnosis    Breast cancer of lower-outer quadrant of right female breast ([place])      01/13/2016 Initial Biopsy    Right breast 8:00 position two biopsies showed invasive ductal carcinoma, and intermediate grade DCIS.      01/13/2016 Receptors her2    ER 100% positive, PR 100% positive, HER-2 negative, Ki-67 12%       03/08/2016 -  Neo-Adjuvant Anti-estrogen oral therapy    Exemestane 25 mg daily      04/25/2016 Pathology Results    Right Lumpectomy and Right SLN Biopsy 04/25/2016 1. Breast, lumpectomy, Right INVASIVE DUCTAL CARCINOMA, GRADE 2, SPANNING 2.7 CM  DUCTAL CARCINOMA IN SITU, GRADE 1 ALL MARGINS OF RESECTION ARE  NEGATIVE FOR CARCINOMA 2. Lymph node, sentinel, biopsy, Right axillary ONE BENIGN LYMPH NODE (0/1) 3. Breast, excision, Right additional anterior margin BENIGN BREAST TISSUE      04/25/2016 Oncotype testing    Oncotype DX score of 2, low risk. 10 year risk of distant recurrence of 4% on Tamoxifen alone.      04/25/2016 Surgery    Right breast lumpectomy with radioactive seed and sentinel lymph node biopsy by Dr. Brantley Stage      06/12/2016 - 07/23/2016 Radiation Therapy    Site/dose:   Right Breast treated to 50 Gy in 25 fractions, then Boosted an additional 10 Gy in 5 fractions      11/14/2016 Genetic Testing    Patient was tested due to a personal history of breast cancer and a family history of breast and pancreatic cancer.  The Multi-Cancer Panel was ordered.  The Multi-Cancer Panel offered by [facility] includes sequencing and/or deletion duplication testing of the following 83 genes: ALK, APC, ATM, AXIN2,BAP1,  BARD1, BLM, BMPR1A, BRCA1, BRCA2, BRIP1, CASR, CDC73, CDH1, CDK4, CDKN1B, CDKN1C, CDKN2A (p14ARF), CDKN2A (p16INK4a), CEBPA, CHEK2, CTNNA1, DICER1, DIS3L2, EGFR (c.2369C>T, p.Thr790Met variant only), EPCAM (Deletion/duplication testing only), FH, FLCN, GATA2, GPC3, GREM1 (Promoter region deletion/duplication testing only), HOXB13 (c.251G>A, p.Gly84Glu), HRAS, KIT, MAX, MEN1, MET, MITF (c.952G>A, p.Glu318Lys variant only), MLH1, MSH2, MSH3, MSH6, MUTYH, NBN, NF1, NF2, NTHL1, PALB2, PDGFRA, PHOX2B, PMS2, POLD1, POLE, POT1, PRKAR1A, PTCH1, PTEN, RAD50, RAD51C, RAD51D, RB1, RECQL4, RET, RUNX1, SDHAF2, SDHA (sequence changes only), SDHB, SDHC, SDHD, SMAD4, SMARCA4,  SMARCB1, SMARCE1, STK11, SUFU, TERC, TERT, TMEM127, TP53, TSC1, TSC2, VHL, WRN and WT1.   Results: No pathogenic mutations identified.  VUS's in RECQL4 c.2545G>A (p.Val849Met) and TERT c.779G>A (p.Gly260Asp) were identified.  The date of this report is 11/14/2016.        FAMILY HISTORY:  We obtained a detailed, 4-generation family  history.  Significant diagnoses are listed below: Family History  Problem Relation Age of Onset  . Breast cancer Mother 47       early 60's, lived to 58  . Breast cancer Sister        first dx in 19's.  breast cancer 'came back' recently in her early 65's  . Breast cancer Maternal Aunt 66       She is now 67  . Cancer Cousin        colon cancer   . Cancer Maternal Uncle        type of cancer unknown, died from cancer in 56's  . Pneumonia Maternal Grandmother 55       died at 54  . Cancer Maternal Uncle        type of cancer unknown, died from cancer in 82's  . Cancer Maternal Uncle        type of cancer unknown, died from cancer in 69's   Ms. Lucero has a 32 year-old daughter who has no history of cancer.  She has 2 grandsons and 1 granddaughter (ages 76, 21, and 65) who are healthy.  Ms. Karis has 3 sisters and 2 brothers listed below: -1 sister died at 94 due to AIDS.  She had 1 daughter who is in her 5's and has no history of cancer.  -1 sister was diagnosed with breast cancer in her early 55's.  It has recently come back now in her early 24's.  She has 4 sons and a daughter who are in their 5's with no history of cancer.  -1 sister is 80 years old with no history of cancer.  She has 1 daughter with no history of cancer.  She has a son who has had cancer.  The details about the type, age of onset, and age of this nephew are unknown.  This sister also has 2-3 other children, with no known history of cancer.    -1 brother died at 79 due to AIDS.  He had 1 daughter who is in her 59's/50's with no known history of cancer.    -1 brother died at 1 due to AIDS.  He had no children or any history of cancer.   Ms. Orellana's mother had breast cancer in her early 23's.  She died at the age of 71.  Ms. Draughon has 34 maternal aunts and uncles. The aunts/uncles who have a history of cancer are listed below: -1 maternal aunt is 37 and was diagnosed with breast cancer at 69.  She has 2 daughters  and a son.  One of the daughters (patient's cousin) was recently diagnosed with pancreatic cancer at 28.  She has no history of smoking or alcohol abuse. This cousin has 3 daughters with no history of cancer.   -1 maternal uncle died from cancer at 45.  The type of cancer and further details are unknown.  -1 maternal uncle died from cancer in his 43's.  The type of cancer and other details are unknown.  -1 maternal uncle died from cancer in his 41's. The type of cancer and other details are unknown.  Ms. Tavella  has several maternal cousins.  She has limited information about these relatives, but is not aware of any other cancer history for these relatives.  Ms. Osei's maternal grandfather died in his 66's/80's due to unknown causes.   Ms. Date's maternal grandmother died from pneumonia at 67.  There is no information about any other extended relatives.    Ms. Dobek's father died in his 63's and had no history of cancer.  He had a sister who is no in her 50's with no known history of cancer. Ms. Ohnemus does not know any other information about her paternal side of the family.   Ms. Lonon reports that she did not have much contact with her relatives for many years, and only recently learned about her family history of cancer.  Her knowledge of her relatives and their medical history is limited.   Ms. Ewell is unaware of previous family history of genetic testing for hereditary cancer risks. Patient's maternal ancestors are of African American descent, and paternal ancestors are of African American/Cherokee descent. There is no reported Ashkenazi Jewish ancestry. There is no known consanguinity.  GENETIC TEST RESULTS: Genetic testing performed through [facility]'s  Multi-Cancer Panel reported out on 11/14/2016 showed no pathogenic mutations. The Multi-Cancer Panel offered by [facility] includes sequencing and/or deletion duplication testing of the following 83 genes: ALK, APC, ATM, AXIN2,BAP1,  BARD1,  BLM, BMPR1A, BRCA1, BRCA2, BRIP1, CASR, CDC73, CDH1, CDK4, CDKN1B, CDKN1C, CDKN2A (p14ARF), CDKN2A (p16INK4a), CEBPA, CHEK2, CTNNA1, DICER1, DIS3L2, EGFR (c.2369C>T, p.Thr790Met variant only), EPCAM (Deletion/duplication testing only), FH, FLCN, GATA2, GPC3, GREM1 (Promoter region deletion/duplication testing only), HOXB13 (c.251G>A, p.Gly84Glu), HRAS, KIT, MAX, MEN1, MET, MITF (c.952G>A, p.Glu318Lys variant only), MLH1, MSH2, MSH3, MSH6, MUTYH, NBN, NF1, NF2, NTHL1, PALB2, PDGFRA, PHOX2B, PMS2, POLD1, POLE, POT1, PRKAR1A, PTCH1, PTEN, RAD50, RAD51C, RAD51D, RB1, RECQL4, RET, RUNX1, SDHAF2, SDHA (sequence changes only), SDHB, SDHC, SDHD, SMAD4, SMARCA4, SMARCB1, SMARCE1, STK11, SUFU, TERC, TERT, TMEM127, TP53, TSC1, TSC2, VHL, WRN and WT1. .  2 variants of uncertain significance (VUS) in genes called RECQL4c.2545G>A and TERT  C.779G>A were also noted.  The test report will be scanned into EPIC and will be located under the Molecular Pathology section of the Results Review tab.A portion of the result report is included below for reference.      We discussed with Ms. Mault that because current genetic testing is not perfect, it is possible there may be a gene mutation in one of these genes that current testing cannot detect, but that chance is small. We also discussed, that there could be another gene that has not yet been discovered, or that we have not yet tested, that is responsible for the cancer diagnoses in the family. Therefore, it is important to remain in touch with cancer genetics in the future so that we can continue to offer Ms. Dieterich the most up to date genetic testing.   Regarding the VUS's in RECQL4 and TERT: At this time, it is unknown if these variants are associated with increased cancer risk or if they are a normal findings, but most variants such as this get reclassified to being inconsequential. They should not be used to make medical management decisions. With time, we suspect the  lab will determine the significance of this variant, if any. If we do learn more about it, we will try to contact Ms. Seoane to discuss it further. However, it is important to stay in touch with [place] periodically and keep the address and phone number up to date.  ADDITIONAL GENETIC TESTING: We discussed with Ms. Elpers that her genetic testing was fairly extensive.  If there are are genes identified to increase cancer that can be analyzed in the future, we would be happy to discuss and coordinate this testing at that time.    CANCER SCREENING RECOMMENDATIONS: Based on these negative genetic test results, there areno additional cancer risks we are aware of for Ms. Flanary, and no additional screening or medical management that we would recommend for her at this time based on genetic testing.    This result indicates that it is unlikely Ms. Plaza has an increased risk for a future cancer due to a mutation in one of these genes. This normal test also suggests that Ms. Pinn's cancer was most likely not due to an inherited predisposition associated with one of these genes.  Most cancers happen by chance and this negative test suggests that her cancer may fall into this category.  Therefore, it is recommended she continue to follow the cancer management and screening guidelines provided by her oncology and primary healthcare provider. Other factors such as her personal and family history may still affect her cancer risk.  RECOMMENDATIONS FOR FAMILY MEMBERS: Women in this family might be at some increased risk of developing cancer, over the general population risk, simply due to the family history of cancer. We recommended women in this family have a yearly mammogram beginning at age 69, or 49 years younger than the earliest onset of cancer, an annual clinical breast exam, and perform monthly breast self-exams. Women in this family should also have a gynecological exam as recommended by their primary provider.  All family members should have a colonoscopy by age 55.  Based on Ms. Swails's family history, we recommended her sister and maternal aunt, who were diagnosed with breast cancer as well as her maternal cousin with pancreatic cancer have genetic counseling and testing.  Her other maternal relatives appear to meet criteria for genetic testing as well based on family history.  Ms. Bittinger will let us know if we can be of any assistance in coordinating genetic counseling and/or testing for these family members.   FOLLOW-UP: Lastly, we discussed with Ms. Sagun that cancer genetics is a rapidly advancing field and it is possible that new genetic tests will be appropriate for her and/or her family members in the future. We encouraged her to remain in contact with cancer genetics on an annual basis so we can update her personal and family histories and let her know of advances in cancer genetics that may benefit this family.   Our contact number was provided. Ms. Roma's questions were answered to her satisfaction, and she knows she is welcome to call us at anytime with additional questions or concerns.   Ferol Luz, MS Genetic Counselor [email or web address]

## 2016-11-29 ENCOUNTER — Other Ambulatory Visit: Payer: Self-pay | Admitting: Hematology

## 2016-12-04 NOTE — Op Note (Signed)
Preoperative diagnosis: Stage I right breast cancer lower -outer quadrant   Postoperative diagnosis: Same   Procedure: right breast seed localized lumpectomy with deep deep  Level 1  right  axillary sentinel lymph node mapping with methylene blue dye    Surgeon: Erroll Luna M.D .  Anesthesia: LMA with pectoral block anesthesia and local   EBL: 20 cc   Specimen: right breast mass with clip and seed to pathology and one axillary sentinel node hot and blue  Drains: None   Indications for procedure: Patient presents for treatment of her right breast cancer. She has opted for breast conservation after lengthy discussion of treatment options to include breast conservation surgery and mastectomy and reconstruction. Risks, benefits and alternatives discussed with the patient.The procedure has been discussed with the patient. Alternatives to surgery have been discussed with the patient. Risks of surgery include bleeding, Infection, Seroma formation, death, and the need for further surgery. The patient understands and wishes to proceed.Sentinel lymph node mapping and dissection has been discussed with the patient. Risk of bleeding, Infection, Seroma formation, Additional procedures,, Shoulder weakness , Shoulder stiffness, Nerve and blood vessel injury and reaction to the mapping dyes have been discussed. Alternatives to surgery have been discussed with the patient. The patient agrees to proceed.   Description of procedure: Patient underwent placement of right breast seed atradiology earlier in the week. She presents to the holding area and questions are answered. Neoprobe was used to verify clip placement in the left breast. Patient underwent technetium sulfur colloid injection per protocol. Questions answered. Patient taken back to operating room and placed supine on the operating room table. Patient received 2 g of Ancef. After induction of LMA anesthesia right breast was prepped and  draped in a sterile fashion and 4 cc of methylene blue dye were injected in a subareolar position. Of note, patient had pectoral block by anesthesia prior to this. Neoprobe was used to identify the radioactive seen in the  right upper-outer quadrant. Curvilinear incision made around the NAC laterally and dissection was carried around to excise all tissue around both the clip and seed. Radiograph showed the mass with gross negative margins. Both seedand clip were in the specimen. Specimen sent to pathology.  Neoprobe was switched to the technetium sulfur colloid setting. Hot spot identified in the  Right axilla. Incision made in the inferior axillary hairline and dissection carried into the axilla. Deep level 1  Hot and blue lymph node identified and excised. Background counts approached 0. Wound was irrigated and closed with 3-0 Vicryl and 4-0 Monocryl. Lumpectomy site closed in a similar fashion. Dermabond applied. All final counts sponge, needle instruments found to be correct at this point. Patient awoke, taken to recovery in satisfactory condition.

## 2016-12-04 NOTE — Op Note (Signed)
Preoperative diagnosis: Stage I right breast cancer lower -outer quadrant   Postoperative diagnosis: Same   Procedure: right breast seed localized lumpectomy with right  axillary  Deep deep sentinel lymph node mapping with methylene blue dye    Surgeon: Erroll Luna M.D .  Anesthesia: LMA with pectoral block anesthesia and local   EBL: 20 cc   Specimen: right breast mass with clip and seed to pathology and one axillary sentinel node hot and blue  Drains: None   Indications for procedure: Patient presents for treatment of her right breast cancer. She has opted for breast conservation after lengthy discussion of treatment options to include breast conservation surgery and mastectomy and reconstruction. Risks, benefits and alternatives discussed with the patient.The procedure has been discussed with the patient. Alternatives to surgery have been discussed with the patient. Risks of surgery include bleeding, Infection, Seroma formation, death, and the need for further surgery. The patient understands and wishes to proceed.Sentinel lymph node mapping and dissection has been discussed with the patient. Risk of bleeding, Infection, Seroma formation, Additional procedures,, Shoulder weakness , Shoulder stiffness, Nerve and blood vessel injury and reaction to the mapping dyes have been discussed. Alternatives to surgery have been discussed with the patient. The patient agrees to proceed.   Description of procedure: Patient underwent placement of right breast seed atradiology earlier in the week. She presents to the holding area and questions are answered. Neoprobe was used to verify clip placement in the left breast. Patient underwent technetium sulfur colloid injection per protocol. Questions answered. Patient taken back to operating room and placed supine on the operating room table. Patient received 2 g of Ancef. After induction of LMA anesthesia right breast was prepped and draped  in a sterile fashion and 4 cc of methylene blue dye were injected in a subareolar position. Of note, patient had pectoral block by anesthesia prior to this. Neoprobe was used to identify the radioactive seen in the  right upper-outer quadrant. Curvilinear incision made around the NAC laterally and dissection was carried around to excise all tissue around both the clip and seed. Radiograph showed the mass with gross negative margins. Both seedand clip were in the specimen. Specimen sent to pathology.  Neoprobe was switched to the technetium sulfur colloid setting. Hot spot identified in the  Right axilla. Incision made in the inferior axillary hairline and dissection carried into the axilla.  Deep deep level 1 Hot and blue lymph node identified and excised. Background counts approached 0. Wound was irrigated and closed with 3-0 Vicryl and 4-0 Monocryl. Lumpectomy site closed in a similar fashion. Dermabond applied. All final counts sponge, needle instruments found to be correct at this point. Patient awoke, taken to recovery in satisfactory condition.

## 2016-12-07 ENCOUNTER — Other Ambulatory Visit: Payer: Self-pay | Admitting: Family Medicine

## 2016-12-12 NOTE — Progress Notes (Signed)
This encounter was created in error - please disregard.

## 2016-12-14 ENCOUNTER — Encounter: Payer: Medicare HMO | Admitting: Hematology

## 2016-12-14 ENCOUNTER — Other Ambulatory Visit: Payer: Medicare HMO

## 2016-12-15 ENCOUNTER — Encounter: Payer: Self-pay | Admitting: Hematology

## 2016-12-17 ENCOUNTER — Telehealth: Payer: Self-pay | Admitting: Hematology

## 2016-12-17 NOTE — Telephone Encounter (Signed)
Called patient to reschedule her missed appts. However she is out of town because her husband passed away and does not know how long it will take before she can come home. She said that she would call when she is back in town.

## 2016-12-30 NOTE — Progress Notes (Addendum)
[place] [facility] [address] [place], [place] [address] [phone]  Date:  01/02/2017   Name:  Rhonda Berg   DOB:  1940-11-22   MRN:  [id number]  PCP:  Darreld Mclean, MD    Chief Complaint: Follow-up   History of Present Illness:  Rhonda Berg is a 76 y.o. very pleasant female patient who presents with the following:  Here today for a follow-up visit Last seen by myself in April to follow-up her DM and discuss her breast cancer treatment She finished her radiation already- she did 6 weeks of treatment.    She last had a colonoscopy in 2015- she is not sure of when she was supposed to have a follow- up screening   Her glucose is under 200; she is trying to watch her diet but admits she has been eating too much cake The aromasin also causes weight gain; she is not sure how long she will need to be on this    Wt Readings from Last 3 Encounters:  08/02/16 285 lb 6.4 oz (129.5 kg)  07/09/16 274 lb 12.8 oz (124.6 kg)  06/25/16 269 lb 12.8 oz (122.4 kg)   She does have a psychiatrist who manages her bipolar disease   Most recent A1c was 9.9, previous 8.9 She is due for a flu shot, eye exam I was not able to reach her after our last visit to discuss her diabetes- will go over it today  Her husband of 4 years Rhonda Berg did pass away not long ago.  She is getting by "one day at a time."  She is feeling sad and lost without her life partner but denies any risk of self harm.  She is going through expected grief right now  She is on amantadine but has run out of this medication- she has been out for approx 2 months, she takes it BID for nerve pain.  Would like me to refill this   She notes that her glucose is "up and down," after her husband died her appetite was very poor for a while.   She is taking 48 units of lantus and also metformin 1000 BID, Januvia  Declines a flu shot She does plan to have her eyes  checked pretty soon- she just did not get around to it  She does have OSA, but is not using her machine right now,  She does not want to use it as she finds it to be too cumbersome    Patient Active Problem List   Diagnosis Date Noted  . Genetic testing 11/14/2016  . Family history of breast cancer   . Family history of pancreatic cancer   . Breast cancer of lower-outer quadrant of right female breast ([place]) 01/30/2016  . Uncontrolled diabetes mellitus type 2 without complications ([place]) 76/81/1572  . Peripheral edema 11/24/2015  . Diabetic polyneuropathy associated with type 2 diabetes mellitus ([place]) 11/24/2015  . Chronic back pain 11/24/2015  . Bipolar affective disorder in remission ([place]) 11/24/2015  . Hyperlipidemia 11/24/2015  . Wheezing 11/24/2015    Past Medical History:  Diagnosis Date  . Arthritis   . Bipolar 1 disorder ([place])   . Breast cancer ([place])   . Cancer ([place])    breast  . Depression   . Diabetes mellitus without complication ([place])   . Diabetic neuropathy ([place])   . Dyspnea   . Family history of breast cancer   .  Family history of pancreatic cancer   . History of radiation therapy 06/12/16- 07/23/16   Right Breast 50 Gy in 25 fractions. Right Breast boost 10Gy in 5 fractions  . Hypercholesteremia   . Hypertension   . Peripheral edema   . Proteinuria   . Sleep apnea    has CPAP but doesnt wear it    Past Surgical History:  Procedure Laterality Date  . ABDOMINAL HYSTERECTOMY    . BREAST LUMPECTOMY WITH RADIOACTIVE SEED AND SENTINEL LYMPH NODE BIOPSY Right 04/25/2016   Procedure: RIGHT BREAST LUMPECTOMY WITH RADIOACTIVE SEED AND SENTINEL LYMPH NODE BIOPSY;  Surgeon: Erroll Luna, MD;  Location: [facility];  Service: General;  Laterality: Right;  . COLONOSCOPY    . KNEE SURGERY     bilateral partial knee replacements    Social History  Substance Use Topics  . Smoking status: Former Smoker    Packs/day: 0.25    Years: 40.00    Quit date: 04/09/2002  . Smokeless  tobacco: Never Used  . Alcohol use No    Family History  Problem Relation Age of Onset  . Breast cancer Mother 23       early 56's, lived to 69  . Breast cancer Sister        first dx in 27's.  breast cancer 'came back' recently in her early 13's  . Breast cancer Maternal Aunt 66       She is now 1  . Cancer Cousin        colon cancer   . Cancer Maternal Uncle        type of cancer unknown, died from cancer in 40's  . Pneumonia Maternal Grandmother 30       died at 7  . Cancer Maternal Uncle        type of cancer unknown, died from cancer in 28's  . Cancer Maternal Uncle        type of cancer unknown, died from cancer in 51's    No Known Allergies  Medication list has been reviewed and updated.  Current Outpatient Prescriptions on File Prior to Visit  Medication Sig Dispense Refill  . albuterol (PROVENTIL HFA;VENTOLIN HFA) 108 (90 Base) MCG/ACT inhaler Inhale 2 puffs into the lungs every 6 (six) hours as needed for wheezing or shortness of breath. 1 Inhaler 6  . aspirin 81 MG tablet Take 81 mg by mouth daily.    . baclofen (LIORESAL) 10 MG tablet Take 1 tablet (10 mg total) by mouth at bedtime. 30 each 3  . BD PEN NEEDLE NANO U/F 32G X 4 MM MISC Check sugar 2-3 times daily    . Blood Glucose Monitoring Suppl MISC Pt uses one touch verio flex. Please dispense test strips #100 and lancets #100. She tests her sugar 2-3x daily 100 each 3  . clonazePAM (KLONOPIN) 1 MG tablet     . cyclobenzaprine (FLEXERIL) 5 MG tablet Take 1 tablet (5 mg total) by mouth at bedtime as needed for muscle spasms. 30 tablet 5  . divalproex (DEPAKOTE ER) 250 MG 24 hr tablet Take 250 mg by mouth daily.    [name] exemestane (AROMASIN) 25 MG tablet take 1 tablet by mouth once daily AFTER BREAKFAST 30 tablet 2  . furosemide (LASIX) 40 MG tablet take 1 tablet by mouth once daily 30 tablet 6  . gabapentin (NEURONTIN) 600 MG tablet Take 1 tablet (600 mg total) by mouth 4 (four) times daily. 120 tablet 11  .  metFORMIN (  GLUCOPHAGE) 1000 MG tablet Take 1 tablet (1,000 mg total) by mouth 2 (two) times daily with a meal. 180 tablet 3  . mirtazapine (REMERON) 15 MG tablet Take 15 mg by mouth at bedtime.    . propranolol (INDERAL) 10 MG tablet take 1 tablet by mouth twice a day 60 tablet 3  . simvastatin (ZOCOR) 40 MG tablet take 1 tablet by mouth every evening 30 tablet 6  . sitaGLIPtin (JANUVIA) 100 MG tablet Take 1 tablet (100 mg total) by mouth daily. 30 tablet 5   No current facility-administered medications on file prior to visit.     Review of Systems: No fever or chills No CP or SOB No nausea, vomiting or diarrhea No rash As per HPI- otherwise negative.   Physical Examination: Vitals:   01/02/17 1217  BP: 120/84  Pulse: 70  Temp: 98.1 F (36.7 C)  SpO2: 95%   Vitals:   01/02/17 1217  Weight: 249 lb (112.9 kg)  Height: 5\' 7"  (1.702 m)   Body mass index is 39 kg/m. Ideal Body Weight: Weight in (lb) to have BMI = 25: 159.3  GEN: WDWN, NAD, Non-toxic, A & O x 3, obese, looks well HEENT: Atraumatic, Normocephalic. Neck supple. No masses, No LAD. Ears and Nose: No external deformity. CV: RRR, No M/G/R. No JVD. No thrill. No extra heart sounds. PULM: CTA B, no wheezes, crackles, rhonchi. No retractions. No resp. distress. No accessory muscle use. ABD: S, NT, ND, +BS. No rebound. No HSM. EXTR: No c/c/e NEURO Normal gait.  PSYCH: Normally interactive. Conversant. Not depressed or anxious appearing.  Calm demeanor.    Assessment and Plan: Uncontrolled type 2 diabetes mellitus with diabetic neuropathy, with long-term current use of insulin ([place]) - Plan: Hemoglobin Y6A, Basic metabolic panel, LANTUS SOLOSTAR 100 UNIT/ML Solostar Pen  Dyslipidemia  Immunization due  Neuropathy - Plan: amantadine (SYMMETREL) 100 MG capsule  Feeling grief  Here today for a follow-up visit She is grieving the loss of her husband who died I believe earlier this month or perhaps in August.   Otherwise she is doing ok Refilled her amantadine Refilled her lantus Check labs today and will be in touch with her to plan next steps  Signed Lamar Blinks, MD  Received her labs  Results for orders placed or performed in visit on 01/02/17  Hemoglobin A1c  Result Value Ref Range   Hgb A1c MFr Bld 14.6 (H) 4.6 - 6.5 %  Basic metabolic panel  Result Value Ref Range   Sodium 137 135 - 145 mEq/L   Potassium 4.2 3.5 - 5.1 mEq/L   Chloride 97 96 - 112 mEq/L   CO2 29 19 - 32 mEq/L   Glucose, Bld 296 (H) 70 - 99 mg/dL   BUN 9 6 - 23 mg/dL   Creatinine, Ser 0.78 0.40 - 1.20 mg/dL   Calcium 9.9 8.4 - 10.5 mg/dL   GFR 92.68 >60.00 mL/min   Glucose is way out of control.  Will call her to discuss  She is compensated with an AG of 11   Called her back on 9/28- we are surprised that her A1c is so high. Admits that she has not been checking her blood sugars really since her husband died.  She will start doing so- asked her to check fasting am daily and occasional afternoon glucose- and she will call me at the beginning of next week.  We will then make a plan to adjust her lantus as needed Continue [place]  and metformin

## 2017-01-02 ENCOUNTER — Encounter: Payer: Self-pay | Admitting: Family Medicine

## 2017-01-02 ENCOUNTER — Ambulatory Visit (INDEPENDENT_AMBULATORY_CARE_PROVIDER_SITE_OTHER): Payer: Medicare HMO | Admitting: Family Medicine

## 2017-01-02 VITALS — BP 120/84 | HR 70 | Temp 98.1°F | Ht 67.0 in | Wt 249.0 lb

## 2017-01-02 DIAGNOSIS — Z23 Encounter for immunization: Secondary | ICD-10-CM

## 2017-01-02 DIAGNOSIS — IMO0002 Reserved for concepts with insufficient information to code with codable children: Secondary | ICD-10-CM

## 2017-01-02 DIAGNOSIS — E785 Hyperlipidemia, unspecified: Secondary | ICD-10-CM

## 2017-01-02 DIAGNOSIS — G629 Polyneuropathy, unspecified: Secondary | ICD-10-CM | POA: Diagnosis not present

## 2017-01-02 DIAGNOSIS — Z794 Long term (current) use of insulin: Secondary | ICD-10-CM

## 2017-01-02 DIAGNOSIS — F4320 Adjustment disorder, unspecified: Secondary | ICD-10-CM | POA: Diagnosis not present

## 2017-01-02 DIAGNOSIS — E1140 Type 2 diabetes mellitus with diabetic neuropathy, unspecified: Secondary | ICD-10-CM

## 2017-01-02 DIAGNOSIS — E1165 Type 2 diabetes mellitus with hyperglycemia: Secondary | ICD-10-CM | POA: Diagnosis not present

## 2017-01-02 DIAGNOSIS — F4321 Adjustment disorder with depressed mood: Secondary | ICD-10-CM

## 2017-01-02 LAB — BASIC METABOLIC PANEL
BUN: 9 mg/dL (ref 6–23)
CALCIUM: 9.9 mg/dL (ref 8.4–10.5)
CO2: 29 meq/L (ref 19–32)
Chloride: 97 mEq/L (ref 96–112)
Creatinine, Ser: 0.78 mg/dL (ref 0.40–1.20)
GFR: 92.68 mL/min (ref >60.00)
GLUCOSE: 296 mg/dL — AB (ref 70–99)
POTASSIUM: 4.2 meq/L (ref 3.5–5.1)
Sodium: 137 mEq/L (ref 135–145)

## 2017-01-02 LAB — HEMOGLOBIN A1C: Hgb A1c MFr Bld: 14.6 % — ABNORMAL HIGH (ref 4.6–6.5)

## 2017-01-02 MED ORDER — AMANTADINE HCL 100 MG PO CAPS: 100.0000 mg | ORAL_CAPSULE | Freq: Two times a day (BID) | ORAL | 4 refills | Status: AC

## 2017-01-02 MED ORDER — LANTUS SOLOSTAR 100 UNIT/ML ~~LOC~~ SOPN: PEN_INJECTOR | SUBCUTANEOUS | 5 refills | Status: AC

## 2017-01-02 NOTE — Patient Instructions (Signed)
It was great to see you today- I am so sorry for the loss of your sweet husband Meda Coffee.  He was a very nice man the few times that I was lucky enough to meet him  We will check on your diabetes today and I will be in touch with your results asap  Please do be sure to get your eyes checked before the year is out

## 2017-01-03 ENCOUNTER — Ambulatory Visit (HOSPITAL_BASED_OUTPATIENT_CLINIC_OR_DEPARTMENT_OTHER): Payer: Medicare HMO | Admitting: Hematology

## 2017-01-03 ENCOUNTER — Telehealth: Payer: Self-pay | Admitting: Hematology

## 2017-01-03 ENCOUNTER — Encounter: Payer: Self-pay | Admitting: Hematology

## 2017-01-03 ENCOUNTER — Other Ambulatory Visit (HOSPITAL_BASED_OUTPATIENT_CLINIC_OR_DEPARTMENT_OTHER): Payer: Medicare HMO

## 2017-01-03 VITALS — BP 113/75 | HR 73 | Temp 98.6°F | Resp 18 | Ht 67.0 in | Wt 279.1 lb

## 2017-01-03 DIAGNOSIS — Z79811 Long term (current) use of aromatase inhibitors: Secondary | ICD-10-CM | POA: Diagnosis not present

## 2017-01-03 DIAGNOSIS — F3170 Bipolar disorder, currently in remission, most recent episode unspecified: Secondary | ICD-10-CM

## 2017-01-03 DIAGNOSIS — C50511 Malignant neoplasm of lower-outer quadrant of right female breast: Secondary | ICD-10-CM | POA: Diagnosis not present

## 2017-01-03 DIAGNOSIS — E119 Type 2 diabetes mellitus without complications: Secondary | ICD-10-CM

## 2017-01-03 DIAGNOSIS — Z170 Estrogen receptor positive status [ER+]: Principal | ICD-10-CM

## 2017-01-03 DIAGNOSIS — F319 Bipolar disorder, unspecified: Secondary | ICD-10-CM | POA: Diagnosis not present

## 2017-01-03 DIAGNOSIS — E1142 Type 2 diabetes mellitus with diabetic polyneuropathy: Secondary | ICD-10-CM

## 2017-01-03 DIAGNOSIS — M1990 Unspecified osteoarthritis, unspecified site: Secondary | ICD-10-CM | POA: Diagnosis not present

## 2017-01-03 LAB — COMPREHENSIVE METABOLIC PANEL
ALT: 22 U/L (ref 0–55)
AST: 14 U/L (ref 5–34)
Albumin: 3.9 g/dL (ref 3.5–5.0)
Alkaline Phosphatase: 70 U/L (ref 40–150)
Anion Gap: 10 mEq/L (ref 3–11)
BUN: 7.3 mg/dL (ref 7.0–26.0)
CHLORIDE: 103 meq/L (ref 98–109)
CO2: 27 mEq/L (ref 22–29)
Calcium: 9.6 mg/dL (ref 8.4–10.4)
Creatinine: 0.9 mg/dL (ref 0.6–1.1)
EGFR: 76 mL/min/{1.73_m2} — ABNORMAL LOW (ref >90)
GLUCOSE: 284 mg/dL — AB (ref 70–140)
POTASSIUM: 4.1 meq/L (ref 3.5–5.1)
SODIUM: 141 meq/L (ref 136–145)
Total Bilirubin: 0.31 mg/dL (ref 0.20–1.20)
Total Protein: 7 g/dL (ref 6.4–8.3)

## 2017-01-03 LAB — CBC WITH DIFFERENTIAL/PLATELET
BASO%: 0.7 % (ref 0.0–2.0)
Basophils Absolute: 0 10*3/uL (ref 0.0–0.1)
EOS ABS: 0 10*3/uL (ref 0.0–0.5)
EOS%: 1.1 % (ref 0.0–7.0)
HCT: 47.5 % — ABNORMAL HIGH (ref 34.8–46.6)
HGB: 15.6 g/dL (ref 11.6–15.9)
LYMPH%: 34.3 % (ref 14.0–49.7)
MCH: 28.8 pg (ref 25.1–34.0)
MCHC: 32.9 g/dL (ref 31.5–36.0)
MCV: 87.6 fL (ref 79.5–101.0)
MONO#: 0.4 10*3/uL (ref 0.1–0.9)
MONO%: 8.1 % (ref 0.0–14.0)
NEUT%: 55.8 % (ref 38.4–76.8)
NEUTROS ABS: 2.4 10*3/uL (ref 1.5–6.5)
Platelets: 168 10*3/uL (ref 145–400)
RBC: 5.42 10*6/uL (ref 3.70–5.45)
RDW: 14.5 % (ref 11.2–14.5)
WBC: 4.4 10*3/uL (ref 3.9–10.3)
lymph#: 1.5 10*3/uL (ref 0.9–3.3)

## 2017-01-03 NOTE — Progress Notes (Signed)
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 The axillary adenopathy.       01/13/2016 Initial Diagnosis    Breast cancer of lower-outer quadrant of right female breast ([place])      01/13/2016 Initial Biopsy    Right breast 8:00 position two biopsies showed invasive ductal carcinoma, and intermediate grade DCIS.      01/13/2016 Receptors her2    ER 100% positive, PR 100% positive, HER-2 negative, Ki-67 12%       03/08/2016 -  Neo-Adjuvant Anti-estrogen oral therapy    Exemestane 25 mg daily      04/25/2016 Pathology Results    Right Lumpectomy and Right SLN Biopsy 04/25/2016 1. Breast, lumpectomy, Right INVASIVE DUCTAL CARCINOMA, GRADE 2, SPANNING 2.7 CM  DUCTAL CARCINOMA IN SITU, GRADE 1 ALL MARGINS OF RESECTION ARE NEGATIVE FOR CARCINOMA 2. Lymph node, sentinel, biopsy, Right axillary ONE BENIGN LYMPH NODE (0/1) 3.  Breast, excision, Right additional anterior margin BENIGN BREAST TISSUE      04/25/2016 Oncotype testing    Oncotype DX score of 2, low risk. 10 year risk of distant recurrence of 4% on Tamoxifen alone.      04/25/2016 Surgery    Right breast lumpectomy with radioactive seed and sentinel lymph node biopsy by Dr. Brantley Stage      06/12/2016 - 07/23/2016 Radiation Therapy    Site/dose:   Right Breast treated to 50 Gy in 25 fractions, then Boosted an additional 10 Gy in 5 fractions      11/14/2016 Genetic Testing    Patient was tested due to a personal history of breast cancer and a family history of breast and pancreatic cancer.  The Multi-Cancer Panel was ordered.  The Multi-Cancer Panel offered by [facility] includes sequencing and/or deletion duplication testing of the following 83 genes: ALK, APC, ATM, AXIN2,BAP1,  BARD1, BLM, BMPR1A, BRCA1, BRCA2, BRIP1, CASR, CDC73, CDH1, CDK4, CDKN1B, CDKN1C, CDKN2A (p14ARF), CDKN2A (p16INK4a), CEBPA, CHEK2, CTNNA1, DICER1, DIS3L2, EGFR (c.2369C>T, p.Thr790Met variant only), EPCAM (Deletion/duplication testing only), FH, FLCN, GATA2, GPC3, GREM1 (Promoter region deletion/duplication testing only), HOXB13 (c.251G>A, p.Gly84Glu), HRAS, KIT, MAX, MEN1, MET, MITF (c.952G>A, p.Glu318Lys variant only), MLH1, MSH2, MSH3, MSH6, MUTYH, NBN, NF1, NF2, NTHL1, PALB2, PDGFRA, PHOX2B, PMS2, POLD1, POLE, POT1, PRKAR1A, PTCH1, PTEN, RAD50, RAD51C, RAD51D, RB1, RECQL4, RET, RUNX1, SDHAF2, SDHA (sequence changes only), SDHB, SDHC, SDHD, SMAD4, SMARCA4, SMARCB1, SMARCE1, STK11, SUFU, TERC, TERT, TMEM127, TP53, TSC1, TSC2, VHL, WRN and WT1.   Results: No pathogenic mutations identified.  VUS's in RECQL4 c.2545G>A (p.Val849Met) and TERT c.779G>A (p.Gly260Asp) were identified.  The date of this report is 11/14/2016.      HISTORY OF PRESENTING ILLNESS:  Rhonda Berg 76 y.o. female with past medical history of diabetes, arthritis, bipolar, is here because of her recently diagnosed right  breast cancer. She presents to my clinic by herself today.  She has been having routine annual mammogram which has been normal. she felt a right breast lump about one month ago, no significant tenderness, no skin change or nipple discharge. she was seen by her PCP and was sent for diagnostic mammogram and ultrasound, which showed multiple masses in the lower outer quadrant of right breast, the largest one 1.6cm. no axillary adenopathy. 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 She was referred to breast surgeon Dr. Brantley Stage, and was offered lumpectomy and sentinel lymph node biopsy.   She has chronic back pain, uses a crane to get around. She is married, lives with her husband and daughter, her husband has health issue also, so they move to their daughter's house in 06/2015. She is able to do housework, cooking, laundry, etc. she does not exercise regularly, but remains to be moderately physically active.  She has history of bipolar, depression and anxiety, on 3 medications, and her mood disorder seems to be controlled. She also has type 2 diabetes, used to be on insulin, now on metformin alone. She has strong family history of breast cancer.   GYN HISTORY  Menarchal: 10 LMP: 30's (she had hysterectomy for fibroids) Contraceptive: none  HRT: none  G2P1: one miscarriage, one daughter who is 58 yo   CURRENT THERAPY: Exemestane since 03/08/16. Held during radiation, and restarted on 07/23/2016  INTERIM HISTORY:  Rhonda Berg returns for follow-up. She missed her last appointment due to her husband passing. During that time, she was not able to eat. She says her appetite is still not back yet and she isn't sleeping well.  She is complaint with taking Exemestane and denies any problems. If she forgets to take it she has family members who remind  her She denies any joint problems or hot flashes.    MEDICAL HISTORY:  Past Medical History:  Diagnosis Date  . Arthritis   . Bipolar 1 disorder ([place])   . Breast cancer ([place])   . Cancer ([place])    breast  . Depression   . Diabetes mellitus without complication ([place])   . Diabetic neuropathy ([place])   . Dyspnea   . Family history of breast cancer   . Family history of pancreatic cancer   . History of radiation therapy 06/12/16- 07/23/16   Right Breast 50 Gy in 25 fractions. Right Breast boost 10Gy in 5 fractions  . Hypercholesteremia   . Hypertension   . Peripheral edema   . Proteinuria   . Sleep apnea    has CPAP but doesnt wear it    SURGICAL HISTORY: Past Surgical History:  Procedure Laterality Date  . ABDOMINAL HYSTERECTOMY    . BREAST LUMPECTOMY WITH RADIOACTIVE SEED AND SENTINEL LYMPH NODE BIOPSY Right 04/25/2016   Procedure: RIGHT BREAST LUMPECTOMY WITH RADIOACTIVE SEED AND SENTINEL LYMPH NODE BIOPSY;  Surgeon: Erroll Luna, MD;  Location: [facility];  Service: General;  Laterality: Right;  . COLONOSCOPY    . KNEE SURGERY     bilateral partial knee replacements  SOCIAL HISTORY: Social History   Social History  . Marital status: Married    Spouse name: N/A  . Number of children: N/A  . Years of education: N/A   Occupational History  . Not on file.   Social History Main Topics  . Smoking status: Former Smoker    Packs/day: 0.25    Years: 40.00    Quit date: 04/09/2002  . Smokeless tobacco: Never Used  . Alcohol use No  . Drug use: No     Comment: when she was in [age]   . Sexual activity: Not on file   Other Topics Concern  . Not on file   Social History Narrative   Retired [employer].     Highest education:  Master's degree   She lives with her daughter and husband.     FAMILY HISTORY: Family History  Problem Relation Age of Onset  . Breast cancer Mother 43       early 29's, lived to 34  . Breast cancer Sister        first dx in 66's.   breast cancer 'came back' recently in her early 24's  . Breast cancer Maternal Aunt 66       She is now 15  . Cancer Cousin        colon cancer   . Cancer Maternal Uncle        type of cancer unknown, died from cancer in 62's  . Pneumonia Maternal Grandmother 79       died at 14  . Cancer Maternal Uncle        type of cancer unknown, died from cancer in 30's  . Cancer Maternal Uncle        type of cancer unknown, died from cancer in 45's    ALLERGIES:  has No Known Allergies.  MEDICATIONS:  Current Outpatient Prescriptions  Medication Sig Dispense Refill  . albuterol (PROVENTIL HFA;VENTOLIN HFA) 108 (90 Base) MCG/ACT inhaler Inhale 2 puffs into the lungs every 6 (six) hours as needed for wheezing or shortness of breath. 1 Inhaler 6  . amantadine (SYMMETREL) 100 MG capsule Take 1 capsule (100 mg total) by mouth 2 (two) times daily. 180 capsule 4  . aspirin 81 MG tablet Take 81 mg by mouth daily.    . baclofen (LIORESAL) 10 MG tablet Take 1 tablet (10 mg total) by mouth at bedtime. 30 each 3  . BD PEN NEEDLE NANO U/F 32G X 4 MM MISC Check sugar 2-3 times daily    . Blood Glucose Monitoring Suppl MISC Pt uses one touch verio flex. Please dispense test strips #100 and lancets #100. She tests her sugar 2-3x daily 100 each 3  . clonazePAM (KLONOPIN) 1 MG tablet     . cyclobenzaprine (FLEXERIL) 5 MG tablet Take 1 tablet (5 mg total) by mouth at bedtime as needed for muscle spasms. 30 tablet 5  . divalproex (DEPAKOTE ER) 250 MG 24 hr tablet Take 250 mg by mouth daily.    [name] exemestane (AROMASIN) 25 MG tablet take 1 tablet by mouth once daily AFTER BREAKFAST 30 tablet 2  . furosemide (LASIX) 40 MG tablet take 1 tablet by mouth once daily 30 tablet 6  . gabapentin (NEURONTIN) 600 MG tablet Take 1 tablet (600 mg total) by mouth 4 (four) times daily. 120 tablet 11  . LANTUS SOLOSTAR 100 UNIT/ML Solostar Pen Take 48 units subcutaneously once daily.  Adjust as needed 15 mL 5  . metFORMIN  (  GLUCOPHAGE) 1000 MG tablet Take 1 tablet (1,000 mg total) by mouth 2 (two) times daily with a meal. 180 tablet 3  . mirtazapine (REMERON) 15 MG tablet Take 15 mg by mouth at bedtime.    . propranolol (INDERAL) 10 MG tablet take 1 tablet by mouth twice a day 60 tablet 3  . simvastatin (ZOCOR) 40 MG tablet take 1 tablet by mouth every evening 30 tablet 6  . sitaGLIPtin (JANUVIA) 100 MG tablet Take 1 tablet (100 mg total) by mouth daily. 30 tablet 5   No current facility-administered medications for this visit.     REVIEW OF SYSTEMS:   Constitutional: Denies fevers, chills or abnormal night sweats (+)poor appetite (+)insomina  Eyes: Denies blurriness of vision, double vision or watery eyes Ears, nose, mouth, throat, and face: Denies mucositis or sore throat Respiratory: Denies cough (+) dyspnea on exertion Cardiovascular: Denies palpitation, chest discomfort or lower extremity swelling Gastrointestinal:  Denies nausea, heartburn or change in bowel habits Skin: Denies abnormal skin rashes Lymphatics: Denies new lymphadenopathy or easy bruising Neurological:Denies numbness, tingling or new weaknesses Behavioral/Psych: Mood is stable, no new changes  All other systems were reviewed with the patient and are negative.  PHYSICAL EXAMINATION:  ECOG PERFORMANCE STATUS: 1 - Symptomatic but completely ambulatory  Vitals:   01/03/17 1417  BP: 113/75  Pulse: 73  Resp: 18  Temp: 98.6 F (37 C)  TempSrc: Oral  SpO2: 92%  Weight: 279 lb 1.6 oz (126.6 kg)  Height: 5' 7"  (1.702 m)    GENERAL:alert, no distress and comfortable SKIN: skin color, texture, turgor are normal, no rashes or significant lesions EYES: normal, conjunctiva are pink and non-injected, sclera clear OROPHARYNX:no exudate, no erythema and lips, buccal mucosa, and tongue normal  NECK: supple, thyroid normal size, non-tender, without nodularity LYMPH:  no palpable lymphadenopathy in the cervical, axillary or inguinal LUNGS:  clear to auscultation and percussion with normal breathing effort HEART: regular rate & rhythm and no murmurs and no lower extremity edema ABDOMEN:abdomen soft, non-tender and normal bowel sounds Musculoskeletal:no cyanosis of digits and no clubbing  PSYCH: alert & oriented x 3 with fluent speech NEURO: no focal motor/sensory deficits Breasts: Breast inspection showed a right axillary incision and right breast incision, both have healed well, palpitation of bilateral breast and axilla show no mass or adenopathy. (+)skin pigmentation of right breast, no palpable mass  LABORATORY DATA:  I have reviewed the data as listed CBC Latest Ref Rng & Units 01/03/2017 11/01/2016 09/14/2016  WBC 3.9 - 10.3 10e3/uL 4.4 4.8 5.5  Hemoglobin 11.6 - 15.9 g/dL 15.6 14.7 14.6  Hematocrit 34.8 - 46.6 % 47.5(H) 45.0 44.4  Platelets 145 - 400 10e3/uL 168 177 186   CMP Latest Ref Rng & Units 01/03/2017 01/02/2017 11/01/2016  Glucose 70 - 140 mg/dl 284(H) 296(H) 293(H)  BUN 7.0 - 26.0 mg/dL 7.3 9 10.1  Creatinine 0.6 - 1.1 mg/dL 0.9 0.78 0.9  Sodium 136 - 145 mEq/L 141 137 140  Potassium 3.5 - 5.1 mEq/L 4.1 4.2 4.3  Chloride 96 - 112 mEq/L - 97 -  CO2 22 - 29 mEq/L 27 29 27   Calcium 8.4 - 10.4 mg/dL 9.6 9.9 9.6  Total Protein 6.4 - 8.3 g/dL 7.0 - 7.4  Total Bilirubin 0.20 - 1.20 mg/dL 0.31 - 0.27  Alkaline Phos 40 - 150 U/L 70 - 68  AST 5 - 34 U/L 14 - 11  ALT 0 - 55 U/L 22 - 18   Pathology Report Oncotype  DX 04/25/2016   Diagnosis 04/25/2016 1. Breast, lumpectomy, Right INVASIVE DUCTAL CARCINOMA, GRADE 2, SPANNING 2.7 CM DUCTAL CARCINOMA IN SITU, GRADE 1 ALL MARGINS OF RESECTION ARE NEGATIVE FOR CARCINOMA 2. Lymph node, sentinel, biopsy, Right axillary ONE BENIGN LYMPH NODE (0/1) 3. Breast, excision, Right additional anterior margin BENIGN BREAST TISSUE Microscopic Comment 1. BREAST, INVASIVE TUMOR Procedure: Lumpectomy Laterality: Right Tumor Size: 2.7 cm Histologic Type: Ductal carcinoma Grade:  2 Tubular Differentiation: 2 Nuclear Pleomorphism: 2 Mitotic Count: 2 Ductal Carcinoma in Situ (DCIS): Present Extent of Tumor: Skin: Negative Nipple: Negative Skeletal muscle: Negative Margins: Invasive carcinoma, distance from closest margin: 0.5 cm from inferior margins DCIS, distance from closest margin: 0.1 mm from lateral, posterior and inferior margin Regional Lymph Nodes: Number of Lymph Nodes Examined: 1 Number of Sentinel Lymph Nodes Examined: 1 Lymph Nodes with Macrometastases: 0 Lymph Nodes with Micrometastases: 0 1 of 3 FINAL for Rhonda Berg, Rhonda Berg ([id number]) Microscopic Comment(continued) Lymph Nodes with Isolated Tumor Cells: 0 Breast Prognostic Profile: [id number] Estrogen Receptor: 100% Progesterone Receptor: 100% Her2: Negative Ki-67: 12% Pathologic Stage Classification (pTNM, AJCC 8th Edition): Primary Tumor (pT): pT2 Regional Lymph Nodes (pN): pN0 Distant Metastases (pM): x Casimer Lanius MD Pathologist, Electronic Signature (Case signed 04/26/2016) IAGNOSIS Diagnosis 01/13/2016 1. 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 2. 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 Microscopic Comment 1. and 2. While grading is best performed one the resection specimen, the invasive and in situ carcinoma appear grade 2. Prognostic markers will be ordered. Dr. Saralyn Pilar has reviewed the case. The case was called to [facility] on 01/16/2016. Results: IMMUNOHISTOCHEMICAL AND MORPHOMETRIC ANALYSIS PERFORMED MANUALLY Estrogen Receptor: 100%, POSITIVE, STRONG STAINING INTENSITY Progesterone Receptor: 100%, POSITIVE, STRONG STAINING INTENSITY Proliferation Marker Ki67: 12% Results: HER2 - NEGATIVE RATIO OF HER2/CEP17 SIGNALS 1.62 AVERAGE  HER2 COPY NUMBER PER CELL 3.65    RADIOGRAPHIC STUDIES: I have personally reviewed the radiological images as listed and agreed with the findings in the report. No results found.  ASSESSMENT & PLAN:  76 y.o. post menopause Caucasian female, with past medical history of diabetes, arthritis, bipolar, presented with a palpable right breast masses  1. 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 The margins were clear. 1 biopsied right axillary sentinel lymph node was negative.  -the Oncotype Dx result was reviewed with her in details. She has low risk based on the recurrence score, which predicts 10 year distant recurrence after 5 years of tamoxifen 4%. There is no benefit of adjuvant chemotherapy in low risk disease, I do not recommend adjuvant chemotherapy. She is quite happy to hear the news. -She has completed adjuvant breast radiation -She is on adjuvant Exemestane, she is tolerating well, we'll continue for 5 years. -We discussed breast cancer surveillance, she will continue annual screening mammogram, I encouraged her to do self exam, and follow-up with [place] routinely. - she has mammogram and DEXA scan scheduled for 01/14/17 - f/u in 6 months  - I recommend she receive a flu shot, she will think about it  2. DM -Much  improved lately, she will continue follow-up with her primary care physician  3. Bipolar disorder -Seem to be well controlled, continue medication  4. Arthritis -She has mild chronic back pain and knee pain, tolerable, she uses a cane   PLAN -Lab reviewed. She will continue exemestane - mammogram and DEXA scan 10/8 - f/u in 6 months    All questions were answered. The patient knows to call the clinic  with any problems, questions or concerns.  I spent 15 minutes counseling the patient face to face. The total time spent in the appointment was 20 minutes and more than 50% was on counseling.     Truitt Merle, MD 01/03/2017   This document serves as a record of services personally performed by Truitt Merle, MD. It was created on her behalf by Brandt Loosen, a trained medical scribe. The creation of this record is based on the scribe's personal observations and the provider's statements to them. This document has been checked and approved by the attending provider.

## 2017-01-03 NOTE — Telephone Encounter (Signed)
Gave avs and calendar for march 2019 

## 2017-01-04 ENCOUNTER — Encounter: Payer: Self-pay | Admitting: Hematology

## 2017-01-04 DIAGNOSIS — F330 Major depressive disorder, recurrent, mild: Secondary | ICD-10-CM | POA: Diagnosis not present

## 2017-01-14 ENCOUNTER — Other Ambulatory Visit: Payer: Self-pay

## 2017-02-05 DIAGNOSIS — F329 Major depressive disorder, single episode, unspecified: Secondary | ICD-10-CM | POA: Diagnosis not present

## 2017-02-05 DIAGNOSIS — R071 Chest pain on breathing: Secondary | ICD-10-CM | POA: Diagnosis not present

## 2017-02-05 DIAGNOSIS — E119 Type 2 diabetes mellitus without complications: Secondary | ICD-10-CM | POA: Diagnosis not present

## 2017-02-05 DIAGNOSIS — M19011 Primary osteoarthritis, right shoulder: Secondary | ICD-10-CM | POA: Diagnosis not present

## 2017-02-05 DIAGNOSIS — M25511 Pain in right shoulder: Secondary | ICD-10-CM | POA: Diagnosis not present

## 2017-02-05 DIAGNOSIS — Z7984 Long term (current) use of oral hypoglycemic drugs: Secondary | ICD-10-CM | POA: Diagnosis not present

## 2017-02-05 DIAGNOSIS — I10 Essential (primary) hypertension: Secondary | ICD-10-CM | POA: Diagnosis not present

## 2017-02-05 DIAGNOSIS — E559 Vitamin D deficiency, unspecified: Secondary | ICD-10-CM | POA: Diagnosis not present

## 2017-02-05 DIAGNOSIS — M1990 Unspecified osteoarthritis, unspecified site: Secondary | ICD-10-CM | POA: Diagnosis not present

## 2017-02-05 DIAGNOSIS — B330 Epidemic myalgia: Secondary | ICD-10-CM | POA: Diagnosis not present

## 2017-02-05 DIAGNOSIS — E876 Hypokalemia: Secondary | ICD-10-CM | POA: Diagnosis not present

## 2017-02-05 DIAGNOSIS — E1151 Type 2 diabetes mellitus with diabetic peripheral angiopathy without gangrene: Secondary | ICD-10-CM | POA: Diagnosis not present

## 2017-02-05 DIAGNOSIS — F319 Bipolar disorder, unspecified: Secondary | ICD-10-CM | POA: Diagnosis not present

## 2017-02-05 DIAGNOSIS — E1165 Type 2 diabetes mellitus with hyperglycemia: Secondary | ICD-10-CM | POA: Diagnosis not present

## 2017-02-05 DIAGNOSIS — E785 Hyperlipidemia, unspecified: Secondary | ICD-10-CM | POA: Diagnosis not present

## 2017-02-05 DIAGNOSIS — Z79899 Other long term (current) drug therapy: Secondary | ICD-10-CM | POA: Diagnosis not present

## 2017-02-05 DIAGNOSIS — Z90711 Acquired absence of uterus with remaining cervical stump: Secondary | ICD-10-CM | POA: Diagnosis not present

## 2017-02-19 DIAGNOSIS — Z79899 Other long term (current) drug therapy: Secondary | ICD-10-CM | POA: Diagnosis not present

## 2017-02-19 DIAGNOSIS — Z7984 Long term (current) use of oral hypoglycemic drugs: Secondary | ICD-10-CM | POA: Diagnosis not present

## 2017-02-19 DIAGNOSIS — M545 Low back pain: Secondary | ICD-10-CM | POA: Diagnosis not present

## 2017-02-19 DIAGNOSIS — R109 Unspecified abdominal pain: Secondary | ICD-10-CM | POA: Diagnosis not present

## 2017-02-19 DIAGNOSIS — E119 Type 2 diabetes mellitus without complications: Secondary | ICD-10-CM | POA: Diagnosis not present

## 2017-02-19 DIAGNOSIS — M549 Dorsalgia, unspecified: Secondary | ICD-10-CM | POA: Diagnosis not present

## 2017-02-20 DIAGNOSIS — R109 Unspecified abdominal pain: Secondary | ICD-10-CM | POA: Diagnosis not present

## 2017-05-08 ENCOUNTER — Ambulatory Visit: Payer: Self-pay | Admitting: Neurology

## 2017-05-08 NOTE — Progress Notes (Deleted)
Follow-up Visit   Date: 05/08/17    [name] MRN: [id number] DOB: [birth year]   Interim History: Rhonda Berg is a 77 y.o. right-handed African American female with bipolar disorder, right breast cancer (Oct 2017), hyperlipidemia, hypertension, insulin dependent diabetes mellitus, chronic low back pain, and former smoker returning to the clinic for follow-up of neuropathy, tremors, and low back pain.  The patient was accompanied to the clinic by self.  History of present illness: She began experiencing burning pain of the legs, below the knees in 2014.  Pain is constant and worse when she is not on gabapentin.  Since starting gabapentin 600mg  four times daily, she has excellent pain relief.  There is no numbness or similar pain the hands.  She denies any weakness of the legs. She had NCS/EMG of the legs in the spring of 2017, but does not know the results of that test.   She walks with a cane for the past 2 years because of low back pain.  Pain is worse with activity and walking and improved with rest.  She denies any radicular pain into her legs. She has had a number of MRIs of the lumbar spine which I do not have to reviews, but patient states she has "arthritis".  She has never had back surgery or has steroid injections.  She has tried physical therapy several times, but denies any improvement.    She was previously seeing neurologist, Dr. Modena Morrow, in [place], [place] and moved to [place] in 2017. Unfortunately, I do not have any of these records  She also takes amantadine 100mg  twice daily for tardive dyskinesia.    UPDATE 11/05/2016:  She is here for 6 month follow-up visit.  She continues to have low back pain which did not respond to zanaflex and is not interested in physical therapy.  She complains of intermittent tremors, especially when trying to sign papers. She was started on propranolol 10mg  BID in April and has not noticed a marked improvement.   She has  been on low dose depakote 250mg  for bipolar disease for sometime and sees Dr. Casimiro Needle who also manages her dyskinesias. Neuropathy is well-controlled on gabapentin 600mg  QID.    UPDATE 05/08/2017:  *** She is here for 6 month follow-up visit.    Medications:  Current Outpatient Medications on File Prior to Visit  Medication Sig Dispense Refill  . albuterol (PROVENTIL HFA;VENTOLIN HFA) 108 (90 Base) MCG/ACT inhaler Inhale 2 puffs into the lungs every 6 (six) hours as needed for wheezing or shortness of breath. 1 Inhaler 6  . amantadine (SYMMETREL) 100 MG capsule Take 1 capsule (100 mg total) by mouth 2 (two) times daily. 180 capsule 4  . aspirin 81 MG tablet Take 81 mg by mouth daily.    . baclofen (LIORESAL) 10 MG tablet Take 1 tablet (10 mg total) by mouth at bedtime. 30 each 3  . BD PEN NEEDLE NANO U/F 32G X 4 MM MISC Check sugar 2-3 times daily    . Blood Glucose Monitoring Suppl MISC Pt uses one touch verio flex. Please dispense test strips #100 and lancets #100. She tests her sugar 2-3x daily 100 each 3  . clonazePAM (KLONOPIN) 1 MG tablet     . cyclobenzaprine (FLEXERIL) 5 MG tablet Take 1 tablet (5 mg total) by mouth at bedtime as needed for muscle spasms. 30 tablet 5  . divalproex (DEPAKOTE ER) 250 MG 24 hr tablet Take 250 mg by mouth daily.    [name]  exemestane (AROMASIN) 25 MG tablet take 1 tablet by mouth once daily AFTER BREAKFAST 30 tablet 2  . furosemide (LASIX) 40 MG tablet take 1 tablet by mouth once daily 30 tablet 6  . gabapentin (NEURONTIN) 600 MG tablet Take 1 tablet (600 mg total) by mouth 4 (four) times daily. 120 tablet 11  . LANTUS SOLOSTAR 100 UNIT/ML Solostar Pen Take 48 units subcutaneously once daily.  Adjust as needed 15 mL 5  . metFORMIN (GLUCOPHAGE) 1000 MG tablet Take 1 tablet (1,000 mg total) by mouth 2 (two) times daily with a meal. 180 tablet 3  . mirtazapine (REMERON) 15 MG tablet Take 15 mg by mouth at bedtime.    . propranolol (INDERAL) 10 MG tablet take 1 tablet  by mouth twice a day 60 tablet 3  . simvastatin (ZOCOR) 40 MG tablet take 1 tablet by mouth every evening 30 tablet 6  . sitaGLIPtin (JANUVIA) 100 MG tablet Take 1 tablet (100 mg total) by mouth daily. 30 tablet 5   No current facility-administered medications on file prior to visit.     Allergies: No Known Allergies  Review of Systems:  CONSTITUTIONAL: No fevers, chills, night sweats, or weight loss.  EYES: No visual changes or eye pain ENT: No hearing changes.  No history of nose bleeds.   RESPIRATORY: No cough, wheezing and shortness of breath.   CARDIOVASCULAR: Negative for chest pain, and palpitations.   GI: Negative for abdominal discomfort, blood in stools or black stools.  No recent change in bowel habits.   GU:  No history of incontinence.   MUSCLOSKELETAL: +history of joint pain or swelling.  No myalgias.   SKIN: Negative for lesions, rash, and itching.   ENDOCRINE: Negative for cold or heat intolerance, polydipsia or goiter.   PSYCH:  + depression or anxiety symptoms.   NEURO: As Above.   Vital Signs:  There were no vitals taken for this visit.  Neurological Exam: MENTAL STATUS including orientation to time, place, person, recent and remote memory, attention span and concentration, language, and fund of knowledge is normal.  Speech is not dysarthric.  CRANIAL NERVES:  Pupils equal round and reactive to light.  Normal conjugate, extra-ocular eye movements in all directions of gaze.  Face is symmetric. Palate elevates symmetrically.  Tongue is midline.  MOTOR:  Motor strength is 5/5 in all extremities.  Mild intention tremor of the hands bilaterally.  No pronator drift.  Tone is normal.   MSRs:  Reflexes are 2+/4 throughout, except absent patella and achilles reflexes.   SENSORY: Vibration is mildly reduced at the great toe bilaterally.    COORDINATION/GAIT: Mild bradykinesias with finger tapping with reduced amplitude bilaterally.  She is unable to rise from a chair  without using arms.  Gait is wide-based and stable, assisted with cane.  Data: Labs 05/09/2016:  TSH 0.67, vitamin B12, 258 SPEP with IFE no M protein Lab Results  Component Value Date   HGBA1C 14.6 (H) 01/02/2017   MRI lumbar spine 02/04/2015 at [facility], [place], [place]:  Mild multilevel degenerative changes and foraminal stenosis, broad-based discu bulge at L4-5 with mild canal stenosis and foraminal stenosis.  Stable as compared to 2015.   IMPRESSION/PLAN: 1.  Diabetic polyneuropathy affecting the legs, poorly controlled diabetes with last HbA1c trending up to 14.9, neuropathy is ***  - Continue gabapentin 600mg  QID  - Secondary causes for neuropathy were unrevealing.  She has mildly low vitamin B12 and was recommended to start OTC B12 1057mcg daily  -  Encouraged her to maintain tight control of her diabetes  2.  Chronic low back pain due to multilevel degenerative changes and disc bulge at L4-5 -  ***  - No benefit with zanaflex, flexeril  - Continue baclofen 10mg  at bedtime  - PT declined  3.  Gait abnormality due to #1, #2, and morbid obesity  4.  Tremor, ?essential vs. side effects from depakote although it is very low dose  - Continue propranolol 10mg  twice daily and add extra dose as needed  5.  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    5.  Right breast cancer s/p lumpectomy and radiation  Return to clinic in 6 months  Greater than 50% of this *** minute visit was spent in counseling, explanation of diagnosis, planning of further management, and coordination of care ***due to the nature of their ***.    Thank you for allowing me to participate in patient's care.  If I can answer any additional questions, I would be pleased to do so.    Sincerely,    Rockford Leinen K. Posey Pronto, DO

## 2017-05-19 ENCOUNTER — Other Ambulatory Visit: Payer: Self-pay | Admitting: Family Medicine

## 2017-07-03 ENCOUNTER — Ambulatory Visit: Payer: Self-pay | Admitting: Hematology

## 2017-07-03 ENCOUNTER — Other Ambulatory Visit: Payer: Self-pay

## 2017-07-03 ENCOUNTER — Telehealth: Payer: Self-pay | Admitting: *Deleted

## 2017-07-03 NOTE — Telephone Encounter (Signed)
Pt was  NO  SHOW  For appts today.   Called pt and left message on voice mail requesting a call back to reschedule appts.

## 2018-01-10 IMAGING — MG DIAGNOSTIC NEEDLE LOC MAMMO RIGHT
2 series · 2 of 2 positions shown · non-contrast
Comparison: Previous exam(s).

CLINICAL DATA: Preoperative right breast radioactive seed
localization.

EXAM:
MAMMOGRAPHIC GUIDED RADIOACTIVE SEED LOCALIZATION OF THE RIGHT
BREAST

[R ML]
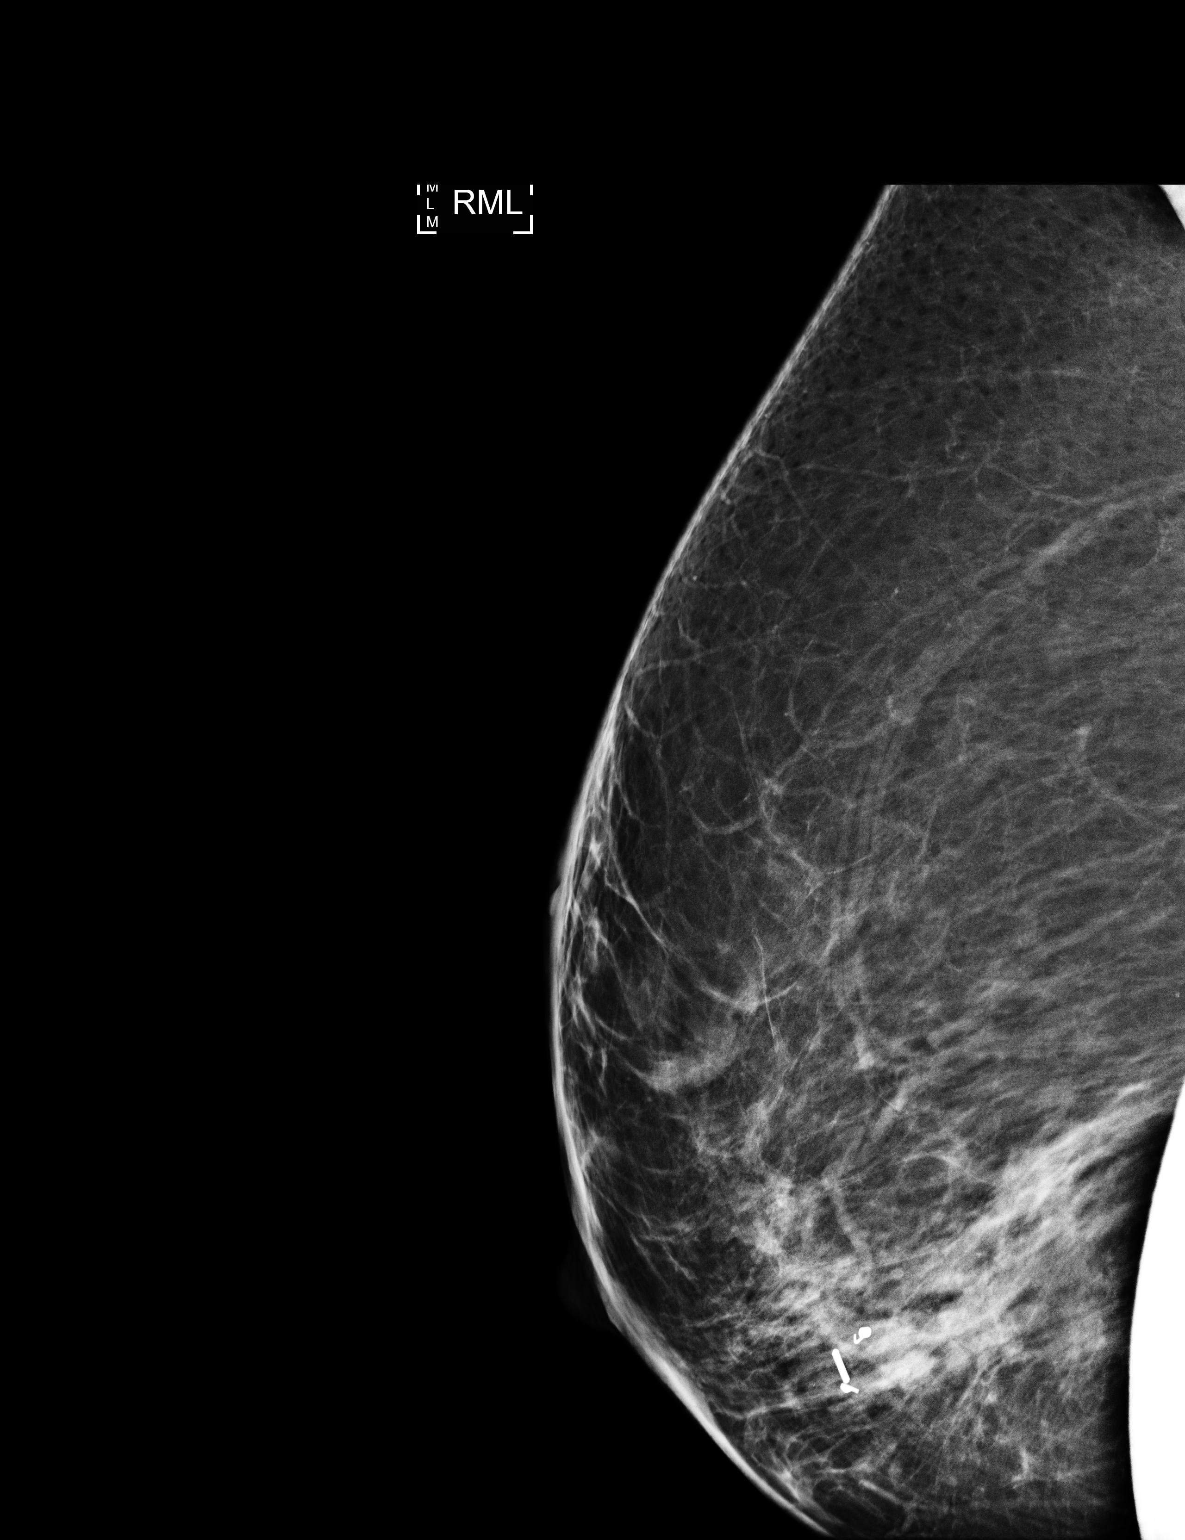

[R CC]
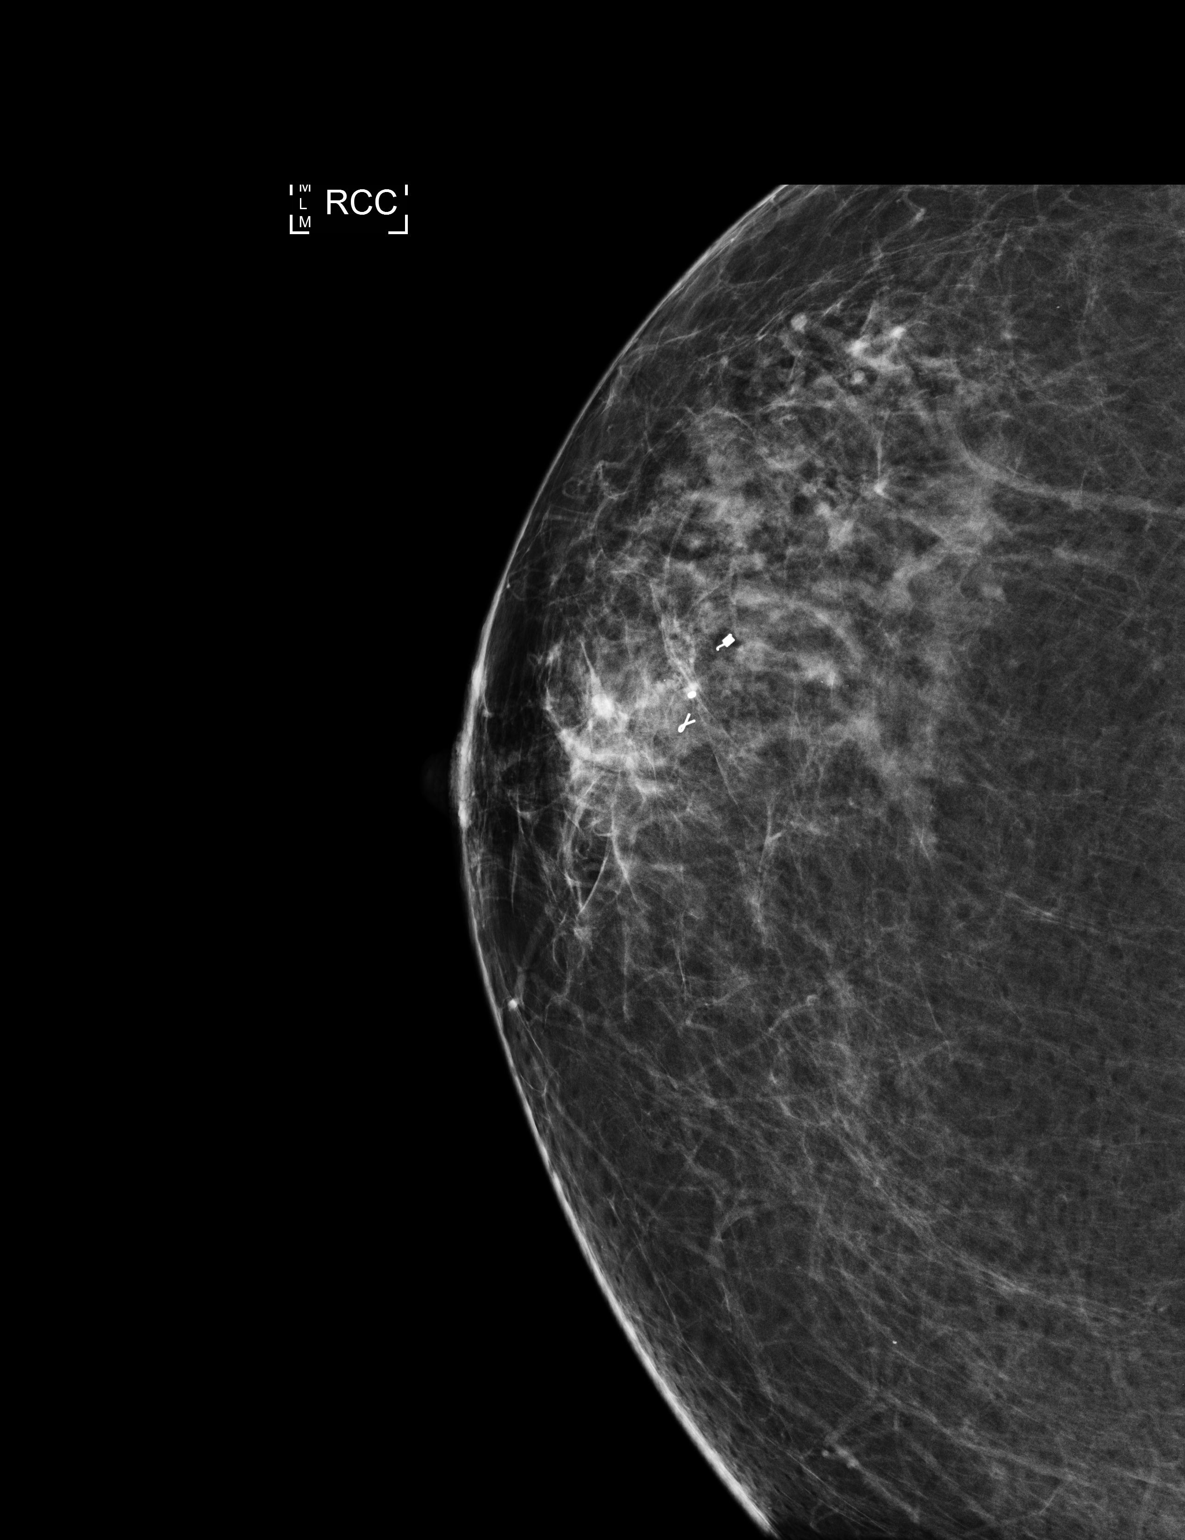

[2 of 2 positions shown; findings below may reference images not displayed]

FINDINGS: Patient presents for radioactive seed localization prior to right
breast lumpectomy. I met with the patient and we discussed the
procedure of seed localization including benefits and alternatives.
We discussed the high likelihood of a successful procedure. We
discussed the risks of the procedure including infection, bleeding,
tissue injury and further surgery. We discussed the low dose of
radioactivity involved in the procedure. Informed, written consent
was given.

The usual time-out protocol was performed immediately prior to the
procedure.

Using mammographic guidance, sterile technique, 1% lidocaine and an
3-4IE radioactive seed, 2 post biopsy marker clips were localized
with the seed placed between the two using a superior approach. The
follow-up mammogram images confirm the seed in the expected location
and were marked for Dr. Isique.

Follow-up survey of the patient confirms presence of the radioactive
seed.

Order number of 3-4IE seed:  [id number].

Total activity: 0.252 millicurie Reference Date: [REDACTED]

The patient tolerated the procedure well and was released from the
[REDACTED]. She was given instructions regarding seed removal.
IMPRESSION: Radioactive seed localization of right breast breast. No apparent
complications.

## 2018-07-24 ENCOUNTER — Telehealth: Payer: Self-pay | Admitting: Hematology

## 2018-07-24 NOTE — Telephone Encounter (Signed)
Faxed medical records to [facility]. Release HO#[id number]

## 2020-06-09 ENCOUNTER — Encounter: Payer: Self-pay | Admitting: Genetic Counselor

## 2020-06-09 NOTE — Progress Notes (Signed)
UPDATE: TERT c.779G>A (p.Gly260Asp)  VUS has been reclassified as Benign.  The updated report date is June 07, 2020.
